# Patient Record
Sex: Male | Born: 1940 | Race: Black or African American | Hispanic: No | Smoking: Never smoker
Health system: Southern US, Community
[De-identification: ages and names within clinical notes are randomized; demographics above are authoritative.]

## PROBLEM LIST (undated history)

## (undated) DIAGNOSIS — I639 Cerebral infarction, unspecified: Secondary | ICD-10-CM

## (undated) DIAGNOSIS — I10 Essential (primary) hypertension: Secondary | ICD-10-CM

## (undated) DIAGNOSIS — R569 Unspecified convulsions: Secondary | ICD-10-CM

## (undated) DIAGNOSIS — E785 Hyperlipidemia, unspecified: Secondary | ICD-10-CM

## (undated) DIAGNOSIS — I499 Cardiac arrhythmia, unspecified: Secondary | ICD-10-CM

## (undated) DIAGNOSIS — E119 Type 2 diabetes mellitus without complications: Secondary | ICD-10-CM

## (undated) HISTORY — PX: CORONARY ANGIOPLASTY WITH STENT PLACEMENT: SHX49

## (undated) HISTORY — PX: LOOP RECORDER IMPLANT: SHX5954

---

## 1998-09-30 ENCOUNTER — Encounter: Payer: Self-pay | Admitting: Neurosurgery

## 1998-09-30 ENCOUNTER — Ambulatory Visit (HOSPITAL_COMMUNITY): Admission: RE | Admit: 1998-09-30 | Discharge: 1998-09-30 | Payer: Self-pay | Admitting: Neurosurgery

## 1998-11-21 ENCOUNTER — Encounter: Payer: Self-pay | Admitting: Neurosurgery

## 1998-11-25 ENCOUNTER — Encounter: Payer: Self-pay | Admitting: Neurosurgery

## 1998-11-25 ENCOUNTER — Inpatient Hospital Stay (HOSPITAL_COMMUNITY): Admission: RE | Admit: 1998-11-25 | Discharge: 1998-11-26 | Payer: Self-pay | Admitting: Neurosurgery

## 1999-12-12 ENCOUNTER — Encounter: Payer: Self-pay | Admitting: Neurosurgery

## 1999-12-12 ENCOUNTER — Encounter: Admission: RE | Admit: 1999-12-12 | Discharge: 1999-12-12 | Payer: Self-pay | Admitting: Neurosurgery

## 2020-11-19 ENCOUNTER — Other Ambulatory Visit: Payer: Self-pay

## 2020-11-19 ENCOUNTER — Emergency Department (HOSPITAL_BASED_OUTPATIENT_CLINIC_OR_DEPARTMENT_OTHER): Payer: Medicare Other

## 2020-11-19 ENCOUNTER — Inpatient Hospital Stay (HOSPITAL_BASED_OUTPATIENT_CLINIC_OR_DEPARTMENT_OTHER)
Admission: EM | Admit: 2020-11-19 | Discharge: 2020-11-25 | DRG: 309 | Disposition: A | Discharge status: Home / Self Care | Payer: Medicare Other | Attending: Internal Medicine | Admitting: Internal Medicine

## 2020-11-19 ENCOUNTER — Encounter (HOSPITAL_BASED_OUTPATIENT_CLINIC_OR_DEPARTMENT_OTHER): Payer: Self-pay | Admitting: *Deleted

## 2020-11-19 DIAGNOSIS — R072 Precordial pain: Secondary | ICD-10-CM

## 2020-11-19 DIAGNOSIS — Z981 Arthrodesis status: Secondary | ICD-10-CM

## 2020-11-19 DIAGNOSIS — E1165 Type 2 diabetes mellitus with hyperglycemia: Secondary | ICD-10-CM | POA: Diagnosis present

## 2020-11-19 DIAGNOSIS — Z7984 Long term (current) use of oral hypoglycemic drugs: Secondary | ICD-10-CM

## 2020-11-19 DIAGNOSIS — Z20822 Contact with and (suspected) exposure to covid-19: Secondary | ICD-10-CM | POA: Diagnosis present

## 2020-11-19 DIAGNOSIS — E785 Hyperlipidemia, unspecified: Secondary | ICD-10-CM | POA: Diagnosis present

## 2020-11-19 DIAGNOSIS — I509 Heart failure, unspecified: Secondary | ICD-10-CM | POA: Insufficient documentation

## 2020-11-19 DIAGNOSIS — Z7901 Long term (current) use of anticoagulants: Secondary | ICD-10-CM

## 2020-11-19 DIAGNOSIS — I4892 Unspecified atrial flutter: Secondary | ICD-10-CM | POA: Diagnosis present

## 2020-11-19 DIAGNOSIS — I129 Hypertensive chronic kidney disease with stage 1 through stage 4 chronic kidney disease, or unspecified chronic kidney disease: Secondary | ICD-10-CM | POA: Diagnosis present

## 2020-11-19 DIAGNOSIS — E1122 Type 2 diabetes mellitus with diabetic chronic kidney disease: Secondary | ICD-10-CM | POA: Diagnosis present

## 2020-11-19 DIAGNOSIS — E119 Type 2 diabetes mellitus without complications: Secondary | ICD-10-CM

## 2020-11-19 DIAGNOSIS — Z79899 Other long term (current) drug therapy: Secondary | ICD-10-CM

## 2020-11-19 DIAGNOSIS — G40909 Epilepsy, unspecified, not intractable, without status epilepticus: Secondary | ICD-10-CM | POA: Diagnosis present

## 2020-11-19 DIAGNOSIS — I484 Atypical atrial flutter: Secondary | ICD-10-CM | POA: Diagnosis not present

## 2020-11-19 DIAGNOSIS — D631 Anemia in chronic kidney disease: Secondary | ICD-10-CM | POA: Diagnosis present

## 2020-11-19 DIAGNOSIS — Z955 Presence of coronary angioplasty implant and graft: Secondary | ICD-10-CM

## 2020-11-19 DIAGNOSIS — I4891 Unspecified atrial fibrillation: Secondary | ICD-10-CM

## 2020-11-19 DIAGNOSIS — N1831 Chronic kidney disease, stage 3a: Secondary | ICD-10-CM | POA: Diagnosis present

## 2020-11-19 DIAGNOSIS — R569 Unspecified convulsions: Secondary | ICD-10-CM

## 2020-11-19 DIAGNOSIS — I2510 Atherosclerotic heart disease of native coronary artery without angina pectoris: Secondary | ICD-10-CM

## 2020-11-19 DIAGNOSIS — Z8673 Personal history of transient ischemic attack (TIA), and cerebral infarction without residual deficits: Secondary | ICD-10-CM

## 2020-11-19 DIAGNOSIS — Q211 Atrial septal defect: Secondary | ICD-10-CM

## 2020-11-19 DIAGNOSIS — Z87891 Personal history of nicotine dependence: Secondary | ICD-10-CM

## 2020-11-19 DIAGNOSIS — I872 Venous insufficiency (chronic) (peripheral): Secondary | ICD-10-CM | POA: Diagnosis present

## 2020-11-19 DIAGNOSIS — I10 Essential (primary) hypertension: Secondary | ICD-10-CM | POA: Diagnosis present

## 2020-11-19 DIAGNOSIS — N400 Enlarged prostate without lower urinary tract symptoms: Secondary | ICD-10-CM | POA: Diagnosis present

## 2020-11-19 HISTORY — DX: Essential (primary) hypertension: I10

## 2020-11-19 HISTORY — DX: Hyperlipidemia, unspecified: E78.5

## 2020-11-19 HISTORY — DX: Unspecified convulsions: R56.9

## 2020-11-19 HISTORY — DX: Cerebral infarction, unspecified: I63.9

## 2020-11-19 HISTORY — DX: Type 2 diabetes mellitus without complications: E11.9

## 2020-11-19 HISTORY — DX: Cardiac arrhythmia, unspecified: I49.9

## 2020-11-19 LAB — URINALYSIS, ROUTINE W REFLEX MICROSCOPIC
Bilirubin Urine: NEGATIVE
Glucose, UA: >=500 mg/dL — AB
Hgb urine dipstick: NEGATIVE
Ketones, ur: NEGATIVE mg/dL
Leukocytes,Ua: NEGATIVE
Nitrite: NEGATIVE
Protein, ur: NEGATIVE mg/dL
Specific Gravity, Urine: 1.02 (ref 1.005–1.030)
pH: 5.5 (ref 5.0–8.0)

## 2020-11-19 LAB — HEPATIC FUNCTION PANEL
ALT: 29 U/L (ref 0–44)
AST: 30 U/L (ref 15–41)
Albumin: 3.4 g/dL — ABNORMAL LOW (ref 3.5–5.0)
Alkaline Phosphatase: 132 U/L — ABNORMAL HIGH (ref 38–126)
Bilirubin, Direct: 0.2 mg/dL (ref 0.0–0.2)
Indirect Bilirubin: 0.8 mg/dL (ref 0.3–0.9)
Total Bilirubin: 1 mg/dL (ref 0.3–1.2)
Total Protein: 7.3 g/dL (ref 6.5–8.1)

## 2020-11-19 LAB — PROTIME-INR
INR: 1.1 (ref 0.8–1.2)
Prothrombin Time: 14 seconds (ref 11.4–15.2)

## 2020-11-19 LAB — URINALYSIS, MICROSCOPIC (REFLEX)

## 2020-11-19 LAB — CBC
HCT: 33.7 % — ABNORMAL LOW (ref 39.0–52.0)
Hemoglobin: 11.6 g/dL — ABNORMAL LOW (ref 13.0–17.0)
MCH: 32 pg (ref 26.0–34.0)
MCHC: 34.4 g/dL (ref 30.0–36.0)
MCV: 92.8 fL (ref 80.0–100.0)
Platelets: 386 10*3/uL (ref 150–400)
RBC: 3.63 MIL/uL — ABNORMAL LOW (ref 4.22–5.81)
RDW: 13.3 % (ref 11.5–15.5)
WBC: 19.3 10*3/uL — ABNORMAL HIGH (ref 4.0–10.5)
nRBC: 0 % (ref 0.0–0.2)

## 2020-11-19 LAB — TROPONIN I (HIGH SENSITIVITY)
Troponin I (High Sensitivity): 46 ng/L — ABNORMAL HIGH (ref <18)
Troponin I (High Sensitivity): 48 ng/L — ABNORMAL HIGH (ref <18)

## 2020-11-19 LAB — BASIC METABOLIC PANEL
Anion gap: 12 (ref 5–15)
BUN: 19 mg/dL (ref 8–23)
CO2: 22 mmol/L (ref 22–32)
Calcium: 9.5 mg/dL (ref 8.9–10.3)
Chloride: 100 mmol/L (ref 98–111)
Creatinine, Ser: 1.53 mg/dL — ABNORMAL HIGH (ref 0.61–1.24)
GFR, Estimated: 45 mL/min — ABNORMAL LOW (ref >60)
Glucose, Bld: 280 mg/dL — ABNORMAL HIGH (ref 70–99)
Potassium: 3.6 mmol/L (ref 3.5–5.1)
Sodium: 134 mmol/L — ABNORMAL LOW (ref 135–145)

## 2020-11-19 LAB — TSH: TSH: 2.299 u[IU]/mL (ref 0.350–4.500)

## 2020-11-19 LAB — RESP PANEL BY RT-PCR (FLU A&B, COVID) ARPGX2
Influenza A by PCR: NEGATIVE
Influenza B by PCR: NEGATIVE
SARS Coronavirus 2 by RT PCR: NEGATIVE

## 2020-11-19 LAB — LIPASE, BLOOD: Lipase: 36 U/L (ref 11–51)

## 2020-11-19 LAB — BRAIN NATRIURETIC PEPTIDE: B Natriuretic Peptide: 897.2 pg/mL — ABNORMAL HIGH (ref 0.0–100.0)

## 2020-11-19 LAB — LACTIC ACID, PLASMA
Lactic Acid, Venous: 1.7 mmol/L (ref 0.5–1.9)
Lactic Acid, Venous: 1.6 mmol/L (ref 0.5–1.9)

## 2020-11-19 MED ORDER — NITROGLYCERIN 2 % TD OINT
1.0000 [in_us] | TOPICAL_OINTMENT | Freq: Four times a day (QID) | TRANSDERMAL | Status: DC
  Administered 2020-11-19 – 2020-11-23 (×12): 1 [in_us] via TOPICAL
  Filled 2020-11-19: qty 1
  Filled 2020-11-19: qty 30

## 2020-11-19 MED ORDER — FUROSEMIDE 10 MG/ML IJ SOLN
40.0000 mg | Freq: Once | INTRAMUSCULAR | Status: AC
  Administered 2020-11-19: 40 mg via INTRAVENOUS
  Filled 2020-11-19: qty 4

## 2020-11-19 NOTE — ED Triage Notes (Signed)
He had a seizure in February while in [place]. He also had an irregular heart beat. He had a 'loop'. The loop was hooked up for the first time today. The company told him his heart beat was irregular and he needed to come to the ER. He has had fatigue x 2 days.

## 2020-11-19 NOTE — ED Provider Notes (Signed)
Phillip Oconnor Note   CSN: [id number] Arrival date & time: 11/19/20  1445     History Chief Complaint  Patient presents with  . Fatigue  . Irregular Heart Beat    Phillip Oconnor is a 80 y.o. male.  HPI Patient just got a loop recorder activated that has actually gotten placed 3 months ago.  He got a call today telling him that heart was doing irregular things and he was to get medical care.  Patient had rhythm consistent with atrial fibrillation.  He does not know of any history of this.  He is not on anticoagulants.  Patient reports that he had a 15-day hospitalization in [place] in February.  He was visiting some friends and had a seizure.  He does not know know what the seizure was ever attributed to.  He denied prior history of seizures.  He reports by the time he finally left the hospital however they had placed this loop recorder but it was not activated until day before yesterday.  He reports he had no instructions on what to do with that and so finally he called and he was walked-through starting it up.  He reports has been feeling pretty good otherwise.  He reports he gets short of breath fairly easily but that is not unusual for him.  He reports he fatigues as well.  He reports his legs swell over the course of the day.  He reports they go down when he sleeps at night.  He reports in the morning he does not have any foot swelling.  He does wear his compression hose.  He denies any experiences any pain in his legs or feet.  He reports this has been a chronic problem.    Past Medical History:  Diagnosis Date  . Irregular heart beat   . Seizures ([place])     There are no problems to display for this patient.   Past Surgical History:  Procedure Laterality Date  . CORONARY ANGIOPLASTY WITH STENT PLACEMENT    . LOOP RECORDER IMPLANT         No family history on file.  Social History   Tobacco Use  . Smoking status: Never Smoker  .  Smokeless tobacco: Never Used  Substance Use Topics  . Alcohol use: Never  . Drug use: Never    Home Medications Prior to Admission medications   Medication Sig Start Date End Date Taking? Authorizing Oconnor  aspirin 325 MG tablet Take 1 tablet by mouth daily. 10/29/20  Yes Oconnor, Historical, MD  atorvastatin (LIPITOR) 40 MG tablet Take 1 tablet by mouth daily. 10/29/20  Yes Oconnor, Historical, MD  doxazosin (CARDURA) 4 MG tablet Take by mouth. 10/29/20  Yes Oconnor, Historical, MD  levETIRAcetam (KEPPRA) 500 MG tablet Take by mouth. 10/29/20  Yes Oconnor, Historical, MD  linagliptin (TRADJENTA) 5 MG TABS tablet Take 1 tablet by mouth daily. 11/01/20  Yes Oconnor, Historical, MD  losartan (COZAAR) 100 MG tablet Take 1 tablet by mouth daily. 10/29/20  Yes Oconnor, Historical, MD  metFORMIN (GLUCOPHAGE) 500 MG tablet Take by mouth. 10/29/20  Yes Oconnor, Historical, MD  NIFEdipine (PROCARDIA XL/NIFEDICAL-XL) 90 MG 24 hr tablet Take by mouth. 10/29/20  Yes Oconnor, Historical, MD  hydrALAZINE (APRESOLINE) 100 MG tablet Take by mouth. 10/29/20   Oconnor, Historical, MD    Allergies    Penicillins and Plavix [clopidogrel]  Review of Systems   Review of Systems 10 systems reviewed and negative  except as per HPI Physical Exam Updated Vital Signs BP (!) 164/73 (BP Location: Left Arm)   Pulse (!) 56   Temp 98.9 F (37.2 C) (Oral)   Resp 20   Ht 6\' 4"  (1.93 m)   Wt 98.3 kg   SpO2 97%   BMI 26.38 kg/m   Physical Exam Constitutional:      Comments: Alert nontoxic.  Clear mental status.  No respiratory distress at rest  HENT:     Head: Normocephalic and atraumatic.     Mouth/Throat:     Mouth: Mucous membranes are moist.     Pharynx: Oropharynx is clear.  Eyes:     Extraocular Movements: Extraocular movements intact.     Conjunctiva/sclera: Conjunctivae normal.     Pupils: Pupils are equal, round, and reactive to light.  Cardiovascular:     Comments: Heart rate is  irregular, borderline bradycardia Pulmonary:     Effort: Pulmonary effort is normal.     Breath sounds: Normal breath sounds.  Abdominal:     General: There is no distension.     Palpations: Abdomen is soft.     Tenderness: There is no abdominal tenderness. There is no guarding.  Musculoskeletal:     Comments: 2+ pitting edema bilateral lower extremities.  Nontender.  Skin:    General: Skin is warm and dry.  Neurological:     General: No focal deficit present.     Mental Status: He is oriented to person, place, and time.     Motor: No weakness.     Coordination: Coordination normal.  Psychiatric:        Mood and Affect: Mood normal.     ED Results / Procedures / Treatments   Labs (all labs ordered are listed, but only abnormal results are displayed) Labs Reviewed  BASIC METABOLIC PANEL - Abnormal; Notable for the following components:      Result Value   Sodium 134 (*)    Glucose, Bld 280 (*)    Creatinine, Ser 1.53 (*)    GFR, Estimated 45 (*)    All other components within normal limits  CBC - Abnormal; Notable for the following components:   WBC 19.3 (*)    RBC 3.63 (*)    Hemoglobin 11.6 (*)    HCT 33.7 (*)    All other components within normal limits  RESP PANEL BY RT-PCR (FLU A&B, COVID) ARPGX2  PROTIME-INR  TSH  URINALYSIS, ROUTINE W REFLEX MICROSCOPIC  LACTIC ACID, PLASMA  LACTIC ACID, PLASMA  BRAIN NATRIURETIC PEPTIDE  HEPATIC FUNCTION PANEL  LIPASE, BLOOD  TROPONIN I (HIGH SENSITIVITY)    EKG EKG Interpretation  Date/Time:  Tuesday November 19 2020 15:06:52 EDT Ventricular Rate:  65 PR Interval:    QRS Duration: 120 QT Interval:  482 QTC Calculation: 501 R Axis:   -79 Text Interpretation: Atrial flutter with variable A-V block Pulmonary disease pattern Right bundle branch block Left anterior fascicular block Abnormal ECG Confirmed by Charlesetta Shanks [phone]) on 11/19/2020 4:32:50 PM   Radiology No results found.  Procedures Procedures  CRITICAL  CARE Performed by: Charlesetta Shanks   Total critical care time: 33  minutes  Critical care time was exclusive of separately billable procedures and treating other patients.  Critical care was necessary to treat or prevent imminent or life-threatening deterioration.  Critical care was time spent personally by me on the following activities: development of treatment plan with patient and/or surrogate as well as nursing, discussions with consultants, evaluation of  patient's response to treatment, examination of patient, obtaining history from patient or surrogate, ordering and performing treatments and interventions, ordering and review of laboratory studies, ordering and review of radiographic studies, pulse oximetry and re-evaluation of patient's condition. Medications Ordered in ED Medications - No data to display  ED Course  I have reviewed the triage vital signs and the nursing notes.  Pertinent labs & imaging results that were available during my care of the patient were reviewed by me and considered in my medical decision making (see chart for details).    MDM Rules/Calculators/A&P                         Consult: Reviewed with cardiology Dr. Virgina Jock.  We reviewed patient's history of admission in [place] for seizure with unclear etiology.  At this time plan will be to do CT head and rule out any acute findings on CT head.  Will then proceed with heparin and admission to hospitalist service.  Cardiology will do consultation to further manage new onset atrial fibrillation and CHF.  Patient presents as outlined above.  He is alert.  He has mild increased work of breathing.  Chest x-ray shows effusions consistent with CHF.  He also has peripheral edema.  At this time, findings consistent with acute CHF.  Patient denies any known history of CHF.  Unclear chronicity of the atrial fibrillation.  From history this is a new diagnosis for the patient.  We will proceed with Lasix, heparin and  rate and blood pressure control as needed.  Final Clinical Impression(s) / ED Diagnoses Final diagnoses:  Atrial fibrillation, unspecified type ([place])  Acute congestive heart failure, unspecified heart failure type [facility])    Rx / [place] Orders ED Discharge Orders    None       Charlesetta Shanks, MD 11/27/20 1248

## 2020-11-20 ENCOUNTER — Other Ambulatory Visit (HOSPITAL_COMMUNITY): Payer: Self-pay

## 2020-11-20 ENCOUNTER — Observation Stay (HOSPITAL_COMMUNITY): Payer: Medicare Other

## 2020-11-20 ENCOUNTER — Encounter (HOSPITAL_BASED_OUTPATIENT_CLINIC_OR_DEPARTMENT_OTHER): Payer: Self-pay | Admitting: Internal Medicine

## 2020-11-20 DIAGNOSIS — Z981 Arthrodesis status: Secondary | ICD-10-CM | POA: Diagnosis not present

## 2020-11-20 DIAGNOSIS — I10 Essential (primary) hypertension: Secondary | ICD-10-CM | POA: Diagnosis present

## 2020-11-20 DIAGNOSIS — I4891 Unspecified atrial fibrillation: Secondary | ICD-10-CM

## 2020-11-20 DIAGNOSIS — I4892 Unspecified atrial flutter: Secondary | ICD-10-CM | POA: Diagnosis not present

## 2020-11-20 DIAGNOSIS — I509 Heart failure, unspecified: Secondary | ICD-10-CM

## 2020-11-20 DIAGNOSIS — E1159 Type 2 diabetes mellitus with other circulatory complications: Secondary | ICD-10-CM | POA: Diagnosis not present

## 2020-11-20 DIAGNOSIS — R569 Unspecified convulsions: Secondary | ICD-10-CM

## 2020-11-20 DIAGNOSIS — Z7984 Long term (current) use of oral hypoglycemic drugs: Secondary | ICD-10-CM | POA: Diagnosis not present

## 2020-11-20 DIAGNOSIS — Q211 Atrial septal defect: Secondary | ICD-10-CM | POA: Diagnosis not present

## 2020-11-20 DIAGNOSIS — E1165 Type 2 diabetes mellitus with hyperglycemia: Secondary | ICD-10-CM | POA: Diagnosis present

## 2020-11-20 DIAGNOSIS — E119 Type 2 diabetes mellitus without complications: Secondary | ICD-10-CM

## 2020-11-20 DIAGNOSIS — I2510 Atherosclerotic heart disease of native coronary artery without angina pectoris: Secondary | ICD-10-CM | POA: Diagnosis present

## 2020-11-20 DIAGNOSIS — Z8673 Personal history of transient ischemic attack (TIA), and cerebral infarction without residual deficits: Secondary | ICD-10-CM | POA: Diagnosis not present

## 2020-11-20 DIAGNOSIS — I872 Venous insufficiency (chronic) (peripheral): Secondary | ICD-10-CM | POA: Diagnosis present

## 2020-11-20 DIAGNOSIS — E785 Hyperlipidemia, unspecified: Secondary | ICD-10-CM | POA: Diagnosis present

## 2020-11-20 DIAGNOSIS — I129 Hypertensive chronic kidney disease with stage 1 through stage 4 chronic kidney disease, or unspecified chronic kidney disease: Secondary | ICD-10-CM | POA: Diagnosis present

## 2020-11-20 DIAGNOSIS — Z79899 Other long term (current) drug therapy: Secondary | ICD-10-CM | POA: Diagnosis not present

## 2020-11-20 DIAGNOSIS — N1831 Chronic kidney disease, stage 3a: Secondary | ICD-10-CM | POA: Diagnosis present

## 2020-11-20 DIAGNOSIS — I484 Atypical atrial flutter: Secondary | ICD-10-CM | POA: Diagnosis present

## 2020-11-20 DIAGNOSIS — D631 Anemia in chronic kidney disease: Secondary | ICD-10-CM | POA: Diagnosis present

## 2020-11-20 DIAGNOSIS — Z20822 Contact with and (suspected) exposure to covid-19: Secondary | ICD-10-CM | POA: Diagnosis present

## 2020-11-20 DIAGNOSIS — E1122 Type 2 diabetes mellitus with diabetic chronic kidney disease: Secondary | ICD-10-CM | POA: Diagnosis present

## 2020-11-20 DIAGNOSIS — G40909 Epilepsy, unspecified, not intractable, without status epilepticus: Secondary | ICD-10-CM | POA: Diagnosis present

## 2020-11-20 DIAGNOSIS — N400 Enlarged prostate without lower urinary tract symptoms: Secondary | ICD-10-CM | POA: Diagnosis present

## 2020-11-20 DIAGNOSIS — Z87891 Personal history of nicotine dependence: Secondary | ICD-10-CM | POA: Diagnosis not present

## 2020-11-20 DIAGNOSIS — Z955 Presence of coronary angioplasty implant and graft: Secondary | ICD-10-CM | POA: Diagnosis not present

## 2020-11-20 LAB — CBC WITH DIFFERENTIAL/PLATELET
Abs Immature Granulocytes: 0.09 10*3/uL — ABNORMAL HIGH (ref 0.00–0.07)
Basophils Absolute: 0 10*3/uL (ref 0.0–0.1)
Basophils Relative: 0 %
Eosinophils Absolute: 0.1 10*3/uL (ref 0.0–0.5)
Eosinophils Relative: 0 %
HCT: 28.4 % — ABNORMAL LOW (ref 39.0–52.0)
Hemoglobin: 9.6 g/dL — ABNORMAL LOW (ref 13.0–17.0)
Immature Granulocytes: 1 %
Lymphocytes Relative: 8 %
Lymphs Abs: 1.3 10*3/uL (ref 0.7–4.0)
MCH: 31.3 pg (ref 26.0–34.0)
MCHC: 33.8 g/dL (ref 30.0–36.0)
MCV: 92.5 fL (ref 80.0–100.0)
Monocytes Absolute: 1.4 10*3/uL — ABNORMAL HIGH (ref 0.1–1.0)
Monocytes Relative: 8 %
Neutro Abs: 14.3 10*3/uL — ABNORMAL HIGH (ref 1.7–7.7)
Neutrophils Relative %: 83 %
Platelets: 349 10*3/uL (ref 150–400)
RBC: 3.07 MIL/uL — ABNORMAL LOW (ref 4.22–5.81)
RDW: 13.2 % (ref 11.5–15.5)
WBC: 17.2 10*3/uL — ABNORMAL HIGH (ref 4.0–10.5)
nRBC: 0 % (ref 0.0–0.2)

## 2020-11-20 LAB — COMPREHENSIVE METABOLIC PANEL
ALT: 21 U/L (ref 0–44)
AST: 20 U/L (ref 15–41)
Albumin: 2.6 g/dL — ABNORMAL LOW (ref 3.5–5.0)
Alkaline Phosphatase: 100 U/L (ref 38–126)
Anion gap: 10 (ref 5–15)
BUN: 13 mg/dL (ref 8–23)
CO2: 25 mmol/L (ref 22–32)
Calcium: 8.9 mg/dL (ref 8.9–10.3)
Chloride: 100 mmol/L (ref 98–111)
Creatinine, Ser: 1.42 mg/dL — ABNORMAL HIGH (ref 0.61–1.24)
GFR, Estimated: 50 mL/min — ABNORMAL LOW (ref >60)
Glucose, Bld: 250 mg/dL — ABNORMAL HIGH (ref 70–99)
Potassium: 3.4 mmol/L — ABNORMAL LOW (ref 3.5–5.1)
Sodium: 135 mmol/L (ref 135–145)
Total Bilirubin: 1 mg/dL (ref 0.3–1.2)
Total Protein: 5.6 g/dL — ABNORMAL LOW (ref 6.5–8.1)

## 2020-11-20 LAB — ECHOCARDIOGRAM COMPLETE
AR max vel: 2.56 cm2
AV Area VTI: 2.49 cm2
AV Area mean vel: 2.35 cm2
AV Mean grad: 3 mmHg
AV Peak grad: 5.8 mmHg
Ao pk vel: 1.2 m/s
Area-P 1/2: 4.21 cm2
Calc EF: 74.5 %
Height: 76 in
S' Lateral: 3 cm
Single Plane A2C EF: 72.2 %
Single Plane A4C EF: 76.6 %
Weight: 3291.03 oz

## 2020-11-20 LAB — HEMOGLOBIN A1C
Hgb A1c MFr Bld: 9.3 % — ABNORMAL HIGH (ref 4.8–5.6)
Mean Plasma Glucose: 220.21 mg/dL

## 2020-11-20 LAB — MAGNESIUM: Magnesium: 1.4 mg/dL — ABNORMAL LOW (ref 1.7–2.4)

## 2020-11-20 LAB — HEMOGLOBIN AND HEMATOCRIT, BLOOD
HCT: 28.4 % — ABNORMAL LOW (ref 39.0–52.0)
Hemoglobin: 9.8 g/dL — ABNORMAL LOW (ref 13.0–17.0)

## 2020-11-20 LAB — GLUCOSE, CAPILLARY
Glucose-Capillary: 230 mg/dL — ABNORMAL HIGH (ref 70–99)
Glucose-Capillary: 195 mg/dL — ABNORMAL HIGH (ref 70–99)
Glucose-Capillary: 293 mg/dL — ABNORMAL HIGH (ref 70–99)
Glucose-Capillary: 249 mg/dL — ABNORMAL HIGH (ref 70–99)

## 2020-11-20 LAB — HEPARIN LEVEL (UNFRACTIONATED)
Heparin Unfractionated: 0.27 IU/mL — ABNORMAL LOW (ref 0.30–0.70)
Heparin Unfractionated: 0.53 IU/mL (ref 0.30–0.70)

## 2020-11-20 MED ORDER — DOXAZOSIN MESYLATE 4 MG PO TABS
4.0000 mg | ORAL_TABLET | Freq: Every day | ORAL | Status: DC
  Administered 2020-11-20 – 2020-11-25 (×6): 4 mg via ORAL
  Filled 2020-11-20 (×6): qty 1

## 2020-11-20 MED ORDER — INSULIN ASPART 100 UNIT/ML ~~LOC~~ SOLN
0.0000 [IU] | Freq: Three times a day (TID) | SUBCUTANEOUS | Status: DC
  Administered 2020-11-20: 5 [IU] via SUBCUTANEOUS
  Administered 2020-11-20: 8 [IU] via SUBCUTANEOUS
  Administered 2020-11-20 – 2020-11-21 (×4): 3 [IU] via SUBCUTANEOUS
  Administered 2020-11-22: 2 [IU] via SUBCUTANEOUS
  Administered 2020-11-22: 3 [IU] via SUBCUTANEOUS
  Administered 2020-11-23: 2 [IU] via SUBCUTANEOUS
  Administered 2020-11-24: 5 [IU] via SUBCUTANEOUS
  Administered 2020-11-24: 3 [IU] via SUBCUTANEOUS
  Administered 2020-11-24: 8 [IU] via SUBCUTANEOUS
  Administered 2020-11-25 (×2): 3 [IU] via SUBCUTANEOUS

## 2020-11-20 MED ORDER — ATORVASTATIN CALCIUM 40 MG PO TABS
40.0000 mg | ORAL_TABLET | Freq: Every day | ORAL | Status: DC
  Filled 2020-11-20: qty 1

## 2020-11-20 MED ORDER — LEVETIRACETAM 500 MG PO TABS
500.0000 mg | ORAL_TABLET | Freq: Two times a day (BID) | ORAL | Status: DC
  Administered 2020-11-20 – 2020-11-25 (×12): 500 mg via ORAL
  Filled 2020-11-20 (×12): qty 1

## 2020-11-20 MED ORDER — MAGNESIUM SULFATE 4 GM/100ML IV SOLN: 4.0000 g | Freq: Once | INTRAVENOUS | Status: DC

## 2020-11-20 MED ORDER — ONDANSETRON HCL 4 MG/2ML IJ SOLN
4.0000 mg | Freq: Four times a day (QID) | INTRAMUSCULAR | Status: DC | PRN
Start: 2020-11-20 — End: 2020-11-25

## 2020-11-20 MED ORDER — POTASSIUM CHLORIDE CRYS ER 20 MEQ PO TBCR
40.0000 meq | EXTENDED_RELEASE_TABLET | Freq: Once | ORAL | Status: DC
  Filled 2020-11-20: qty 2

## 2020-11-20 MED ORDER — MAGNESIUM SULFATE 2 GM/50ML IV SOLN
2.0000 g | Freq: Once | INTRAVENOUS | Status: AC
  Administered 2020-11-20: 2 g via INTRAVENOUS
  Filled 2020-11-20: qty 50

## 2020-11-20 MED ORDER — NIFEDIPINE ER OSMOTIC RELEASE 90 MG PO TB24
90.0000 mg | ORAL_TABLET | Freq: Every day | ORAL | Status: DC
  Administered 2020-11-20: 90 mg via ORAL
  Filled 2020-11-20: qty 1

## 2020-11-20 MED ORDER — FUROSEMIDE 10 MG/ML IJ SOLN: 40.0000 mg | Freq: Every day | INTRAMUSCULAR | Status: DC

## 2020-11-20 MED ORDER — ACETAMINOPHEN 325 MG PO TABS: 650.0000 mg | ORAL_TABLET | ORAL | Status: DC | PRN

## 2020-11-20 MED ORDER — METOPROLOL SUCCINATE ER 25 MG PO TB24: 25.0000 mg | ORAL_TABLET | Freq: Every day | ORAL | Status: DC

## 2020-11-20 MED ORDER — HYDRALAZINE HCL 20 MG/ML IJ SOLN
10.0000 mg | INTRAMUSCULAR | Status: DC | PRN
  Administered 2020-11-20: 20 mg via INTRAVENOUS
  Filled 2020-11-20: qty 1

## 2020-11-20 MED ORDER — LOSARTAN POTASSIUM 50 MG PO TABS
100.0000 mg | ORAL_TABLET | Freq: Every day | ORAL | Status: DC
  Administered 2020-11-20: 100 mg via ORAL
  Filled 2020-11-20: qty 2

## 2020-11-20 MED ORDER — HEPARIN (PORCINE) 25000 UT/250ML-% IV SOLN
1550.0000 [IU]/h | INTRAVENOUS | Status: DC
  Administered 2020-11-20: 1500 [IU]/h via INTRAVENOUS
  Administered 2020-11-21 – 2020-11-22 (×2): 1750 [IU]/h via INTRAVENOUS
  Filled 2020-11-20 (×4): qty 250

## 2020-11-20 MED ORDER — HEPARIN BOLUS VIA INFUSION
3000.0000 [IU] | Freq: Once | INTRAVENOUS | Status: AC
  Administered 2020-11-20: 3000 [IU] via INTRAVENOUS
  Filled 2020-11-20: qty 3000

## 2020-11-20 MED ORDER — ATORVASTATIN CALCIUM 80 MG PO TABS
80.0000 mg | ORAL_TABLET | Freq: Every day | ORAL | Status: DC
  Administered 2020-11-20 – 2020-11-25 (×6): 80 mg via ORAL
  Filled 2020-11-20 (×6): qty 1

## 2020-11-20 NOTE — Progress Notes (Signed)
Phillip Oconnor is a 80 y.o. male with medical history significant of DM2, HTN, HLD. Presented to [facility] ED after receiving a call that his loop recorder was showing A. fib and that he needed to come to the hospital.  He drove himself to [facility].  He had recently been treated for urinary tract infection, completed his 10-day course of oral antibiotics on Saturday, 11/17/2020, was otherwise in his usual state of health.  Upon presentation to the ED, twelve-lead EKG showing a flutter, BNP elevated 800, small bilateral pleural effusions on chest x-ray.  Received 1 dose of IV Lasix 40 mg.  Cardiology was consulted by EDP.  Hospitalist team asked to admit.  11/20/20: [name]  He denies having any palpitations or chest pain or shortness of breath while at rest.  He is currently on heparin drip.  Rate is controlled.  Please refer to H&P dictated by my partner Dr. Alcario Drought on 11/20/2020 for further details of the assessment and plan.

## 2020-11-20 NOTE — Progress Notes (Signed)
ANTICOAGULATION CONSULT NOTE - Initial Consult  Pharmacy Consult for heparin Indication: atrial fibrillation  Allergies  Allergen Reactions  . Penicillins Rash and Swelling    Lips swell  . Plavix [Clopidogrel]     unknown    Patient Measurements: Height: 6\' 4"  (193 cm) Weight: 98.3 kg (216 lb 11.4 oz) IBW/kg (Calculated) : 86.8  Vital Signs: Temp: 97.9 F (36.6 C) (04/06 0143) Temp Source: Oral (04/06 0143) BP: 166/87 (04/06 0051) Pulse Rate: 67 (04/06 0143)  Labs: Recent Labs    11/19/20 1652 11/19/20 1930  HGB 11.6*  --   HCT 33.7*  --   PLT 386  --   LABPROT 14.0  --   INR 1.1  --   CREATININE 1.53*  --   TROPONINIHS 46* 48*    Estimated Creatinine Clearance: 46.5 mL/min (A) (by C-G formula based on SCr of 1.53 mg/dL (H)).   Medical History: Past Medical History:  Diagnosis Date  . DM2 (diabetes mellitus, type 2) ([place])   . HLD (hyperlipidemia)   . HTN (hypertension)   . Irregular heart beat   . Seizures ([place])   . Stroke [facility])     Medications:  Medications Prior to Admission  Medication Sig Dispense Refill Last Dose  . atorvastatin (LIPITOR) 40 MG tablet Take 1 tablet by mouth daily.   11/19/2020 at Unknown time  . doxazosin (CARDURA) 4 MG tablet Take by mouth.   11/19/2020 at Unknown time  . levETIRAcetam (KEPPRA) 500 MG tablet Take by mouth.   Past Week at Unknown time  . linagliptin (TRADJENTA) 5 MG TABS tablet Take 1 tablet by mouth daily.     Marland Kitchen losartan (COZAAR) 100 MG tablet Take 1 tablet by mouth daily.     . metFORMIN (GLUCOPHAGE) 500 MG tablet Take by mouth.   11/19/2020 at Unknown time  . NIFEdipine (PROCARDIA XL/NIFEDICAL-XL) 90 MG 24 hr tablet Take by mouth.   11/18/2020 at Unknown time  . hydrALAZINE (APRESOLINE) 100 MG tablet Take by mouth.   Unknown at Unknown time   Scheduled:  . atorvastatin  40 mg Oral Daily  . doxazosin  4 mg Oral Daily  . furosemide  40 mg Intravenous Daily  . insulin aspart  0-15 Units Subcutaneous TID WC  .  levETIRAcetam  500 mg Oral BID  . losartan  100 mg Oral Daily  . NIFEdipine  90 mg Oral Daily  . nitroGLYCERIN  1 inch Topical Q6H    Assessment: 80yo male had loop recorder implanted during hospitalization for sz and had it activated recently, was told by recorder company to seek care for irregular heart rhythm, found to be in Afib, no known h/o Afib, to begin heparin.  Goal of Therapy:  Heparin level 0.3-0.7 units/ml Monitor platelets by anticoagulation protocol: Yes   Plan:  Will give heparin 3000 units IV bolus x1 followed by gtt at 1500 units/hr and monitor heparin levels and CBC.  Wynona Neat, PharmD, BCPS  11/20/2020,2:46 AM

## 2020-11-20 NOTE — ED Notes (Signed)
When this RN asked pt what it is meant by "a loop", pt explained it is an implanted monitor that communicates his rhythm "somewhere in the world"; pt could not tell me the make of "the loop", no observable device or incision

## 2020-11-20 NOTE — Plan of Care (Signed)

## 2020-11-20 NOTE — H&P (Signed)
History and Physical    Phillip Oconnor:[id number] DOB: 21-Jan-1941 DOA: 11/19/2020  PCP: Jenel Lucks, PA-C  Patient coming from: Home  I have personally briefly reviewed patient's old medical records in [place]  Chief Complaint: Loop Recorder issue  HPI: Phillip Oconnor is a 80 y.o. male with medical history significant of DM2, HTN, HLD.  Pt was admitted for seizure in [place] a couple of months ago.  During that admit he was started on Keppra, and a Loop recorder was placed "to look for irregular rhythm".  Patient doesn't say it, and this has also managed to confuse his PCP when he established care on 3/15, but in hind sight I strongly suspect that the reason they put in the loop recorder is because CNS imaging during that admit done due to him presenting with seizure probably showed the old strokes that we are seeing again on CT scan in ED today.  I dont have the records from that admission to prove this though.  Regardless, patient was at baseline health, and had been feeling pretty good today when he got a call that his loop recorder was showing A.Fib and that he needed to come in to the hospital.  Therefore he drove himself to [facility].  Pt reports he has BLE edema that has been ongoing for some years.  He gets SOB with activity at baseline but this isnt unusual for him.  No fevers, chills, N/V.  No history of GIB, blood in stool, etc.   ED Course: BNP 800, small B pleural effusions on CXR.  Pt with rate controlled A.Flutter on EKG and the monitor.  Given 1 dose of 40mg  IV lasix, cards consulted and hospitalist asked to admit.   Review of Systems: As per HPI, otherwise all review of systems negative.  Past Medical History:  Diagnosis Date  . DM2 (diabetes mellitus, type 2) ([place])   . HLD (hyperlipidemia)   . HTN (hypertension)   . Irregular heart beat   . Seizures ([place])   . Stroke [facility])     Past Surgical History:  Procedure Laterality Date  .  CORONARY ANGIOPLASTY WITH STENT PLACEMENT    . LOOP RECORDER IMPLANT       reports that he has never smoked. He has never used smokeless tobacco. He reports that he does not drink alcohol and does not use drugs.  Allergies  Allergen Reactions  . Penicillins Rash and Swelling    Lips swell  . Plavix [Clopidogrel]     unknown    Family History  Problem Relation Age of Onset  . Bleeding Disorder Neg Hx      Prior to Admission medications   Medication Sig Start Date End Date Taking? Authorizing Provider  atorvastatin (LIPITOR) 40 MG tablet Take 1 tablet by mouth daily. 10/29/20  Yes [provider]  doxazosin (CARDURA) 4 MG tablet Take by mouth. 10/29/20  Yes [provider]  levETIRAcetam (KEPPRA) 500 MG tablet Take by mouth. 10/29/20  Yes [provider]  linagliptin (TRADJENTA) 5 MG TABS tablet Take 1 tablet by mouth daily. 11/01/20  Yes [provider]  losartan (COZAAR) 100 MG tablet Take 1 tablet by mouth daily. 10/29/20  Yes [provider]  metFORMIN (GLUCOPHAGE) 500 MG tablet Take by mouth. 10/29/20  Yes [provider]  NIFEdipine (PROCARDIA XL/NIFEDICAL-XL) 90 MG 24 hr tablet Take by mouth. 10/29/20  Yes [provider]  hydrALAZINE (APRESOLINE) 100 MG tablet Take by mouth. 10/29/20  [provider]    Physical Exam: Vitals:   11/19/20 2330 11/20/20 0051 11/20/20 0143 11/20/20 0251  BP: (!) 198/90 (!) 166/87  (!) 207/85  Pulse: 80 65 67 65  Resp: (!) 25 16 15 20   Temp:   97.9 F (36.6 C)   TempSrc:   Oral   SpO2: 98% 97% 99% 98%  Weight:      Height:        Constitutional: NAD, calm, comfortable Eyes: PERRL, lids and conjunctivae normal ENMT: Mucous membranes are moist. Posterior pharynx clear of any exudate or lesions.Normal dentition.  Neck: normal, supple, no masses, no thyromegaly Respiratory: clear to auscultation bilaterally, no wheezing, no crackles. Normal respiratory effort. No accessory  muscle use.  Cardiovascular: IRR, IRR, 1+ BLE edema. Abdomen: no tenderness, no masses palpated. No hepatosplenomegaly. Bowel sounds positive.  Musculoskeletal: no clubbing / cyanosis. No joint deformity upper and lower extremities. Good ROM, no contractures. Normal muscle tone.  Skin: no rashes, lesions, ulcers. No induration Neurologic: CN 2-12 grossly intact. Sensation intact, DTR normal. Strength 5/5 in all 4.  Psychiatric: Normal judgment and insight. Alert and oriented x 3. Normal mood.    Labs on Admission: I have personally reviewed following labs and imaging studies  CBC: Recent Labs  Lab 11/19/20 1652  WBC 19.3*  HGB 11.6*  HCT 33.7*  MCV 92.8  PLT 229   Basic Metabolic Panel: Recent Labs  Lab 11/19/20 1652  NA 134*  K 3.6  CL 100  CO2 22  GLUCOSE 280*  BUN 19  CREATININE 1.53*  CALCIUM 9.5   GFR: Estimated Creatinine Clearance: 46.5 mL/min (A) (by C-G formula based on SCr of 1.53 mg/dL (H)). Liver Function Tests: Recent Labs  Lab 11/19/20 1650  AST 30  ALT 29  ALKPHOS 132*  BILITOT 1.0  PROT 7.3  ALBUMIN 3.4*   Recent Labs  Lab 11/19/20 1650  LIPASE 36   No results for input(s): AMMONIA in the last 168 hours. Coagulation Profile: Recent Labs  Lab 11/19/20 1652  INR 1.1   Cardiac Enzymes: No results for input(s): CKTOTAL, CKMB, CKMBINDEX, TROPONINI in the last 168 hours. BNP (last 3 results) No results for input(s): PROBNP in the last 8760 hours. HbA1C: No results for input(s): HGBA1C in the last 72 hours. CBG: No results for input(s): GLUCAP in the last 168 hours. Lipid Profile: No results for input(s): CHOL, HDL, LDLCALC, TRIG, CHOLHDL, LDLDIRECT in the last 72 hours. Thyroid Function Tests: Recent Labs    11/19/20 1652  TSH 2.299   Anemia Panel: No results for input(s): VITAMINB12, FOLATE, FERRITIN, TIBC, IRON, RETICCTPCT in the last 72 hours. Urine analysis:    Component Value Date/Time   COLORURINE YELLOW 11/19/2020 1816    APPEARANCEUR CLEAR 11/19/2020 1816   LABSPEC 1.020 11/19/2020 1816   PHURINE 5.5 11/19/2020 1816   GLUCOSEU >=500 (A) 11/19/2020 1816   HGBUR NEGATIVE 11/19/2020 1816   BILIRUBINUR NEGATIVE 11/19/2020 1816   KETONESUR NEGATIVE 11/19/2020 1816   PROTEINUR NEGATIVE 11/19/2020 1816   NITRITE NEGATIVE 11/19/2020 1816   LEUKOCYTESUR NEGATIVE 11/19/2020 1816    Radiological Exams on Admission: DG Chest 2 View  Result Date: 11/19/2020 CLINICAL DATA:  Atrial fibrillation. EXAM: CHEST - 2 VIEW COMPARISON:  None. FINDINGS: The heart size and mediastinal contours are within normal limits. No pneumothorax is noted. Small bilateral pleural effusions are noted. No consolidative process is noted. The visualized skeletal structures are unremarkable. IMPRESSION: Small bilateral pleural effusions. Electronically Signed   By:  Marijo Conception M.D.   On: 11/19/2020 18:13   CT Head Wo Contrast  Result Date: 11/19/2020 CLINICAL DATA:  Stroke follow-up EXAM: CT HEAD WITHOUT CONTRAST TECHNIQUE: Contiguous axial images were obtained from the base of the skull through the vertex without intravenous contrast. COMPARISON:  None. FINDINGS: Brain: Mild atrophy. Negative for hydrocephalus. Chronic infarct right frontal lobe. Hypodensity in the frontal white matter bilaterally right greater than left. Negative for acute cortical infarct, hemorrhage, mass. Vascular: Negative for hyperdense vessel Skull: Negative Sinuses/Orbits: Mucoperiosteal thickening right maxillary sinus. Negative orbit Other: None IMPRESSION: No acute abnormality.  Chronic infarct right frontal lobe. Electronically Signed   By: Franchot Gallo M.D.   On: 11/19/2020 20:25    EKG: Independently reviewed. A.Flutter with rate of 67.  Assessment/Plan Principal Problem:   Atrial flutter, paroxysmal (HCC) Active Problems:   DM2 (diabetes mellitus, type 2) (HCC)   HTN (hypertension)   History of multiple strokes   Seizure (HCC)    1. p A.Flutter - New  onset 1. Rate controlled thanks to the patient already being on nifedipine for HTN control 2. A.Fib pathway 3. Tele monitor 4. 2d echo 5. CHADS-VASC is at least 7 (assuming we dont say he has new onset CHF today, 8 otherwise). 6. Stop ASA 7. Start Heparin gtt 8. If no bleeding evidence during stay, plan to convert to eliquis 2. H/o Seizure - 1. Cont keppra 3. HLD - cont statin 4. HTN - 1. Cont nifedipine, losartan, cardura 2. Add PRN hydralazine IV (actually looks like hes supposed to be PO hydralazine TID at home but doesn't sound like he is) 5. DM2 - 1. Hold home PO meds 2. Mod scale SSI AC 6. Prior strokes - 1. Seen on CT today 2. Presumably this is WHY they put in a loop recorder in [place] during the seizure admission. 3. Stop ASA and start A.Flutter anticoagulation as above 7. Undiagnosed / chronic CHF - 1. 2d echo already ordered 2. Sounds like pt is compensated to his baseline 3. Got 1 dose of lasix in ED 4. Ill hold off on further diuresis for the moment pending cards consult in AM.  DVT prophylaxis: Heparin gtt Code Status: Full Family Communication: No family in room Disposition Plan: Home after cleared by cards Consults called: EDP spoke with Dr. Virgina Jock Admission status: Place in obs   Reed Dady M. DO [facility]  How to contact the [facility] Attending or Consulting provider [place] or covering provider during after hours [place], for this patient?  1. Check the care team in [facility] and look for a) attending/consulting [facility] provider listed and b) the [facility] team listed 2. Log into [email or web address]  [employer] Physician Scheduling and messaging for groups and whole hospitals  On call and physician scheduling software for group practices, residents, hospitalists and other medical providers for call, clinic, rotation and shift schedules. On[employer] is a hospital-wide system for scheduling doctors and paging doctors on call. EasyPlot is for scientific plotting and data  analysis.  [email or web address]  and use [place]'s universal password to access. If you do not have the password, please contact the hospital operator.  3. Locate the [facility] provider you are looking for under [facility] and page to a number that you can be directly reached. 4. If you still have difficulty reaching the provider, please page the [facility] (Director on Call) for the Hospitalists listed on amion for assistance.  11/20/2020, 3:09 AM

## 2020-11-20 NOTE — Progress Notes (Signed)
ANTICOAGULATION CONSULT NOTE - Follow Up Consult  Pharmacy Consult for Heparin Indication: atrial fibrillation  Allergies  Allergen Reactions  . Penicillins Rash and Swelling    Lips swell  . Plavix [Clopidogrel] Palpitations    Patient Measurements: Height: 6\' 4"  (193 cm) Weight: 93.3 kg (205 lb 11 oz) IBW/kg (Calculated) : 86.8 Heparin Dosing Weight:    Vital Signs: Temp: 97.5 F (36.4 C) (04/06 1612) Temp Source: Oral (04/06 1612) BP: 132/63 (04/06 1612) Pulse Rate: 71 (04/06 1612)  Labs: Recent Labs    11/19/20 1652 11/19/20 1930 11/20/20 0817 11/20/20 1039 11/20/20 1740 11/20/20 2047  HGB 11.6*  --  9.6*  --  9.8*  --   HCT 33.7*  --  28.4*  --  28.4*  --   PLT 386  --  349  --   --   --   LABPROT 14.0  --   --   --   --   --   INR 1.1  --   --   --   --   --   HEPARINUNFRC  --   --   --  0.27*  --  0.53  CREATININE 1.53*  --  1.42*  --   --   --   TROPONINIHS 46* 48*  --   --   --   --     Estimated Creatinine Clearance: 50.1 mL/min (A) (by C-G formula based on SCr of 1.42 mg/dL (H)).  Assessment: Anticoag: heparin for afib (hx CVA). No AC PTA. Hep level 0.53 in goal.  Goal of Therapy:  Heparin level 0.3-0.7 units/ml Monitor platelets by anticoagulation protocol: Yes   Plan:  Con't IV heparin at 1600 units/hr Daily HL and CBC   Sejla Marzano S. Alford Highland, PharmD, BCPS Clinical Staff Pharmacist [email or web address] Alford Highland, [place] 11/20/2020,9:17 PM

## 2020-11-20 NOTE — Consult Note (Signed)
CARDIOLOGY CONSULT NOTE  Patient ID: Phillip Oconnor MRN: [id number] DOB/AGE: January 20, 1941 80 y.o.  Admit date: 11/19/2020 Attending physician: Kayleen Memos, DO Primary Physician:  Jenel Lucks, PA-C Outpatient Cardiologist: NA  Inpatient Cardiologist: Rex Kras, DO, [facility]  Referring provider: Dr. Johnney Killian  Reason of consult: Atrial Fib and Questionable HF Chief complaint: Fatigue and irregular heartbeat.  HPI:  Phillip Oconnor is a 80 y.o. Caucasian male who presents with a chief complaint of "fatigue and irregular heartbeat." His past medical history and cardiovascular risk factors include: Hypertension, hyperlipidemia, diabetes mellitus type 2, history of stroke (per imaging CT head without contrast 11/19/2020), atrial flutter.   Patient is originally from [place] but recently visited family in [place] during that time he was hospitalized at [facility] for 15 days.  Patient states that he underwent multiple testing and was noted to have a seizure, and irregular heartbeat.  He also had a loop recorder implanted prior to discharge.  He came back to [facility] and was installing the remote transmitter at bedside yesterday and 2 hours later got a call from the [facility] that he has an irregular heartbeat and to go to the hospital for further evaluation and management.  Patient states that he had appointment to establish care with a local cardiologist when he reached at their office he was advised to go to the hospital as well.  Cardiology is asked to see him for management of atrial flutter and possible congestive heart failure.  Patient denies any chest pain at rest or with effort late activities.  His shortness of breath remains stable mostly pronounced with effort related activities.  He denies orthopnea, paroxysmal nocturnal dyspnea.  He has chronic lower extremity swelling which he notes is secondary to venous insufficiency.  The swelling usually gets worse  throughout the day and improves in the morning.  Known history of coronary artery disease with prior PCI in 2011.  Patient does not recall any additional details.  No prior history of intracranial bleeding, gastrointestinal bleeding, or use of packed red blood cells.  Patient denies any recent falls or recent surgeries.  ALLERGIES: Allergies  Allergen Reactions  . Penicillins Rash and Swelling    Lips swell  . Plavix [Clopidogrel] Palpitations    PAST MEDICAL HISTORY: Past Medical History:  Diagnosis Date  . DM2 (diabetes mellitus, type 2) ([place])   . HLD (hyperlipidemia)   . HTN (hypertension)   . Irregular heart beat   . Seizures ([place])   . Stroke ([place])   C1-C6 neck fusion.  Hernia repair   PAST SURGICAL HISTORY: Past Surgical History:  Procedure Laterality Date  . CORONARY ANGIOPLASTY WITH STENT PLACEMENT    . LOOP RECORDER IMPLANT      FAMILY HISTORY: The patient family history is not on file. No family history of premature CAD.   SOCIAL HISTORY:  Former smoker.  No ETOH or illicit drug use.   MEDICATIONS: Current Outpatient Medications  Medication Instructions  . atorvastatin (LIPITOR) 40 MG tablet 1 tablet, Oral, Daily  . doxazosin (CARDURA) 4 mg, Oral, Daily  . hydrALAZINE (APRESOLINE) 100 mg, Oral, 3 times daily  . levETIRAcetam (KEPPRA) 500 mg, Oral, Daily  . linagliptin (TRADJENTA) 5 MG TABS tablet 1 tablet, Oral, Daily  . losartan (COZAAR) 100 MG tablet 1 tablet, Oral, Daily  . metFORMIN (GLUCOPHAGE) 500 mg, Oral, 2 times daily with meals  . NIFEdipine (PROCARDIA XL/NIFEDICAL-XL) 90 mg, Oral, Daily    14 ORGAN REVIEW OF  SYSTEMS: Review of Systems  Constitutional: Positive for malaise/fatigue. Negative for chills and fever.  HENT: Negative for hoarse voice and nosebleeds.   Eyes: Negative for discharge, double vision and pain.  Cardiovascular: Positive for leg swelling. Negative for chest pain, claudication, dyspnea on exertion, near-syncope,  orthopnea, palpitations, paroxysmal nocturnal dyspnea and syncope.  Respiratory: Negative for hemoptysis and shortness of breath.   Musculoskeletal: Negative for muscle cramps and myalgias.  Gastrointestinal: Negative for abdominal pain, constipation, diarrhea, hematemesis, hematochezia, melena, nausea and vomiting.  Neurological: Negative for dizziness and light-headedness.  All other systems reviewed and are negative.   PHYSICAL EXAM: Vitals with BMI 11/20/2020 11/20/2020 11/20/2020  Height - - -  Weight - - -  BMI - - -  Systolic 606 301 601  Diastolic 53 70 56  Pulse 69 66 66     Intake/Output Summary (Last 24 hours) at 11/20/2020 1610 Last data filed at 11/20/2020 0622 Gross per 24 hour  Intake 308.33 ml  Output 2425 ml  Net -2116.67 ml    Net IO Since Admission: -2,116.67 mL [11/20/20 1610]  CONSTITUTIONAL: Well-developed and well-nourished. No acute distress.  SKIN: Skin is warm and dry. No rash noted. No cyanosis. No pallor. No jaundice HEAD: Normocephalic and atraumatic.  EYES: No scleral icterus MOUTH/THROAT: Moist oral membranes.  NECK: No JVD present. No thyromegaly noted. No carotid bruits  LYMPHATIC: No visible cervical adenopathy.  CHEST Normal respiratory effort. No intercostal retractions. Loop recorder site is C/D/I LUNGS: Clear to auscultation bilaterally. No stridor. No wheezes. No rales.  CARDIOVASCULAR: Irregularly irregular, variable S1-S2, no murmurs rubs or gallops appreciated. ABDOMINAL: Soft, nontender, nondistended, positive bowel sounds in all 4 quadrants, no apparent ascites.  EXTREMITIES: Trace bilateral peripheral edema compression stockings present. HEMATOLOGIC: No significant bruising NEUROLOGIC: Oriented to person, place, and time. Nonfocal. Normal muscle tone.  PSYCHIATRIC: Normal mood and affect. Normal behavior. Cooperative.  RADIOLOGY: DG Chest 2 View  Result Date: 11/19/2020 CLINICAL DATA:  Atrial fibrillation. EXAM: CHEST - 2 VIEW  COMPARISON:  None. FINDINGS: The heart size and mediastinal contours are within normal limits. No pneumothorax is noted. Small bilateral pleural effusions are noted. No consolidative process is noted. The visualized skeletal structures are unremarkable. IMPRESSION: Small bilateral pleural effusions. Electronically Signed   By: Marijo Conception M.D.   On: 11/19/2020 18:13   CT Head Wo Contrast  Result Date: 11/19/2020 CLINICAL DATA:  Stroke follow-up EXAM: CT HEAD WITHOUT CONTRAST TECHNIQUE: Contiguous axial images were obtained from the base of the skull through the vertex without intravenous contrast. COMPARISON:  None. FINDINGS: Brain: Mild atrophy. Negative for hydrocephalus. Chronic infarct right frontal lobe. Hypodensity in the frontal white matter bilaterally right greater than left. Negative for acute cortical infarct, hemorrhage, mass. Vascular: Negative for hyperdense vessel Skull: Negative Sinuses/Orbits: Mucoperiosteal thickening right maxillary sinus. Negative orbit Other: None IMPRESSION: No acute abnormality.  Chronic infarct right frontal lobe. Electronically Signed   By: Franchot Gallo M.D.   On: 11/19/2020 20:25   ECHOCARDIOGRAM COMPLETE  Result Date: 11/20/2020    ECHOCARDIOGRAM REPORT   Patient Name:   CAN LUCCI Date of Exam: 11/20/2020 Medical Rec #:  [id number]      Height:       76.0 in Accession #:    [id number]     Weight:       205.7 lb Date of Birth:  1941/06/22      BSA:          2.242 m Patient Age:  79 years       BP:           140/56 mmHg Patient Gender: M              HR:           66 bpm. Exam Location:  Inpatient Procedure: 2D Echo, Cardiac Doppler and Color Doppler Indications:    Atrial fibrillation  History:        Patient has no prior history of Echocardiogram examinations.                 Stroke; Risk Factors:Hypertension, Diabetes and hyperlipidemia.  Sonographer:    Luisa Hart RDCS Referring Phys: 76 JARED M GARDNER  Sonographer Comments: Technically difficult  study due to poor echo windows. IMPRESSIONS  1. Left ventricular ejection fraction, by estimation, is 70 to 75%. The left ventricle has hyperdynamic function. The left ventricle has no regional wall motion abnormalities. There is mild left ventricular hypertrophy. Left ventricular diastolic parameters are indeterminate.  2. Right ventricular systolic function is normal. The right ventricular size is normal. There is mildly elevated pulmonary artery systolic pressure. The estimated right ventricular systolic pressure is 29.4 mmHg.  3. The mitral valve is grossly normal. Trivial mitral valve regurgitation.  4. The aortic valve is tricuspid. Aortic valve regurgitation is not visualized. Mild aortic valve sclerosis is present, with no evidence of aortic valve stenosis.  5. Evidence of atrial level shunting detected by color flow Doppler. Agitated saline contrast bubble study was positive with shunting observed within 3-6 cardiac cycles suggestive of interatrial shunt. 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 Comparison(s): No prior Echocardiogram. FINDINGS  Left Ventricle: Left ventricular ejection fraction, by estimation, is 70 to 75%. The left ventricle has hyperdynamic function. The left ventricle has no regional wall motion abnormalities. The left ventricular internal cavity size was normal in size. There is mild left ventricular hypertrophy. Left ventricular diastolic function could not be evaluated due to atrial fibrillation. Left ventricular diastolic parameters are indeterminate. Right Ventricle: The right ventricular size is normal. No increase in right ventricular wall thickness. Right ventricular systolic function is normal. There is mildly elevated pulmonary artery systolic pressure. The tricuspid regurgitant velocity is 2.99  m/s, and with an assumed right atrial pressure of 3 mmHg, the estimated right ventricular systolic pressure is 76.5  mmHg. Left Atrium: Left atrial size was normal in size. Right Atrium: Right atrial size was normal in size. Pericardium: There is no evidence of pericardial effusion. Mitral Valve: The mitral valve is grossly normal. Trivial mitral valve regurgitation. Tricuspid Valve: The tricuspid valve is grossly normal. Tricuspid valve regurgitation is trivial. Aortic Valve: The aortic valve is tricuspid. Aortic valve regurgitation is not visualized. Mild aortic valve sclerosis is present, with no evidence of aortic valve stenosis. Aortic valve mean gradient measures 3.0 mmHg. Aortic valve peak gradient measures 5.8 mmHg. Aortic valve area, by VTI measures 2.49 cm. Pulmonic Valve: The pulmonic valve was grossly normal. Pulmonic valve regurgitation is trivial. Aorta: The aortic root and ascending aorta are structurally normal, with no evidence of dilitation. IAS/Shunts: The interatrial septum is aneurysmal. Evidence of atrial level shunting detected by color flow Doppler. Agitated saline contrast bubble study was positive with shunting observed within 3-6 cardiac cycles suggestive of interatrial shunt. A small patent foramen ovale is detected with predominantly left to right shunting across the atrial septum.  LEFT VENTRICLE PLAX 2D LVIDd:  4.90 cm     Diastology LVIDs:         3.00 cm     LV e' medial:    9.37 cm/s LV PW:         1.10 cm     LV E/e' medial:  9.7 LV IVS:        1.40 cm     LV e' lateral:   12.60 cm/s LVOT diam:     2.10 cm     LV E/e' lateral: 7.2 LV SV:         63 LV SV Index:   28 LVOT Area:     3.46 cm  LV Volumes (MOD) LV vol d, MOD A2C: 68.6 ml LV vol d, MOD A4C: 56.8 ml LV vol s, MOD A2C: 19.1 ml LV vol s, MOD A4C: 13.3 ml LV SV MOD A2C:     49.5 ml LV SV MOD A4C:     56.8 ml LV SV MOD BP:      47.0 ml RIGHT VENTRICLE RV S prime:     18.50 cm/s TAPSE (M-mode): 2.4 cm LEFT ATRIUM           Index LA diam:      3.60 cm 1.61 cm/m LA Vol (A4C): 37.1 ml 16.55 ml/m  AORTIC VALVE                    PULMONIC VALVE AV Area (Vmax):    2.56 cm    PV Vmax:       1.56 m/s AV Area (Vmean):   2.35 cm    PV Vmean:      101.000 cm/s AV Area (VTI):     2.49 cm    PV VTI:        0.271 m AV Vmax:           120.00 cm/s PV Peak grad:  9.7 mmHg AV Vmean:          85.900 cm/s PV Mean grad:  5.0 mmHg AV VTI:            0.253 m AV Peak Grad:      5.8 mmHg AV Mean Grad:      3.0 mmHg LVOT Vmax:         88.60 cm/s LVOT Vmean:        58.200 cm/s LVOT VTI:          0.182 m LVOT/AV VTI ratio: 0.72  AORTA Ao Root diam: 3.60 cm Ao Asc diam:  3.90 cm MITRAL VALVE               TRICUSPID VALVE MV Area (PHT): 4.21 cm    TR Peak grad:   35.8 mmHg MV Decel Time: 180 msec    TR Vmax:        299.00 cm/s MV E velocity: 91.30 cm/s MV A velocity: 46.00 cm/s  SHUNTS MV E/A ratio:  1.98        Systemic VTI:  0.18 m                            Systemic Diam: 2.10 cm Lyman Bishop MD Electronically signed by Lyman Bishop MD Signature Date/Time: 11/20/2020/3:36:07 PM    Final     LABORATORY DATA: Lab Results  Component Value Date   WBC 17.2 (H) 11/20/2020   HGB 9.6 (L) 11/20/2020   HCT 28.4 (L) 11/20/2020   MCV 92.5  11/20/2020   PLT 349 11/20/2020    Recent Labs  Lab 11/20/20 0817  NA 135  K 3.4*  CL 100  CO2 25  BUN 13  CREATININE 1.42*  CALCIUM 8.9  PROT 5.6*  BILITOT 1.0  ALKPHOS 100  ALT 21  AST 20  GLUCOSE 250*    Lipid Panel  No results found for: CHOL, TRIG, HDL, CHOLHDL, VLDL, LDLCALC  BNP (last 3 results) Recent Labs    11/19/20 1650  BNP 897.2*    HEMOGLOBIN A1C Lab Results  Component Value Date   HGBA1C 9.3 (H) 11/20/2020   MPG 220.21 11/20/2020    Cardiac Panel (last 3 results) No results for input(s): CKTOTAL, CKMB, RELINDX in the last 8760 hours.  Invalid input(s): TROPONINHS  No results found for: CKTOTAL, CKMB, CKMBINDEX   TSH Recent Labs    11/19/20 1652  TSH 2.299      CARDIAC DATABASE: EKG: 11/19/2020: Atypical atrial flutter with variable conduction, 65 bpm,  underlying right bundle branch block, 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  Echocardiogram: 11/20/2020: 1. Left ventricular ejection fraction, by estimation, is 70 to 75%. The  left ventricle has hyperdynamic function. The left ventricle has no  regional wall motion abnormalities. There is mild left ventricular  hypertrophy. Left ventricular diastolic  parameters are indeterminate.  2. Right ventricular systolic function is normal. The right ventricular  size is normal. There is mildly elevated pulmonary artery systolic  pressure. The estimated right ventricular systolic pressure is 46.6 mmHg.  3. The mitral valve is grossly normal. Trivial mitral valve  regurgitation.  4. The aortic valve is tricuspid. Aortic valve regurgitation is not  visualized. Mild aortic valve sclerosis is present, with no evidence of  aortic valve stenosis.  5. Evidence of atrial level shunting detected by color flow Doppler.  Agitated saline contrast bubble study was positive with shunting observed  within 3-6 cardiac cycles suggestive of interatrial shunt. There is a  small patent foramen ovale with  predominantly left to right shunting across the atrial septum.   IMPRESSION & RECOMMENDATIONS: EBB CARELOCK is a 80 y.o. African-American male whose past medical history and cardiovascular risk factors include: Hypertension, hyperlipidemia, diabetes mellitus type 2, history of stroke (per imaging CT head without contrast 11/19/2020), atrial flutter.  Impression & Plan: Atypical atrial flutter: Rate controlled. Transition him from Procardia to Toprol-XL 50 mg p.o. every morning starting tomorrow Rhythm control: N/A Thromboembolic prophylaxis: Currently on IV heparin drip.  TSH within normal limits EKG in morning.   Long-term oral anticoagulation: Indication: Atrial flutter. Discussed the risks, benefits and alternatives to oral anticoagulation.   Patient is agreeable to  transitioning to oral anticoagulation given his CHA2DS2-VASc score.  Trend hemoglobin for additional 24 hours prior to initiating oral anticoagulation. CHA2DS2-VASc SCORE is 6 which correlates to 9.7 % risk of stroke per year (Age, HTN, DM, hx of stroke). Patient has been educated on the importance of monitoring for evidence of bleeding which includes but not limited to hemoptysis, hematochezia, melanotic stools, and hematuria. Patient educated on fall precautions and if he was to be injured despite the mechanism of injury he is to seek medical attention at the closest ER since he is on blood thinners.  Patient understands importance of this because if internal bleeding is not treated in a timely manner it may further lead to morbidity and/or mortality.  Patient voices understanding of these recommendations and provides verbal feedback. Patient states that his next kin is his  brother [name] and he is aware of the patient's wishes.  LE swelling: Clinically patient does not appear to be in congestive heart failure. No JVP on examination or vascular congestion on chest x-ray. Echocardiogram notes preserved LVEF without any significant valvular heart disease. Elevated BNP may be secondary to his underlying atrial flutter. Lower extremity swelling may be secondary to chronic venous insufficiency.  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 Transition from Procardia to Toprol XL  Echo results reviewed.   Small PFO:  Noted on a bubble study. Monitor for now.   Benign essential hypertension: Per primary team.  Hyperlipidemia: continue statin therapy. Fasting lipids in am.   Non-insulin-dependent diabetes mellitus type 2: per primary team.   History of stroke (per imaging, but patient denies such history): d/c ASA 325mg  po qday. Will hold ASA 81mg  po qday due to initiation of AC and down treading Hb. Continue statin. Secondary  prevention recommended.   Former smoker.   Spoke to his nurse to [facility] w/ regards to discharge summary, old echo & ecg, cardiology consult.   Total encounter time 87 minutes. *Total Encounter Time as defined by the Centers for Medicare and Medicaid Services includes, in addition to the face-to-face time of a patient visit (documented in the note above) non-face-to-face time: obtaining and reviewing outside history, ordering and reviewing medications, tests or procedures, care coordination (communications with other health care professionals or caregivers) and documentation in the medical record.  Patient's questions and concerns were addressed to his satisfaction. He voices understanding of the instructions provided during this encounter.   This note was created using a voice recognition software as a result there may be grammatical errors inadvertently enclosed that do not reflect the nature of this encounter. Every attempt is made to correct such errors.  Mechele Claude [facility]  Pager: [phone] Office: [phone] 11/20/2020, 4:10 PM

## 2020-11-20 NOTE — ED Notes (Signed)
Spoke to pt brother Bruce per pt request, updated him on pt transfer and gave him pt room number and number for the unit

## 2020-11-20 NOTE — Progress Notes (Signed)
Requested to obtain medical records from patients hospitalization in January at [place] [facility] [place] [facility] [phone]) and spoke to the nurse Tonni. Given contact information at [facility] for medical records [place]. Left a message on both phones to call [phone] and speak to the charge nurse or nurse caring for Phillip Oconnor and we were requesting medical records. No other information given. Numbers are [phone] and [phone]. Records Dr. Terri Skains requested are Discharge Summary, Cardiology Consult and Echocardiogram. Patient had a loop recorder inserted during hospitalization and needed additional information.

## 2020-11-20 NOTE — Progress Notes (Signed)
[place] for heparin Indication: atrial fibrillation  Allergies  Allergen Reactions  . Penicillins Rash and Swelling    Lips swell  . Plavix [Clopidogrel] Palpitations    Patient Measurements: Height: 6\' 4"  (193 cm) Weight: 93.3 kg (205 lb 11 oz) IBW/kg (Calculated) : 86.8  Vital Signs: Temp: 97.5 F (36.4 C) (04/06 1204) Temp Source: Oral (04/06 1204) BP: 122/53 (04/06 1204) Pulse Rate: 69 (04/06 1204)  Labs: Recent Labs    11/19/20 1652 11/19/20 1930 11/20/20 0817 11/20/20 1039  HGB 11.6*  --  9.6*  --   HCT 33.7*  --  28.4*  --   PLT 386  --  349  --   LABPROT 14.0  --   --   --   INR 1.1  --   --   --   HEPARINUNFRC  --   --   --  0.27*  CREATININE 1.53*  --  1.42*  --   TROPONINIHS 46* 48*  --   --     Estimated Creatinine Clearance: 50.1 mL/min (A) (by C-G formula based on SCr of 1.42 mg/dL (H)).   Medical History: Past Medical History:  Diagnosis Date  . DM2 (diabetes mellitus, type 2) ([place])   . HLD (hyperlipidemia)   . HTN (hypertension)   . Irregular heart beat   . Seizures ([place])   . Stroke [facility])     Medications:  Medications Prior to Admission  Medication Sig Dispense Refill Last Dose  . atorvastatin (LIPITOR) 40 MG tablet Take 1 tablet by mouth daily.   11/19/2020 at Unknown time  . doxazosin (CARDURA) 4 MG tablet Take 4 mg by mouth daily.   11/19/2020 at Unknown time  . hydrALAZINE (APRESOLINE) 100 MG tablet Take 100 mg by mouth 3 (three) times daily.   11/19/2020 at Unknown time  . levETIRAcetam (KEPPRA) 500 MG tablet Take 500 mg by mouth daily.   11/18/2020 at Unknown time  . linagliptin (TRADJENTA) 5 MG TABS tablet Take 1 tablet by mouth daily.   11/18/2020  . metFORMIN (GLUCOPHAGE) 500 MG tablet Take 500 mg by mouth 2 (two) times daily with a meal.   11/19/2020 at Unknown time  . NIFEdipine (PROCARDIA XL/NIFEDICAL-XL) 90 MG 24 hr tablet Take 90 mg by mouth daily.   11/18/2020 at Unknown time  . losartan (COZAAR)  100 MG tablet Take 1 tablet by mouth daily.      Scheduled:  . atorvastatin  80 mg Oral Daily  . doxazosin  4 mg Oral Daily  . insulin aspart  0-15 Units Subcutaneous TID WC  . levETIRAcetam  500 mg Oral BID  . losartan  100 mg Oral Daily  . NIFEdipine  90 mg Oral Daily  . nitroGLYCERIN  1 inch Topical Q6H    Assessment: 80yo male had loop recorder implanted during hospitalization for sz and had it activated recently, was told by recorder company to seek care for irregular heart rhythm, found to be in Afib. Pharmacy dosing heparin. Plans noted for apixaban -heparin level= 0.27   Goal of Therapy:  Heparin level 0.3-0.7 units/ml Monitor platelets by anticoagulation protocol: Yes   Plan:  -Increase heparin to 1600 units/hr -Heparin level in 8 hours and daily wth CBC daily  Hildred Laser, PharmD Clinical Pharmacist **Pharmacist phone directory can now be found on [email or web address] ([email or web address]).  Listed under [place].

## 2020-11-20 NOTE — TOC Benefit Eligibility Note (Signed)
Patient Teacher, English as a foreign language completed.    The patient is currently admitted and upon discharge could be taking Eliquis 5 mg.  The current 30 day co-pay is, $47.00.   The patient is currently admitted and upon discharge could be taking Xarelto 15 mg.  The current 30 day co-pay is, $47.00.   The patient is insured through [place], [place] Patient Advocate Specialist Linda Team Direct Number: [phone]  Fax: [phone]

## 2020-11-21 LAB — CBC
HCT: 26.4 % — ABNORMAL LOW (ref 39.0–52.0)
HCT: 27.5 % — ABNORMAL LOW (ref 39.0–52.0)
Hemoglobin: 9 g/dL — ABNORMAL LOW (ref 13.0–17.0)
Hemoglobin: 9.3 g/dL — ABNORMAL LOW (ref 13.0–17.0)
MCH: 31.6 pg (ref 26.0–34.0)
MCH: 31.4 pg (ref 26.0–34.0)
MCHC: 34.1 g/dL (ref 30.0–36.0)
MCHC: 33.8 g/dL (ref 30.0–36.0)
MCV: 92.6 fL (ref 80.0–100.0)
MCV: 92.9 fL (ref 80.0–100.0)
Platelets: 365 10*3/uL (ref 150–400)
Platelets: 388 10*3/uL (ref 150–400)
RBC: 2.85 MIL/uL — ABNORMAL LOW (ref 4.22–5.81)
RBC: 2.96 MIL/uL — ABNORMAL LOW (ref 4.22–5.81)
RDW: 13.1 % (ref 11.5–15.5)
RDW: 13.2 % (ref 11.5–15.5)
WBC: 11.2 10*3/uL — ABNORMAL HIGH (ref 4.0–10.5)
WBC: 8.6 10*3/uL (ref 4.0–10.5)
nRBC: 0 % (ref 0.0–0.2)
nRBC: 0 % (ref 0.0–0.2)

## 2020-11-21 LAB — GLUCOSE, CAPILLARY
Glucose-Capillary: 195 mg/dL — ABNORMAL HIGH (ref 70–99)
Glucose-Capillary: 180 mg/dL — ABNORMAL HIGH (ref 70–99)
Glucose-Capillary: 232 mg/dL — ABNORMAL HIGH (ref 70–99)
Glucose-Capillary: 303 mg/dL — ABNORMAL HIGH (ref 70–99)

## 2020-11-21 LAB — IRON AND TIBC
Iron: 46 ug/dL (ref 45–182)
Saturation Ratios: 19 % (ref 17.9–39.5)
TIBC: 244 ug/dL — ABNORMAL LOW (ref 250–450)
UIBC: 198 ug/dL

## 2020-11-21 LAB — HEMOGLOBIN A1C
Hgb A1c MFr Bld: 9.6 % — ABNORMAL HIGH (ref 4.8–5.6)
Mean Plasma Glucose: 228.82 mg/dL

## 2020-11-21 LAB — BASIC METABOLIC PANEL
Anion gap: 9 (ref 5–15)
Anion gap: 6 (ref 5–15)
BUN: 16 mg/dL (ref 8–23)
BUN: 16 mg/dL (ref 8–23)
CO2: 24 mmol/L (ref 22–32)
CO2: 25 mmol/L (ref 22–32)
Calcium: 8.4 mg/dL — ABNORMAL LOW (ref 8.9–10.3)
Calcium: 8.3 mg/dL — ABNORMAL LOW (ref 8.9–10.3)
Chloride: 100 mmol/L (ref 98–111)
Chloride: 103 mmol/L (ref 98–111)
Creatinine, Ser: 1.6 mg/dL — ABNORMAL HIGH (ref 0.61–1.24)
Creatinine, Ser: 1.47 mg/dL — ABNORMAL HIGH (ref 0.61–1.24)
GFR, Estimated: 43 mL/min — ABNORMAL LOW (ref >60)
GFR, Estimated: 48 mL/min — ABNORMAL LOW (ref >60)
Glucose, Bld: 222 mg/dL — ABNORMAL HIGH (ref 70–99)
Glucose, Bld: 216 mg/dL — ABNORMAL HIGH (ref 70–99)
Potassium: 3.2 mmol/L — ABNORMAL LOW (ref 3.5–5.1)
Potassium: 3.9 mmol/L (ref 3.5–5.1)
Sodium: 133 mmol/L — ABNORMAL LOW (ref 135–145)
Sodium: 134 mmol/L — ABNORMAL LOW (ref 135–145)

## 2020-11-21 LAB — HEPARIN LEVEL (UNFRACTIONATED)
Heparin Unfractionated: <0.1 IU/mL — ABNORMAL LOW (ref 0.30–0.70)
Heparin Unfractionated: 0.59 IU/mL (ref 0.30–0.70)

## 2020-11-21 LAB — RETICULOCYTES
Immature Retic Fract: 24.3 % — ABNORMAL HIGH (ref 2.3–15.9)
RBC.: 2.97 MIL/uL — ABNORMAL LOW (ref 4.22–5.81)
Retic Count, Absolute: 57.9 10*3/uL (ref 19.0–186.0)
Retic Ct Pct: 2 % (ref 0.4–3.1)

## 2020-11-21 LAB — URINE CULTURE: Culture: <10000 — AB

## 2020-11-21 LAB — MAGNESIUM: Magnesium: 1.8 mg/dL (ref 1.7–2.4)

## 2020-11-21 LAB — FOLATE: Folate: 6.6 ng/mL (ref >5.9)

## 2020-11-21 LAB — FERRITIN: Ferritin: 181 ng/mL (ref 24–336)

## 2020-11-21 LAB — SAVE SMEAR(SSMR), FOR PROVIDER SLIDE REVIEW

## 2020-11-21 LAB — VITAMIN B12: Vitamin B-12: 246 pg/mL (ref 180–914)

## 2020-11-21 MED ORDER — FOLIC ACID 1 MG PO TABS
1.0000 mg | ORAL_TABLET | Freq: Every day | ORAL | Status: DC
  Administered 2020-11-21 – 2020-11-25 (×5): 1 mg via ORAL
  Filled 2020-11-21 (×5): qty 1

## 2020-11-21 MED ORDER — POTASSIUM CHLORIDE CRYS ER 20 MEQ PO TBCR
40.0000 meq | EXTENDED_RELEASE_TABLET | Freq: Once | ORAL | Status: AC
  Administered 2020-11-21: 40 meq via ORAL
  Filled 2020-11-21: qty 2

## 2020-11-21 MED ORDER — INSULIN GLARGINE 100 UNIT/ML ~~LOC~~ SOLN
5.0000 [IU] | Freq: Two times a day (BID) | SUBCUTANEOUS | Status: DC
  Administered 2020-11-21 – 2020-11-22 (×3): 5 [IU] via SUBCUTANEOUS
  Filled 2020-11-21 (×5): qty 0.05

## 2020-11-21 MED ORDER — METOPROLOL SUCCINATE ER 50 MG PO TB24
50.0000 mg | ORAL_TABLET | Freq: Every day | ORAL | Status: DC
  Administered 2020-11-21 – 2020-11-22 (×2): 50 mg via ORAL
  Filled 2020-11-21 (×2): qty 1

## 2020-11-21 MED ORDER — HEPARIN BOLUS VIA INFUSION
1000.0000 [IU] | Freq: Once | INTRAVENOUS | Status: AC
  Administered 2020-11-21: 1000 [IU] via INTRAVENOUS
  Filled 2020-11-21: qty 1000

## 2020-11-21 MED ORDER — SENNOSIDES-DOCUSATE SODIUM 8.6-50 MG PO TABS
2.0000 | ORAL_TABLET | Freq: Every day | ORAL | Status: DC
  Administered 2020-11-21 – 2020-11-23 (×3): 2 via ORAL
  Filled 2020-11-21 (×5): qty 2

## 2020-11-21 NOTE — Progress Notes (Addendum)
Inpatient Diabetes Program Recommendations  AACE/ADA: New Consensus Statement on Inpatient Glycemic Control (2015)  Target Ranges:  Prepandial:   less than 140 mg/dL      Peak postprandial:   less than 180 mg/dL (1-2 hours)      Critically ill patients:  140 - 180 mg/dL   Lab Results  Component Value Date   GLUCAP 180 (H) 11/21/2020   HGBA1C 9.6 (H) 11/21/2020    Review of Glycemic Control  Diabetes history: DM 2 Outpatient Diabetes medications: metformin 500 mg bid, Tradjenta 5 mg Daily Current orders for Inpatient glycemic control:  Novolog 0-15 units tid  A1c elevated at 9.6% this admission  Inpatient Diabetes Program Recommendations:    -  Start lantus 10 units while inpatient  Will see pt for titration of medications at home, just started [place] on 3/18. May benefit from GLP injectable outpatient.  Addendum 2:33 pm  Spoke with pt at bedside regarding his A1c level and glucose control at home. Pt recently is relocating to [place].  He had established care with a new PCP Cathi Roan, PA. Pt had been taking metfomrin 500 mg bid and glipizide 5 mg bid. It was suggested and prescribed that he would take Tradjenta 5 mg Daily, however pt reports it being $250 and he could not pay for that. Discussed with pt not many options of oral medication that would not effect his kidneys the next steps would be injectable of GLP-1 or insulin in his future for glycemic management. Pt also reports he could to better with lifestyle choices. Pt wants to think about it amd speak with his PCP about these options at his hospital follow up. Pt reports he thinks he has an appointment scheduled for the middle of may for a follow up anyway. Will attach [employer] p[rice list of insulins at [facility] as pt reports this may be an affordable option if insurance does not help on the cost. Pt does want to do whatever is better for his health. I general showed him how an insulin works.  Thanks,  Tama Headings RN,  MSN, BC-ADM Inpatient Diabetes Coordinator Team Pager [phone] (8a-5p)

## 2020-11-21 NOTE — Progress Notes (Signed)
Progress Note  Patient Name: Phillip Oconnor Date of Encounter: 11/21/2020  Attending physician: Kayleen Memos, DO Primary care provider: Jenel Lucks, PA-C Primary Cardiologist: NA Consultant:Arlee Bossard Terri Skains, DO  Subjective: Phillip Oconnor is a 80 y.o. male who was seen and examined at bedside at approximately 7:30 AM No events overnight. Patient denies chest pain, shortness of breath, orthopnea, paroxysmal nocturnal dyspnea, lower extremity swelling. No prior history of gastrointestinal or intracranial bleeding. Patient does not recall history of anemia.  Patient states that he has had a colonoscopy in the recent past. Reviewed the physical chart, no outside records available.  Objective: Vital Signs in the last 24 hours: Temp:  [97.5 F (36.4 C)] 97.5 F (36.4 C) (04/06 2100) Pulse Rate:  [69-82] 82 (04/07 0849) Resp:  [18] 18 (04/06 2100) BP: (113-154)/(53-63) 154/62 (04/07 0849) SpO2:  [93 %-97 %] 93 % (04/06 2100)  Intake/Output:  Intake/Output Summary (Last 24 hours) at 11/21/2020 0918 Last data filed at 11/21/2020 0506 Gross per 24 hour  Intake 786 ml  Output 850 ml  Net -64 ml    Net IO Since Admission: -2,180.67 mL [11/21/20 0918]  Weights:  Filed Weights   11/19/20 1505 11/20/20 0251  Weight: 98.3 kg 93.3 kg    Telemetry: Personally reviewed.  Atrial flutter.  Physical examination: PHYSICAL EXAM: Vitals with BMI 11/21/2020 11/20/2020 11/20/2020  Height - - -  Weight - - -  BMI - - -  Systolic 696 295 284  Diastolic 62 54 63  Pulse 82 70 71    CONSTITUTIONAL: Well-developed and well-nourished. No acute distress.  SKIN: Skin is warm and dry. No rash noted. No cyanosis. No pallor. No jaundice HEAD: Normocephalic and atraumatic.  EYES: No scleral icterus MOUTH/THROAT: Moist oral membranes.  NECK: No JVD present. No thyromegaly noted. No carotid bruits  LYMPHATIC: No visible cervical adenopathy.  CHEST Normal respiratory effort. No intercostal  retractions. Loop recorder site is C/D/I LUNGS: Clear to auscultation bilaterally. No stridor. No wheezes. No rales.  CARDIOVASCULAR: Irregularly irregular, variable S1-S2, no murmurs rubs or gallops appreciated. ABDOMINAL: Soft, nontender, nondistended, positive bowel sounds in all 4 quadrants, no apparent ascites.  EXTREMITIES: Trace bilateral peripheral edema compression stockings present. HEMATOLOGIC: No significant bruising NEUROLOGIC: Oriented to person, place, and time. Nonfocal. Normal muscle tone.  PSYCHIATRIC: Normal mood and affect. Normal behavior. Cooperative.  Lab Results: Hematology Recent Labs  Lab 11/19/20 1652 11/20/20 0817 11/20/20 1740 11/21/20 0340  WBC 19.3* 17.2*  --  11.2*  RBC 3.63* 3.07*  --  2.85*  HGB 11.6* 9.6* 9.8* 9.0*  HCT 33.7* 28.4* 28.4* 26.4*  MCV 92.8 92.5  --  92.6  MCH 32.0 31.3  --  31.6  MCHC 34.4 33.8  --  34.1  RDW 13.3 13.2  --  13.1  PLT 386 349  --  365    Chemistry Recent Labs  Lab 11/19/20 1650 11/19/20 1652 11/20/20 0817 11/21/20 0340  NA  --  134* 135 133*  K  --  3.6 3.4* 3.2*  CL  --  100 100 100  CO2  --  22 25 24   GLUCOSE  --  280* 250* 222*  BUN  --  19 13 16   CREATININE  --  1.53* 1.42* 1.60*  CALCIUM  --  9.5 8.9 8.4*  PROT 7.3  --  5.6*  --   ALBUMIN 3.4*  --  2.6*  --   AST 30  --  20  --  ALT 29  --  21  --   ALKPHOS 132*  --  100  --   BILITOT 1.0  --  1.0  --   GFRNONAA  --  45* 50* 43*  ANIONGAP  --  12 10 9      Cardiac Enzymes: Cardiac Panel (last 3 results) Recent Labs    11/19/20 1652 11/19/20 1930  TROPONINIHS 46* 48*    BNP (last 3 results) Recent Labs    11/19/20 1650  BNP 897.2*    ProBNP (last 3 results) No results for input(s): PROBNP in the last 8760 hours.   DDimer No results for input(s): DDIMER in the last 168 hours.   Hemoglobin A1c:  Lab Results  Component Value Date   HGBA1C 9.6 (H) 11/21/2020   MPG 228.82 11/21/2020    TSH  Recent Labs    11/19/20 1652   TSH 2.299    Lipid Panel No results found for: CHOL, TRIG, HDL, CHOLHDL, VLDL, LDLCALC, LDLDIRECT  Imaging: DG Chest 2 View  Result Date: 11/19/2020 CLINICAL DATA:  Atrial fibrillation. EXAM: CHEST - 2 VIEW COMPARISON:  None. FINDINGS: The heart size and mediastinal contours are within normal limits. No pneumothorax is noted. Small bilateral pleural effusions are noted. No consolidative process is noted. The visualized skeletal structures are unremarkable. IMPRESSION: Small bilateral pleural effusions. Electronically Signed   By: Marijo Conception M.D.   On: 11/19/2020 18:13   CT Head Wo Contrast  Result Date: 11/19/2020 CLINICAL DATA:  Stroke follow-up EXAM: CT HEAD WITHOUT CONTRAST TECHNIQUE: Contiguous axial images were obtained from the base of the skull through the vertex without intravenous contrast. COMPARISON:  None. FINDINGS: Brain: Mild atrophy. Negative for hydrocephalus. Chronic infarct right frontal lobe. Hypodensity in the frontal white matter bilaterally right greater than left. Negative for acute cortical infarct, hemorrhage, mass. Vascular: Negative for hyperdense vessel Skull: Negative Sinuses/Orbits: Mucoperiosteal thickening right maxillary sinus. Negative orbit Other: None IMPRESSION: No acute abnormality.  Chronic infarct right frontal lobe. Electronically Signed   By: Franchot Gallo M.D.   On: 11/19/2020 20:25   ECHOCARDIOGRAM COMPLETE  Result Date: 11/20/2020    ECHOCARDIOGRAM REPORT   Patient Name:   Phillip Oconnor Date of Exam: 11/20/2020 Medical Rec #:  [id number]      Height:       76.0 in Accession #:    [id number]     Weight:       205.7 lb Date of Birth:  01-Aug-1941      BSA:          2.242 m Patient Age:    61 years       BP:           140/56 mmHg Patient Gender: M              HR:           66 bpm. Exam Location:  Inpatient Procedure: 2D Echo, Cardiac Doppler and Color Doppler Indications:    Atrial fibrillation  History:        Patient has no prior history of  Echocardiogram examinations.                 Stroke; Risk Factors:Hypertension, Diabetes and hyperlipidemia.  Sonographer:    Luisa Hart RDCS Referring Phys: 96 JARED M GARDNER  Sonographer Comments: Technically difficult study due to poor echo windows. IMPRESSIONS  1. Left ventricular ejection fraction, by estimation, is 70 to 75%. The left ventricle has hyperdynamic function.  The left ventricle has no regional wall motion abnormalities. There is mild left ventricular hypertrophy. Left ventricular diastolic parameters are indeterminate.  2. Right ventricular systolic function is normal. The right ventricular size is normal. There is mildly elevated pulmonary artery systolic pressure. The estimated right ventricular systolic pressure is 54.5 mmHg.  3. The mitral valve is grossly normal. Trivial mitral valve regurgitation.  4. The aortic valve is tricuspid. Aortic valve regurgitation is not visualized. Mild aortic valve sclerosis is present, with no evidence of aortic valve stenosis.  5. Evidence of atrial level shunting detected by color flow Doppler. Agitated saline contrast bubble study was positive with shunting observed within 3-6 cardiac cycles suggestive of interatrial shunt. 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 Comparison(s): No prior Echocardiogram. FINDINGS  Left Ventricle: Left ventricular ejection fraction, by estimation, is 70 to 75%. The left ventricle has hyperdynamic function. The left ventricle has no regional wall motion abnormalities. The left ventricular internal cavity size was normal in size. There is mild left ventricular hypertrophy. Left ventricular diastolic function could not be evaluated due to atrial fibrillation. Left ventricular diastolic parameters are indeterminate. Right Ventricle: The right ventricular size is normal. No increase in right ventricular wall thickness. Right ventricular systolic function is  normal. There is mildly elevated pulmonary artery systolic pressure. The tricuspid regurgitant velocity is 2.99  m/s, and with an assumed right atrial pressure of 3 mmHg, the estimated right ventricular systolic pressure is 62.5 mmHg. Left Atrium: Left atrial size was normal in size. Right Atrium: Right atrial size was normal in size. Pericardium: There is no evidence of pericardial effusion. Mitral Valve: The mitral valve is grossly normal. Trivial mitral valve regurgitation. Tricuspid Valve: The tricuspid valve is grossly normal. Tricuspid valve regurgitation is trivial. Aortic Valve: The aortic valve is tricuspid. Aortic valve regurgitation is not visualized. Mild aortic valve sclerosis is present, with no evidence of aortic valve stenosis. Aortic valve mean gradient measures 3.0 mmHg. Aortic valve peak gradient measures 5.8 mmHg. Aortic valve area, by VTI measures 2.49 cm. Pulmonic Valve: The pulmonic valve was grossly normal. Pulmonic valve regurgitation is trivial. Aorta: The aortic root and ascending aorta are structurally normal, with no evidence of dilitation. IAS/Shunts: The interatrial septum is aneurysmal. Evidence of atrial level shunting detected by color flow Doppler. Agitated saline contrast bubble study was positive with shunting observed within 3-6 cardiac cycles suggestive of interatrial shunt. A small patent foramen ovale is detected with predominantly left to right shunting across the atrial septum.  LEFT VENTRICLE PLAX 2D LVIDd:         4.90 cm     Diastology LVIDs:         3.00 cm     LV e' medial:    9.37 cm/s LV PW:         1.10 cm     LV E/e' medial:  9.7 LV IVS:        1.40 cm     LV e' lateral:   12.60 cm/s LVOT diam:     2.10 cm     LV E/e' lateral: 7.2 LV SV:         63 LV SV Index:   28 LVOT Area:     3.46 cm  LV Volumes (MOD) LV vol d, MOD A2C: 68.6 ml LV vol d, MOD A4C: 56.8 ml LV vol s, MOD A2C: 19.1 ml LV vol s, MOD A4C: 13.3 ml LV  SV MOD A2C:     49.5 ml LV SV MOD A4C:     56.8  ml LV SV MOD BP:      47.0 ml RIGHT VENTRICLE RV S prime:     18.50 cm/s TAPSE (M-mode): 2.4 cm LEFT ATRIUM           Index LA diam:      3.60 cm 1.61 cm/m LA Vol (A4C): 37.1 ml 16.55 ml/m  AORTIC VALVE                   PULMONIC VALVE AV Area (Vmax):    2.56 cm    PV Vmax:       1.56 m/s AV Area (Vmean):   2.35 cm    PV Vmean:      101.000 cm/s AV Area (VTI):     2.49 cm    PV VTI:        0.271 m AV Vmax:           120.00 cm/s PV Peak grad:  9.7 mmHg AV Vmean:          85.900 cm/s PV Mean grad:  5.0 mmHg AV VTI:            0.253 m AV Peak Grad:      5.8 mmHg AV Mean Grad:      3.0 mmHg LVOT Vmax:         88.60 cm/s LVOT Vmean:        58.200 cm/s LVOT VTI:          0.182 m LVOT/AV VTI ratio: 0.72  AORTA Ao Root diam: 3.60 cm Ao Asc diam:  3.90 cm MITRAL VALVE               TRICUSPID VALVE MV Area (PHT): 4.21 cm    TR Peak grad:   35.8 mmHg MV Decel Time: 180 msec    TR Vmax:        299.00 cm/s MV E velocity: 91.30 cm/s MV A velocity: 46.00 cm/s  SHUNTS MV E/A ratio:  1.98        Systemic VTI:  0.18 m                            Systemic Diam: 2.10 cm Lyman Bishop MD Electronically signed by Lyman Bishop MD Signature Date/Time: 11/20/2020/3:36:07 PM    Final     Cardiac database: EKG: 11/19/2020: Atypical atrial flutter with variable conduction, 65 bpm, underlying right bundle branch block, 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  Echocardiogram: 11/20/2020: 1. Left ventricular ejection fraction, by estimation, is 70 to 75%. The  left ventricle has hyperdynamic function. The left ventricle has no  regional wall motion abnormalities. There is mild left ventricular  hypertrophy. Left ventricular diastolic  parameters are indeterminate.  2. Right ventricular systolic function is normal. The right ventricular  size is normal. There is mildly elevated pulmonary artery systolic  pressure. The estimated right ventricular systolic pressure is 29.5 mmHg.  3. The mitral valve  is grossly normal. Trivial mitral valve  regurgitation.  4. The aortic valve is tricuspid. Aortic valve regurgitation is not  visualized. Mild aortic valve sclerosis is present, with no evidence of  aortic valve stenosis.  5. Evidence of atrial level shunting detected by color flow Doppler.  Agitated saline contrast bubble study was positive with shunting observed  within 3-6 cardiac cycles suggestive of interatrial shunt.  There is a  small patent foramen ovale with  predominantly left to right shunting across the atrial septum.  Scheduled Meds: . atorvastatin  80 mg Oral Daily  . doxazosin  4 mg Oral Daily  . insulin aspart  0-15 Units Subcutaneous TID WC  . levETIRAcetam  500 mg Oral BID  . metoprolol succinate  50 mg Oral Daily  . nitroGLYCERIN  1 inch Topical Q6H  . potassium chloride  40 mEq Oral Once  . senna-docusate  2 tablet Oral Daily    Continuous Infusions: . heparin 1,750 Units/hr (11/21/20 0746)    PRN Meds: acetaminophen, hydrALAZINE, ondansetron (ZOFRAN) IV   IMPRESSION & RECOMMENDATIONS: Phillip Oconnor is a 80 y.o. male whose past medical history and cardiac risk factors include: Hypertension, hyperlipidemia, diabetes mellitus type 2, history of stroke (per imaging CT head without contrast 11/19/2020), atrial flutter.  Impression & Plan: Atypical atrial flutter: Rate controlled. Transition him from Procardia to Toprol-XL 50 mg p.o. every morning starting today Rhythm control: N/A Thromboembolic prophylaxis: Currently on IV heparin drip.  Continue IV heparin until his work-up of anemia is complete Continue telemetry TSH within normal limits Telemetry reviewed  Long-term oral anticoagulation: Indication: Atrial flutter. Discussed the risks, benefits and alternatives to oral anticoagulation.   Patient is agreeable to transitioning to oral anticoagulation given his CHA2DS2-VASc score.   CHA2DS2-VASc SCORE is 6 which correlates to 9.7 % risk of stroke per  year (Age, HTN, DM, hx of stroke). Reemphasize the risks, benefits, and alternatives to anticoagulation.  Patient verbalizes understanding.  His questions concerns addressed. HB trends: 11.6 > 9.6> 9.8> 9.0 today.  FOBT ordered by primary team, pending  Would recommend anemia work up vs. GI consult prior to transition to [facility].  Recommend transitioning to oral anticoagulation once his work-up of anemia is complete.  LE swelling: Improving Clinically patient does not appear to be in congestive heart failure. No JVP on examination or vascular congestion on chest x-ray. Echocardiogram notes preserved LVEF without any significant valvular heart disease. Elevated BNP may be secondary to his underlying atrial flutter. Lower extremity swelling may be secondary to chronic venous insufficiency.  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 Transition from Procardia to Toprol XL  Echo results reviewed.   Small PFO:  Noted on a bubble study. Monitor for now.   Benign essential hypertension: Per primary team.  Hyperlipidemia: continue statin therapy. Fasting lipids in am.   Non-insulin-dependent diabetes mellitus type 2: per primary team.   History of stroke (per imaging, but patient denies such history): d/c ASA 325mg  po qday. Will hold ASA 81mg  po qday due to initiation of AC and down treading Hb. Continue statin. Secondary prevention recommended.   Former smoker. Educated on the importance of continued smoking cessation.  Patient's questions and concerns were addressed to his satisfaction. He voices understanding of the instructions provided during this encounter.   This note was created using a voice recognition software as a result there may be grammatical errors inadvertently enclosed that do not reflect the nature of this encounter. Every attempt is made to correct such errors.  Rex Kras, DO, [facility]. [facility] Office: [phone] 11/21/2020, 9:18 AM

## 2020-11-21 NOTE — Progress Notes (Addendum)
PROGRESS NOTE  Phillip Oconnor IRW:[id number] DOB: [birth year] DOA: 11/19/2020 PCP: Jenel Lucks, PA-C  HPI/Recap of past 24 hours: Phillip Oconnor a 80 y.o.malewith medical history significant ofDM2, HTN, HLD. Presented to [facility] ED after receiving a call that his loop recorder was showing A. fib and that he needed to come to the hospital.  He drove himself to [facility].  He had recently been treated for urinary tract infection, completed his 10-day course of oral antibiotics on Saturday, 11/17/2020, was otherwise in his usual state of health.  Upon presentation to the ED, twelve-lead EKG showing a flutter, BNP elevated 800, small bilateral pleural effusions on chest x-ray.    Was started on heparin drip and received 1 dose of IV Lasix 40 mg.  Cardiology was consulted by EDP.  Hospitalist team asked to admit.  Hospital course complicated by acute blood loss anemia of unclear etiology.  Ongoing anemia work-up.  11/21/20: [name]  He denies having any palpitations or chest pain or shortness of breath while at rest.  He is currently on heparin drip.  Rate is controlled.   Assessment/Plan: Principal Problem:   Atrial flutter, paroxysmal (HCC) Active Problems:   DM2 (diabetes mellitus, type 2) (HCC)   HTN (hypertension)   History of multiple strokes   Seizure (HCC)  Atypical atrial flutter CHA2DS2-VASc of 6 Currently rate controlled on Toprol-XL 50 mg daily Repeat twelve-lead EKG. On heparin drip for secondary CVA prevention Seen by cardiology 2D echo done on 11/20/2020 showed LVEF 70 to 75%, no regional wall motion abnormalities.  Positive bubble study.  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  Prolonged QTC Admission EKG revealed QTC greater than 500 Optimize magnesium and potassium levels Avoid QTC prolonging agents Repeat twelve-lead EKG on 11/21/2020  Acute blood loss  anemia, unclear etiology Initially presented with hemoglobin of 11.6 Today hemoglobin 9.0, gradually downtrending Repeat CBC this afternoon Obtain iron studies, reticulocyte count, folate and B12 levels Obtain FOBT No overt bleeding Exam is benign. Continue to monitor H&H  Essential hypertension BP is currently at goal Continue to hold off home losartan and home Procardia Continue to monitor vital signs.  History of seizure disorder Continue home Keppra Seizure precautions  Hyperlipidemia Continue home Lipitor  BPH Continue home Cardura Monitor urine output  History of CVA Continue fall precautions PT OT to assess    Code Status: Full code.  Family Communication: None at bedside.  Disposition Plan: Likely discharge to home when cardiology signs off   Consultants:  Cardiology.  Procedures:  2D echo.  Antimicrobials:  None.  DVT prophylaxis: Heparin drip.  Status is: Inpatient    Dispo: The patient is from: Home.               Anticipated d/c is to: Home.              Patient currently not stable for discharge, ongoing management of acute blood loss anemia and atypical a flutter on heparin drip.   Difficult to place patient: Not applicable.        Objective: Vitals:   11/20/20 1612 11/20/20 2100 11/21/20 0849 11/21/20 1419  BP: 132/63 (!) 113/54 (!) 154/62 121/61  Pulse: 71 70 82 67  Resp: 18 18  17   Temp: (!) 97.5 F (36.4 C) (!) 97.5 F (36.4 C)  98.1 F (36.7 C)  TempSrc: Oral Oral  Oral  SpO2: 97% 93%  98%  Weight:      Height:        Intake/Output Summary (Last 24 hours) at 11/21/2020 1616 Last data filed at 11/21/2020 0506 Gross per 24 hour  Intake 426 ml  Output 850 ml  Net -424 ml   Filed Weights   11/19/20 1505 11/20/20 0251  Weight: 98.3 kg 93.3 kg    Exam:  . General: 80 y.o. year-old male well developed well nourished in no acute distress.  Alert and oriented x3. . Cardiovascular: Regular rate and rhythm with no  rubs or gallops.  No thyromegaly or JVD noted.   Marland Kitchen Respiratory: Clear to auscultation with no wheezes or rales. Good inspiratory effort. . Abdomen: Soft nontender nondistended with normal bowel sounds x4 quadrants. . Musculoskeletal: No lower extremity edema. 2/4 pulses in all 4 extremities. . Skin: No ulcerative lesions noted or rashes, . Psychiatry: Mood is appropriate for condition and setting   Data Reviewed: CBC: Recent Labs  Lab 11/19/20 1652 11/20/20 0817 11/20/20 1740 11/21/20 0340  WBC 19.3* 17.2*  --  11.2*  NEUTROABS  --  14.3*  --   --   HGB 11.6* 9.6* 9.8* 9.0*  HCT 33.7* 28.4* 28.4* 26.4*  MCV 92.8 92.5  --  92.6  PLT 386 349  --  144   Basic Metabolic Panel: Recent Labs  Lab 11/19/20 1652 11/20/20 0817 11/21/20 0340  NA 134* 135 133*  K 3.6 3.4* 3.2*  CL 100 100 100  CO2 22 25 24   GLUCOSE 280* 250* 222*  BUN 19 13 16   CREATININE 1.53* 1.42* 1.60*  CALCIUM 9.5 8.9 8.4*  MG  --  1.4*  --    GFR: Estimated Creatinine Clearance: 44.5 mL/min (A) (by C-G formula based on SCr of 1.6 mg/dL (H)). Liver Function Tests: Recent Labs  Lab 11/19/20 1650 11/20/20 0817  AST 30 20  ALT 29 21  ALKPHOS 132* 100  BILITOT 1.0 1.0  PROT 7.3 5.6*  ALBUMIN 3.4* 2.6*   Recent Labs  Lab 11/19/20 1650  LIPASE 36   No results for input(s): AMMONIA in the last 168 hours. Coagulation Profile: Recent Labs  Lab 11/19/20 1652  INR 1.1   Cardiac Enzymes: No results for input(s): CKTOTAL, CKMB, CKMBINDEX, TROPONINI in the last 168 hours. BNP (last 3 results) No results for input(s): PROBNP in the last 8760 hours. HbA1C: Recent Labs    11/20/20 0301 11/21/20 0340  HGBA1C 9.3* 9.6*   CBG: Recent Labs  Lab 11/20/20 1202 11/20/20 1721 11/20/20 2255 11/21/20 0832 11/21/20 1152  GLUCAP 195* 293* 249* 195* 180*   Lipid Profile: No results for input(s): CHOL, HDL, LDLCALC, TRIG, CHOLHDL, LDLDIRECT in the last 72 hours. Thyroid Function Tests: Recent Labs     11/19/20 1652  TSH 2.299   Anemia Panel: No results for input(s): VITAMINB12, FOLATE, FERRITIN, TIBC, IRON, RETICCTPCT in the last 72 hours. Urine analysis:    Component Value Date/Time   COLORURINE YELLOW 11/19/2020 1816   APPEARANCEUR CLEAR 11/19/2020 1816   LABSPEC 1.020 11/19/2020 1816   PHURINE 5.5 11/19/2020 1816   GLUCOSEU >=500 (A) 11/19/2020 1816   HGBUR NEGATIVE 11/19/2020 1816   BILIRUBINUR NEGATIVE 11/19/2020 1816   KETONESUR NEGATIVE 11/19/2020 1816   PROTEINUR NEGATIVE 11/19/2020 1816   NITRITE NEGATIVE 11/19/2020 1816   LEUKOCYTESUR NEGATIVE 11/19/2020 1816   Sepsis Labs: @LABRCNTIP (procalcitonin:4,lacticidven:4)  ) Recent Results (from the past 240 hour(s))  Resp Panel by RT-PCR (Flu A&B, Covid) Nasopharyngeal Swab  Status: None   Collection Time: 11/19/20  6:13 PM   Specimen: Nasopharyngeal Swab; Nasopharyngeal(NP) swabs in vial transport medium  Result Value Ref Range Status   SARS Coronavirus 2 by RT PCR NEGATIVE NEGATIVE Final    Comment: (NOTE) SARS-CoV-2 target nucleic acids are NOT DETECTED.  The SARS-CoV-2 RNA is generally detectable in upper respiratory specimens during the acute phase of infection. The lowest concentration of SARS-CoV-2 viral copies this assay can detect is 138 copies/mL. A negative result does not preclude SARS-Cov-2 infection and should not be used as the sole basis for treatment or other patient management decisions. A negative result may occur with  improper specimen collection/handling, submission of specimen other than nasopharyngeal swab, presence of viral mutation(s) within the areas targeted by this assay, and inadequate number of viral copies(<138 copies/mL). A negative result must be combined with clinical observations, patient history, and epidemiological information. The expected result is Negative.  Fact Sheet for Patients:  EntrepreneurPulse.com.au  Fact Sheet for Healthcare Providers:   IncredibleEmployment.be  This test is no t yet approved or cleared by the [place] FDA and  has been authorized for detection and/or diagnosis of SARS-CoV-2 by FDA under an Emergency Use Authorization (EUA). This EUA will remain  in effect (meaning this test can be used) for the duration of the COVID-19 declaration under Section 564(b)(1) of the Act, 21 U.S.C.section 360bbb-3(b)(1), unless the authorization is terminated  or revoked sooner.       Influenza A by PCR NEGATIVE NEGATIVE Final   Influenza B by PCR NEGATIVE NEGATIVE Final    Comment: (NOTE) The Xpert Xpress SARS-CoV-2/FLU/RSV plus assay is intended as an aid in the diagnosis of influenza from Nasopharyngeal swab specimens and should not be used as a sole basis for treatment. Nasal washings and aspirates are unacceptable for Xpert Xpress SARS-CoV-2/FLU/RSV testing.  Fact Sheet for Patients: EntrepreneurPulse.com.au  Fact Sheet for Healthcare Providers: IncredibleEmployment.be  This test is not yet approved or cleared by the [place] FDA and has been authorized for detection and/or diagnosis of SARS-CoV-2 by FDA under an Emergency Use Authorization (EUA). This EUA will remain in effect (meaning this test can be used) for the duration of the COVID-19 declaration under Section 564(b)(1) of the Act, 21 U.S.C. section 360bbb-3(b)(1), unless the authorization is terminated or revoked.  Performed at [facility], [address] [place], [place] [address]   Culture, Urine     Status: Abnormal   Collection Time: 11/20/20 12:00 PM   Specimen: Urine, Random  Result Value Ref Range Status   Specimen Description URINE, RANDOM  Final   Special Requests NONE  Final   Culture (A)  Final    <10,000 COLONIES/mL INSIGNIFICANT GROWTH Performed at [facility], [place] [address], [place], [place] [address]    Report Status 11/21/2020 FINAL  Final       Studies: No results found.  Scheduled Meds: . atorvastatin  80 mg Oral Daily  . doxazosin  4 mg Oral Daily  . insulin aspart  0-15 Units Subcutaneous TID WC  . levETIRAcetam  500 mg Oral BID  . metoprolol succinate  50 mg Oral Daily  . nitroGLYCERIN  1 inch Topical Q6H  . senna-docusate  2 tablet Oral Daily    Continuous Infusions: . heparin 1,750 Units/hr (11/21/20 1231)     LOS: 1 day     Kayleen Memos, MD [facility] Pager [phone]  If 7PM-7AM, please contact night-coverage [email or web address] Password [email or web address]  11/21/2020, 4:16 PM

## 2020-11-21 NOTE — Progress Notes (Signed)
ANTICOAGULATION CONSULT NOTE - Follow Up Consult  Pharmacy Consult for Heparin Indication: atrial fibrillation  Allergies  Allergen Reactions  . Penicillins Rash and Swelling    Lips swell  . Plavix [Clopidogrel] Palpitations    Patient Measurements: Height: 6\' 4"  (193 cm) Weight: 93.3 kg (205 lb 11 oz) IBW/kg (Calculated) : 86.8 Heparin Dosing Weight:    Vital Signs: BP: 154/62 (04/07 0849) Pulse Rate: 82 (04/07 0849)  Labs: Recent Labs    11/19/20 1652 11/19/20 1930 11/20/20 0817 11/20/20 1039 11/20/20 1740 11/20/20 2047 11/21/20 0340  HGB 11.6*  --  9.6*  --  9.8*  --  9.0*  HCT 33.7*  --  28.4*  --  28.4*  --  26.4*  PLT 386  --  349  --   --   --  365  LABPROT 14.0  --   --   --   --   --   --   INR 1.1  --   --   --   --   --   --   HEPARINUNFRC  --   --   --  0.27*  --  0.53 <0.10*  CREATININE 1.53*  --  1.42*  --   --   --  1.60*  TROPONINIHS 46* 48*  --   --   --   --   --     Estimated Creatinine Clearance: 44.5 mL/min (A) (by C-G formula based on SCr of 1.6 mg/dL (H)).  Assessment: 80 yo male on heparin for afib (hx CVA). No AC PTA. Plans noted for apixaban when anemia workup has been completed -heparin level < 0.1 (heparin paused for IV line change) -hg 9.0 (trend down from 11.6 on 4/5)  Goal of Therapy:  Heparin level 0.3-0.7 units/ml Monitor platelets by anticoagulation protocol: Yes   Plan:  -Small heparin bolus 1000 units/hr -Increase heparin to 1750 units/hr -Heparin level in 8 hours and daily wth CBC daily  Hildred Laser, PharmD Clinical Pharmacist **Pharmacist phone directory can now be found on [email or web address] (PW [email or web address]).  Listed under [place].

## 2020-11-21 NOTE — Progress Notes (Signed)
ANTICOAGULATION CONSULT NOTE - Follow Up Consult  Pharmacy Consult for Heparin Indication: atrial fibrillation  Allergies  Allergen Reactions  . Penicillins Rash and Swelling    Lips swell  . Plavix [Clopidogrel] Palpitations    Patient Measurements: Height: 6\' 4"  (193 cm) Weight: 93.3 kg (205 lb 11 oz) IBW/kg (Calculated) : 86.8 Heparin Dosing Weight:    Vital Signs: Temp: 98.1 F (36.7 C) (04/07 1419) Temp Source: Oral (04/07 1419) BP: 121/61 (04/07 1419) Pulse Rate: 67 (04/07 1419)  Labs: Recent Labs    11/19/20 1652 11/19/20 1930 11/20/20 0817 11/20/20 1039 11/20/20 1740 11/20/20 2047 11/21/20 0340 11/21/20 1610  HGB 11.6*  --  9.6*  --  9.8*  --  9.0*  --   HCT 33.7*  --  28.4*  --  28.4*  --  26.4*  --   PLT 386  --  349  --   --   --  365  --   LABPROT 14.0  --   --   --   --   --   --   --   INR 1.1  --   --   --   --   --   --   --   HEPARINUNFRC  --   --   --    < >  --  0.53 <0.10* 0.59  CREATININE 1.53*  --  1.42*  --   --   --  1.60*  --   TROPONINIHS 46* 48*  --   --   --   --   --   --    < > = values in this interval not displayed.    Estimated Creatinine Clearance: 44.5 mL/min (A) (by C-G formula based on SCr of 1.6 mg/dL (H)).  Assessment: 80 yo male on heparin for afib (hx CVA). No AC PTA. Plans noted for apixaban when anemia workup has been completed -heparin level < 0.1 (heparin paused for IV line change) -hg 9.0 (trend down from 11.6 on 4/5) Heparin level 0.59 at goal.  Goal of Therapy:  Heparin level 0.3-0.7 units/ml Monitor platelets by anticoagulation protocol: Yes   Plan:  Continue heparin 1750 units/hr Recheck heparin level with morning labs -Heparin level daily wth CBC daily  [facility] [place] [facility] PharmD Candidate

## 2020-11-21 NOTE — Evaluation (Signed)
Physical Therapy Evaluation Patient Details Name: Phillip Oconnor MRN: [id number] DOB: 04-Nov-1940 Today's Date: 11/21/2020   History of Present Illness  pt is an 80 y/o male presenting 4/5 after he got a call that his loop recorder was showing afib and to go to the hospital.  Work up including atypical atrial flutter, prolonged QTC and ABLA.  PMHx:  stroke, seizures, HTN, DM2  Clinical Impression  Pt is at or close to baseline functioning and should be safe at home . There are no further acute PT needs.  Will sign off at this time.     Follow Up Recommendations No PT follow up    Equipment Recommendations  None recommended by PT    Recommendations for Other Services       Precautions / Restrictions Precautions Precautions: None      Mobility  Bed Mobility Overal bed mobility: Independent                  Transfers Overall transfer level: Independent                  Ambulation/Gait Ambulation/Gait assistance: Modified independent (Device/Increase time);Independent Gait Distance (Feet): 500 Feet Assistive device: None Gait Pattern/deviations: Step-through pattern   Gait velocity interpretation: 1.31 - 2.62 ft/sec, indicative of limited community ambulator General Gait Details: steady with moderate speed, no overt LOB/deviation with moderate challenge including scanning, turns, backing up, stairs.  Stairs Stairs: Yes Stairs assistance: Modified independent (Device/Increase time) Stair Management: One rail Right;Alternating pattern;Forwards Number of Stairs: 3 General stair comments: safe with rail.  Wheelchair Mobility    Modified Rankin (Stroke Patients Only)       Balance Overall balance assessment: Modified Independent;No apparent balance deficits (not formally assessed)                                           Pertinent Vitals/Pain Pain Assessment: No/denies pain    Home Living Family/patient expects to be discharged  to:: Private residence Living Arrangements: Alone Available Help at Discharge: Family;Available PRN/intermittently (to drive for him) Type of Home: Apartment Home Access: Stairs to enter Entrance Stairs-Rails: Psychiatric nurse of Steps: 1 flight Home Layout: One level Home Equipment: None      Prior Function Level of Independence: Independent         Comments: does all adl's/iadl's per pt except drive due to seizure     Hand Dominance        Extremity/Trunk Assessment   Upper Extremity Assessment Upper Extremity Assessment: Overall WFL for tasks assessed    Lower Extremity Assessment Lower Extremity Assessment: Overall WFL for tasks assessed    Cervical / Trunk Assessment Cervical / Trunk Assessment: Normal  Communication   Communication: No difficulties  Cognition Arousal/Alertness: Awake/alert Behavior During Therapy: WFL for tasks assessed/performed Overall Cognitive Status: Within Functional Limits for tasks assessed                                        General Comments General comments (skin integrity, edema, etc.): vss overall during gait    Exercises     Assessment/Plan    PT Assessment Patent does not need any further PT services  PT Problem List         PT Treatment Interventions  PT Goals (Current goals can be found in the Care Plan section)  Acute Rehab PT Goals Patient Stated Goal: Figure out the problems before home. PT Goal Formulation: All assessment and education complete, DC therapy    Frequency     Barriers to discharge        Co-evaluation               AM-PAC PT "6 Clicks" Mobility  Outcome Measure Help needed turning from your back to your side while in a flat bed without using bedrails?: None Help needed moving from lying on your back to sitting on the side of a flat bed without using bedrails?: None Help needed moving to and from a bed to a chair (including a wheelchair)?:  None Help needed standing up from a chair using your arms (e.g., wheelchair or bedside chair)?: None Help needed to walk in hospital room?: None Help needed climbing 3-5 steps with a railing? : None 6 Click Score: 24    End of Session   Activity Tolerance: Patient tolerated treatment well Patient left: in bed;with call bell/phone within reach Nurse Communication: Mobility status PT Visit Diagnosis: Other abnormalities of gait and mobility (R26.89)    Time: 1851-1910 PT Time Calculation (min) (ACUTE ONLY): 19 min   Charges:   PT Evaluation $PT Eval Low Complexity: 1 Low          11/21/2020  Ginger Carne., PT Acute Rehabilitation Services [phone]  (pager) [phone]  (office)  Tessie Fass Jash Wahlen 11/21/2020, 7:22 PM

## 2020-11-22 ENCOUNTER — Other Ambulatory Visit (HOSPITAL_COMMUNITY): Payer: Self-pay

## 2020-11-22 DIAGNOSIS — Z7901 Long term (current) use of anticoagulants: Secondary | ICD-10-CM

## 2020-11-22 LAB — CBC
HCT: 24.8 % — ABNORMAL LOW (ref 39.0–52.0)
Hemoglobin: 8.5 g/dL — ABNORMAL LOW (ref 13.0–17.0)
MCH: 32.1 pg (ref 26.0–34.0)
MCHC: 34.3 g/dL (ref 30.0–36.0)
MCV: 93.6 fL (ref 80.0–100.0)
Platelets: 354 10*3/uL (ref 150–400)
RBC: 2.65 MIL/uL — ABNORMAL LOW (ref 4.22–5.81)
RDW: 13.2 % (ref 11.5–15.5)
WBC: 7.8 10*3/uL (ref 4.0–10.5)
nRBC: 0 % (ref 0.0–0.2)

## 2020-11-22 LAB — BASIC METABOLIC PANEL
Anion gap: 5 (ref 5–15)
BUN: 15 mg/dL (ref 8–23)
CO2: 25 mmol/L (ref 22–32)
Calcium: 8.2 mg/dL — ABNORMAL LOW (ref 8.9–10.3)
Chloride: 105 mmol/L (ref 98–111)
Creatinine, Ser: 1.43 mg/dL — ABNORMAL HIGH (ref 0.61–1.24)
GFR, Estimated: 49 mL/min — ABNORMAL LOW (ref >60)
Glucose, Bld: 210 mg/dL — ABNORMAL HIGH (ref 70–99)
Potassium: 3.8 mmol/L (ref 3.5–5.1)
Sodium: 135 mmol/L (ref 135–145)

## 2020-11-22 LAB — GLUCOSE, CAPILLARY
Glucose-Capillary: 150 mg/dL — ABNORMAL HIGH (ref 70–99)
Glucose-Capillary: 169 mg/dL — ABNORMAL HIGH (ref 70–99)
Glucose-Capillary: 104 mg/dL — ABNORMAL HIGH (ref 70–99)
Glucose-Capillary: 111 mg/dL — ABNORMAL HIGH (ref 70–99)

## 2020-11-22 LAB — OCCULT BLOOD X 1 CARD TO LAB, STOOL: Fecal Occult Bld: NEGATIVE

## 2020-11-22 LAB — HEMOGLOBIN AND HEMATOCRIT, BLOOD
HCT: 26.4 % — ABNORMAL LOW (ref 39.0–52.0)
Hemoglobin: 8.9 g/dL — ABNORMAL LOW (ref 13.0–17.0)

## 2020-11-22 LAB — HEPARIN LEVEL (UNFRACTIONATED): Heparin Unfractionated: 0.83 IU/mL — ABNORMAL HIGH (ref 0.30–0.70)

## 2020-11-22 MED ORDER — ATORVASTATIN CALCIUM 80 MG PO TABS
80.0000 mg | ORAL_TABLET | Freq: Every day | ORAL | 0 refills | Status: DC
  Filled 2020-11-22: qty 90, 90d supply, fill #0

## 2020-11-22 MED ORDER — INSULIN ASPART 100 UNIT/ML ~~LOC~~ SOLN
3.0000 [IU] | Freq: Three times a day (TID) | SUBCUTANEOUS | Status: DC
  Administered 2020-11-22: 3 [IU] via SUBCUTANEOUS

## 2020-11-22 MED ORDER — APIXABAN 5 MG PO TABS
5.0000 mg | ORAL_TABLET | Freq: Two times a day (BID) | ORAL | Status: DC
  Administered 2020-11-22 – 2020-11-25 (×7): 5 mg via ORAL
  Filled 2020-11-22 (×7): qty 1

## 2020-11-22 MED ORDER — CYANOCOBALAMIN 1000 MCG/ML IJ SOLN
1000.0000 ug | INTRAMUSCULAR | 0 refills | Status: DC
  Filled 2020-11-22: qty 1, 30d supply, fill #0

## 2020-11-22 MED ORDER — GLUCOSE BLOOD VI STRP
ORAL_STRIP | 12 refills | Status: DC
  Filled 2020-11-22: qty 100, fill #0

## 2020-11-22 MED ORDER — BLOOD GLUCOSE METER KIT
PACK | 0 refills | Status: DC
  Filled 2020-11-22: qty 1, 1d supply, fill #0

## 2020-11-22 MED ORDER — METOPROLOL SUCCINATE ER 50 MG PO TB24
50.0000 mg | ORAL_TABLET | Freq: Every day | ORAL | 11 refills | Status: DC
  Filled 2020-11-22: qty 30, 30d supply, fill #0

## 2020-11-22 MED ORDER — FOLIC ACID 1 MG PO TABS
1.0000 mg | ORAL_TABLET | Freq: Every day | ORAL | 0 refills | Status: DC
  Filled 2020-11-22: qty 90, 90d supply, fill #0

## 2020-11-22 MED ORDER — INSULIN ASPART 100 UNIT/ML FLEXPEN
3.0000 [IU] | PEN_INJECTOR | Freq: Three times a day (TID) | SUBCUTANEOUS | 0 refills | Status: DC
  Filled 2020-11-22: qty 3, 30d supply, fill #0

## 2020-11-22 MED ORDER — ACCU-CHEK SOFTCLIX LANCETS MISC
5 refills | Status: DC
  Filled 2020-11-22: qty 100, fill #0

## 2020-11-22 MED ORDER — APIXABAN 5 MG PO TABS
5.0000 mg | ORAL_TABLET | Freq: Two times a day (BID) | ORAL | 3 refills | Status: DC
  Filled 2020-11-22: qty 60, 30d supply, fill #0

## 2020-11-22 MED ORDER — LEVETIRACETAM 500 MG PO TABS
500.0000 mg | ORAL_TABLET | Freq: Two times a day (BID) | ORAL | 0 refills | Status: DC
  Filled 2020-11-22: qty 180, 90d supply, fill #0

## 2020-11-22 MED ORDER — PENTIPS 32G X 4 MM MISC
100.0000 | Freq: Three times a day (TID) | 0 refills | Status: DC
  Filled 2020-11-22: qty 100, 30d supply, fill #0

## 2020-11-22 MED ORDER — CYANOCOBALAMIN 1000 MCG/ML IJ SOLN
1000.0000 ug | INTRAMUSCULAR | Status: DC
  Administered 2020-11-22: 1000 ug via INTRAMUSCULAR
  Filled 2020-11-22: qty 1

## 2020-11-22 MED ORDER — INSULIN GLARGINE 100 UNIT/ML SOLOSTAR PEN
5.0000 [IU] | PEN_INJECTOR | Freq: Every day | SUBCUTANEOUS | 0 refills | Status: DC
  Filled 2020-11-22: qty 3, 30d supply, fill #0

## 2020-11-22 NOTE — Evaluation (Signed)
Occupational Therapy Evaluation Patient Details Name: Phillip Oconnor MRN: [id number] DOB: [birth year] Today's Date: 11/22/2020    History of Present Illness pt is an 80 y/o male presenting 4/5 after he got a call that his loop recorder was showing afib and to go to the hospital.  Work up including atypical atrial flutter, prolonged QTC and ABLA.  PMHx:  stroke, seizures, HTN, DM2   Clinical Impression   Pt admitted to ED for concerns listed above. PTA pt was independent in all ADL's and IADL's. Pt reported that he was working on moving into an apartment on his own when his doctor told him to come to the ED. At the time of the evaluation, pt continued to demonstrate independence, completing self care and functional mobility with no DME, independently. Pt does not need skilled OT services at this time and will be discharged from acute OT.     Follow Up Recommendations  No OT follow up    Equipment Recommendations       Recommendations for Other Services       Precautions / Restrictions Precautions Precautions: None Restrictions Weight Bearing Restrictions: No      Mobility Bed Mobility Overal bed mobility: Independent                  Transfers Overall transfer level: Independent Equipment used: None                  Balance Overall balance assessment: Modified Independent                                         ADL either performed or assessed with clinical judgement   ADL Overall ADL's : Independent                                       General ADL Comments: Pt completed grooming standing at the sink, a toilet transfer w/ no DME, and LE dressing sitting EOB.     Vision Baseline Vision/History: Wears glasses Wears Glasses: Reading only Patient Visual Report: No change from baseline Vision Assessment?: No apparent visual deficits     Perception Perception Perception Tested?: No   Praxis Praxis Praxis tested?: Not  tested    Pertinent Vitals/Pain Pain Assessment: No/denies pain     Hand Dominance Right   Extremity/Trunk Assessment Upper Extremity Assessment Upper Extremity Assessment: Overall WFL for tasks assessed   Lower Extremity Assessment Lower Extremity Assessment: Defer to PT evaluation   Cervical / Trunk Assessment Cervical / Trunk Assessment: Normal   Communication Communication Communication: No difficulties   Cognition Arousal/Alertness: Awake/alert Behavior During Therapy: WFL for tasks assessed/performed Overall Cognitive Status: Within Functional Limits for tasks assessed                                     General Comments  VSS on RA    Exercises     Shoulder Instructions      Home Living Family/patient expects to be discharged to:: Private residence Living Arrangements: Other relatives (Brother) Available Help at Discharge: Family;Available PRN/intermittently Type of Home: House Home Access: Stairs to enter [employer] of Steps: 1 flight Entrance Stairs-Rails: Right;Left Home Layout: One level  Bathroom Shower/Tub: Tub/shower unit;Walk-in shower   Bathroom Toilet: Standard Bathroom Accessibility: Yes How Accessible: Accessible via walker Home Equipment: None          Prior Functioning/Environment Level of Independence: Independent        Comments: does all adl's/iadl's per pt except drive due to seizure        OT Problem List: Decreased strength;Decreased activity tolerance;Decreased safety awareness      OT Treatment/Interventions:      OT Goals(Current goals can be found in the care plan section) Acute Rehab OT Goals Patient Stated Goal: Go home and get some good sleep. OT Goal Formulation: With patient  OT Frequency:     Barriers to D/C:            Co-evaluation              AM-PAC OT "6 Clicks" Daily Activity     Outcome Measure Help from another person eating meals?: None Help from another  person taking care of personal grooming?: None Help from another person toileting, which includes using toliet, bedpan, or urinal?: None Help from another person bathing (including washing, rinsing, drying)?: None Help from another person to put on and taking off regular upper body clothing?: None Help from another person to put on and taking off regular lower body clothing?: None 6 Click Score: 24   End of Session Equipment Utilized During Treatment: Gait belt Nurse Communication: Mobility status  Activity Tolerance: Patient tolerated treatment well Patient left: in bed;with call bell/phone within reach  OT Visit Diagnosis: Unsteadiness on feet (R26.81);Muscle weakness (generalized) (M62.81)                Time: 5409-8119 OT Time Calculation (min): 19 min Charges:  OT General Charges $OT Visit: 1 Visit OT Evaluation $OT Eval Low Complexity: [place]., OTR/L Acute Rehabilitation  Syre Knerr Elane Elleanna Melling 11/22/2020, 11:23 AM

## 2020-11-22 NOTE — Progress Notes (Addendum)
Inpatient Diabetes Program Recommendations  AACE/ADA: New Consensus Statement on Inpatient Glycemic Control (2015)  Target Ranges:  Prepandial:   less than 140 mg/dL      Peak postprandial:   less than 180 mg/dL (1-2 hours)      Critically ill patients:  140 - 180 mg/dL   Lab Results  Component Value Date   GLUCAP 150 (H) 11/22/2020   HGBA1C 9.6 (H) 11/21/2020    Review of Glycemic Control Results for Phillip Oconnor, Phillip Oconnor (MRN [id number]) as of 11/22/2020 10:26  Ref. Range 11/21/2020 08:32 11/21/2020 11:52 11/21/2020 17:27 11/21/2020 22:12 11/22/2020 08:10  Glucose-Capillary Latest Ref Range: 70 - 99 mg/dL 195 (H) 180 (H) 232 (H) 303 (H) 150 (H)   Diabetes history: DM 2 Outpatient Diabetes medications: metformin 500 mg bid, Tradjenta 5 mg Daily Current orders for Inpatient glycemic control:  Novolog 0-15 units tid  A1c elevated at 9.6% this admission  Inpatient Diabetes Program Recommendations:    Note: second dose of Lantus this am.   -  Pt may benefit from Novolog 3 units tid meal coverage to prevent postprandial spikes.  Thanks,  Tama Headings RN, MSN, BC-ADM Inpatient Diabetes Coordinator Team Pager [phone] (8a-5p)

## 2020-11-22 NOTE — Plan of Care (Signed)
  Problem: Clinical Measurements: Goal: Respiratory complications will improve Outcome: Progressing   Problem: Activity: Goal: Risk for activity intolerance will decrease Outcome: Progressing   Problem: Nutrition: Goal: Adequate nutrition will be maintained Outcome: Progressing   

## 2020-11-22 NOTE — Progress Notes (Signed)
PROGRESS NOTE  Phillip Oconnor MWN:[id number] DOB: [birth year] DOA: 11/19/2020 PCP: Jenel Lucks, PA-C  HPI/Recap of past 24 hours: Phillip Oconnor a 80 y.o.malewith medical history significant ofDM2, HTN, HLD. Presented to [facility] ED after receiving a call that his loop recorder was showing A. fib and that he needed to come to the hospital.  He drove himself to [facility].  He had recently been treated for urinary tract infection, completed his 10-day course of oral antibiotics on Saturday, 11/17/2020, was otherwise in his usual state of health.  Upon presentation to the ED, twelve-lead EKG showing a flutter, BNP elevated 800, small bilateral pleural effusions on chest x-ray.    Was started on heparin drip and received 1 dose of IV Lasix 40 mg.  Cardiology was consulted by EDP.  Hospitalist team asked to admit.  Hospital course complicated by acute blood loss anemia of unclear etiology.  Ongoing anemia work-up.  11/22/20:  [name]  He denies have any abdominal pain or nausea.  Denies any melena, hematochezia, or cardiopulmonary symptoms.  Drop in hemoglobin this morning.  FOBT was negative, no overt bleeding, no evidence of iron deficiency.  Vitamin Q03 and folic acid levels are borderline.  Started on folic acid and vitamin B12 replacements.  Curb sided with GI, Dr. Rayne Du, this is less likely GI related anemia, suspect anemia of chronic disease.  GI will follow the patient in the clinic.   Assessment/Plan: Principal Problem:   Atrial flutter, paroxysmal (HCC) Active Problems:   DM2 (diabetes mellitus, type 2) (HCC)   HTN (hypertension)   History of multiple strokes   Seizure (HCC)  Atypical atrial flutter CHA2DS2-VASc of 6 Currently rate controlled on Toprol-XL 50 mg daily Repeat twelve-lead EKG done on 11/22/2020 revealed a flutter rate of 67 and QTC of 515 Plan to DC heparin drip on 11/22/2020 and switching to Eliquis Appreciate  cardiology's assistance. 2D echo done on 11/20/2020 showed LVEF 70 to 75%, no regional wall motion abnormalities.  Positive bubble study.  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  Anemia of chronic disease Acute drop in hemoglobin FOBT negative on 11/22/2020 Iron studies with no evidence of iron deficiency. K74 and folic acid level borderline Started Q59 and folic acid replacement. Curb sided with GI on 11/22/2020, Dr. Rayne Du.  Per GI, this is less likely GI related anemia, suspect anemia of chronic disease.  GI will follow in the clinic.  Persistent prolonged QTC Admission EKG revealed QTC greater than 500 Repeated twelve-lead EKG done on 11/22/2020 showed QTC 515. Continue to optimize magnesium and potassium levels Continue to avoid QTC prolonging agents Repeat twelve-lead EKG on 11/21/2020  Essential hypertension BP is currently at goal Continue to hold off home losartan and home Procardia Continue to monitor vital signs.  History of seizure disorder Continue home Keppra Seizure precautions  Hyperlipidemia Continue home Lipitor  BPH Continue home Cardura Monitor urine output  History of CVA Continue Lipitor 80 mg daily Continue Eliquis for secondary CVA prevention PT OT assessed with no further recommendations.    Code Status: Full code.  Family Communication: None at bedside.  Disposition Plan: Likely discharge to home on 11/23/2020 if hemoglobin is stable or when cardiology signs of.   Consultants:  Cardiology.  Procedures:  2D echo.  Antimicrobials:  None.  DVT prophylaxis: Eliquis  Status is: Inpatient    Dispo: The patient is from: Home.  Anticipated d/c is to: Home on 11/23/2020 if hemoglobin is stable or when cardiology signs off.               Patient currently not stable for discharge, ongoing management of anemia and atypical a flutter.   Difficult to place patient:  Not applicable.        Objective: Vitals:   11/21/20 2010 11/22/20 0053 11/22/20 0615 11/22/20 0900  BP: 137/64 (!) 141/74 (!) 148/80 (!) 159/68  Pulse: 67 62 (!) 56 67  Resp: 18  20   Temp: 97.6 F (36.4 C)  97.8 F (36.6 C)   TempSrc: Oral  Oral   SpO2: 98% 94% 95%   Weight:   94.2 kg   Height:        Intake/Output Summary (Last 24 hours) at 11/22/2020 1145 Last data filed at 11/22/2020 1000 Gross per 24 hour  Intake 879.2 ml  Output 570 ml  Net 309.2 ml   Filed Weights   11/19/20 1505 11/20/20 0251 11/22/20 0615  Weight: 98.3 kg 93.3 kg 94.2 kg    Exam:  . General: 80 y.o. year-old male well-developed well-nourished in no acute distress.  He is alert oriented x3.   . Cardiovascular: Regular rate and rhythm no rubs or gallops.  Marland Kitchen Respiratory: Clear to auscultation no wheezes or rales. . Abdomen: Soft nontender normal bowel sounds present.  Musculoskeletal: No lower extremity edema bilaterally.   . Skin: No ulcerative lesions noted. Marland Kitchen Psychiatry: Mood is appropriate for condition and setting.   Data Reviewed: CBC: Recent Labs  Lab 11/19/20 1652 11/20/20 0817 11/20/20 1740 11/21/20 0340 11/21/20 1657 11/22/20 0338 11/22/20 0746  WBC 19.3* 17.2*  --  11.2* 8.6 7.8  --   NEUTROABS  --  14.3*  --   --   --   --   --   HGB 11.6* 9.6* 9.8* 9.0* 9.3* 8.5* 8.9*  HCT 33.7* 28.4* 28.4* 26.4* 27.5* 24.8* 26.4*  MCV 92.8 92.5  --  92.6 92.9 93.6  --   PLT 386 349  --  365 388 354  --    Basic Metabolic Panel: Recent Labs  Lab 11/19/20 1652 11/20/20 0817 11/21/20 0340 11/21/20 1610 11/22/20 0338  NA 134* 135 133* 134* 135  K 3.6 3.4* 3.2* 3.9 3.8  CL 100 100 100 103 105  CO2 22 25 24 25 25   GLUCOSE 280* 250* 222* 216* 210*  BUN 19 13 16 16 15   CREATININE 1.53* 1.42* 1.60* 1.47* 1.43*  CALCIUM 9.5 8.9 8.4* 8.3* 8.2*  MG  --  1.4*  --  1.8  --    GFR: Estimated Creatinine Clearance: 49.7 mL/min (A) (by C-G formula based on SCr of 1.43 mg/dL (H)). Liver  Function Tests: Recent Labs  Lab 11/19/20 1650 11/20/20 0817  AST 30 20  ALT 29 21  ALKPHOS 132* 100  BILITOT 1.0 1.0  PROT 7.3 5.6*  ALBUMIN 3.4* 2.6*   Recent Labs  Lab 11/19/20 1650  LIPASE 36   No results for input(s): AMMONIA in the last 168 hours. Coagulation Profile: Recent Labs  Lab 11/19/20 1652  INR 1.1   Cardiac Enzymes: No results for input(s): CKTOTAL, CKMB, CKMBINDEX, TROPONINI in the last 168 hours. BNP (last 3 results) No results for input(s): PROBNP in the last 8760 hours. HbA1C: Recent Labs    11/20/20 0301 11/21/20 0340  HGBA1C 9.3* 9.6*   CBG: Recent Labs  Lab 11/21/20 0832 11/21/20 1152 11/21/20 1727 11/21/20 2212 11/22/20  0810  GLUCAP 195* 180* 232* 303* 150*   Lipid Profile: No results for input(s): CHOL, HDL, LDLCALC, TRIG, CHOLHDL, LDLDIRECT in the last 72 hours. Thyroid Function Tests: Recent Labs    11/19/20 1652  TSH 2.299   Anemia Panel: Recent Labs    11/21/20 1635  VITAMINB12 246  FOLATE 6.6  FERRITIN 181  TIBC 244*  IRON 46  RETICCTPCT 2.0   Urine analysis:    Component Value Date/Time   COLORURINE YELLOW 11/19/2020 1816   APPEARANCEUR CLEAR 11/19/2020 1816   LABSPEC 1.020 11/19/2020 1816   PHURINE 5.5 11/19/2020 1816   GLUCOSEU >=500 (A) 11/19/2020 1816   HGBUR NEGATIVE 11/19/2020 1816   BILIRUBINUR NEGATIVE 11/19/2020 1816   KETONESUR NEGATIVE 11/19/2020 1816   PROTEINUR NEGATIVE 11/19/2020 1816   NITRITE NEGATIVE 11/19/2020 1816   LEUKOCYTESUR NEGATIVE 11/19/2020 1816   Sepsis Labs: @LABRCNTIP (procalcitonin:4,lacticidven:4)  ) Recent Results (from the past 240 hour(s))  Resp Panel by RT-PCR (Flu A&B, Covid) Nasopharyngeal Swab     Status: None   Collection Time: 11/19/20  6:13 PM   Specimen: Nasopharyngeal Swab; Nasopharyngeal(NP) swabs in vial transport medium  Result Value Ref Range Status   SARS Coronavirus 2 by RT PCR NEGATIVE NEGATIVE Final    Comment: (NOTE) SARS-CoV-2 target nucleic  acids are NOT DETECTED.  The SARS-CoV-2 RNA is generally detectable in upper respiratory specimens during the acute phase of infection. The lowest concentration of SARS-CoV-2 viral copies this assay can detect is 138 copies/mL. A negative result does not preclude SARS-Cov-2 infection and should not be used as the sole basis for treatment or other patient management decisions. A negative result may occur with  improper specimen collection/handling, submission of specimen other than nasopharyngeal swab, presence of viral mutation(s) within the areas targeted by this assay, and inadequate number of viral copies(<138 copies/mL). A negative result must be combined with clinical observations, patient history, and epidemiological information. The expected result is Negative.  Fact Sheet for Patients:  EntrepreneurPulse.com.au  Fact Sheet for Healthcare Providers:  IncredibleEmployment.be  This test is no t yet approved or cleared by the [place] FDA and  has been authorized for detection and/or diagnosis of SARS-CoV-2 by FDA under an Emergency Use Authorization (EUA). This EUA will remain  in effect (meaning this test can be used) for the duration of the COVID-19 declaration under Section 564(b)(1) of the Act, 21 U.S.C.section 360bbb-3(b)(1), unless the authorization is terminated  or revoked sooner.       Influenza A by PCR NEGATIVE NEGATIVE Final   Influenza B by PCR NEGATIVE NEGATIVE Final    Comment: (NOTE) The Xpert Xpress SARS-CoV-2/FLU/RSV plus assay is intended as an aid in the diagnosis of influenza from Nasopharyngeal swab specimens and should not be used as a sole basis for treatment. Nasal washings and aspirates are unacceptable for Xpert Xpress SARS-CoV-2/FLU/RSV testing.  Fact Sheet for Patients: EntrepreneurPulse.com.au  Fact Sheet for Healthcare Providers: IncredibleEmployment.be  This  test is not yet approved or cleared by the [place] FDA and has been authorized for detection and/or diagnosis of SARS-CoV-2 by FDA under an Emergency Use Authorization (EUA). This EUA will remain in effect (meaning this test can be used) for the duration of the COVID-19 declaration under Section 564(b)(1) of the Act, 21 U.S.C. section 360bbb-3(b)(1), unless the authorization is terminated or revoked.  Performed at [facility], [address] [place], [place] [address]   Culture, Urine     Status: Abnormal   Collection  Time: 11/20/20 12:00 PM   Specimen: Urine, Random  Result Value Ref Range Status   Specimen Description URINE, RANDOM  Final   Special Requests NONE  Final   Culture (A)  Final    <10,000 COLONIES/mL INSIGNIFICANT GROWTH Performed at [facility], [address] [place], [place] [address]    Report Status 11/21/2020 FINAL  Final      Studies: No results found.  Scheduled Meds: . atorvastatin  80 mg Oral Daily  . doxazosin  4 mg Oral Daily  . folic acid  1 mg Oral Daily  . insulin aspart  0-15 Units Subcutaneous TID WC  . insulin glargine  5 Units Subcutaneous BID  . levETIRAcetam  500 mg Oral BID  . metoprolol succinate  50 mg Oral Daily  . nitroGLYCERIN  1 inch Topical Q6H  . senna-docusate  2 tablet Oral Daily    Continuous Infusions: . heparin 1,550 Units/hr (11/22/20 0456)     LOS: 2 days     Kayleen Memos, MD [facility] Pager [phone]  If 7PM-7AM, please contact night-coverage [email or web address] Password [facility] 11/22/2020, 11:45 AM

## 2020-11-22 NOTE — Progress Notes (Signed)
Progress Note  Patient Name: Phillip Oconnor Date of Encounter: 11/22/2020  Attending physician: Kayleen Memos, DO Primary care provider: Jenel Lucks, PA-C Primary Cardiologist:  Consultant:[place] Coldren Terri Skains, DO  Subjective: Phillip Oconnor is a 80 y.o. male who was seen and examined at bedside at approximately 0830am No events overnight. Telemetry reviewed remains in atrial flutter with rate control. Patient denies any chest pain at rest or with effort related activities, no shortness of breath at rest or with effort related activities, no orthopnea, paroxysmal nocturnal dyspnea or lower extremity swelling. Case discussed and reviewed with his nurse.  Objective: Vital Signs in the last 24 hours: Temp:  [97.6 F (36.4 C)-98.1 F (36.7 C)] 97.8 F (36.6 C) (04/08 0615) Pulse Rate:  [56-82] 56 (04/08 0615) Resp:  [17-20] 20 (04/08 0615) BP: (121-154)/(61-80) 148/80 (04/08 0615) SpO2:  [94 %-98 %] 95 % (04/08 0615) Weight:  [94.2 kg] 94.2 kg (04/08 0615)  Intake/Output:  Intake/Output Summary (Last 24 hours) at 11/22/2020 0848 Last data filed at 11/22/2020 0616 Gross per 24 hour  Intake 879.2 ml  Output 570 ml  Net 309.2 ml    Net IO Since Admission: -1,871.47 mL [11/22/20 0848]  Weights:  Filed Weights   11/19/20 1505 11/20/20 0251 11/22/20 0615  Weight: 98.3 kg 93.3 kg 94.2 kg    Telemetry: Personally reviewed.  Atrial flutter, rate controlled  Physical examination: PHYSICAL EXAM: Vitals with BMI 11/22/2020 11/22/2020 11/21/2020  Height - - -  Weight 207 lbs 11 oz - -  BMI 56.43 - -  Systolic 329 518 841  Diastolic 80 74 64  Pulse 56 62 67    CONSTITUTIONAL: Well-developed and well-nourished. No acute distress.  SKIN: Skin is warm and dry. No rash noted. No cyanosis. No pallor. No jaundice HEAD: Normocephalic and atraumatic.  EYES: No scleral icterus MOUTH/THROAT: Moist oral membranes.  NECK: No JVD present. No thyromegaly noted. No carotid bruits   LYMPHATIC: No visible cervical adenopathy.  CHEST Normal respiratory effort. No intercostal retractions. Loop recorder site is C/D/I LUNGS:Clear to auscultation bilaterally.No stridor. No wheezes. No rales.  CARDIOVASCULAR:Irregularly irregular, variable S1-S2, no murmurs rubs or gallops appreciated. ABDOMINAL:Soft, nontender, nondistended, positive bowel sounds in all 4 quadrants, no apparent ascites.  EXTREMITIES:Trace bilateralperipheral edemacompression stockings present. HEMATOLOGIC: No significant bruising NEUROLOGIC: Oriented to person, place, and time. Nonfocal. Normal muscle tone.  PSYCHIATRIC: Normal mood and affect. Normal behavior. Cooperative.  Lab Results: Hematology Recent Labs  Lab 11/21/20 0340 11/21/20 1635 11/21/20 1657 11/22/20 0338 11/22/20 0746  WBC 11.2*  --  8.6 7.8  --   RBC 2.85* 2.97* 2.96* 2.65*  --   HGB 9.0*  --  9.3* 8.5* 8.9*  HCT 26.4*  --  27.5* 24.8* 26.4*  MCV 92.6  --  92.9 93.6  --   MCH 31.6  --  31.4 32.1  --   MCHC 34.1  --  33.8 34.3  --   RDW 13.1  --  13.2 13.2  --   PLT 365  --  388 354  --     Chemistry Recent Labs  Lab 11/19/20 1650 11/19/20 1652 11/20/20 0817 11/21/20 0340 11/21/20 1610 11/22/20 0338  NA  --    < > 135 133* 134* 135  K  --    < > 3.4* 3.2* 3.9 3.8  CL  --    < > 100 100 103 105  CO2  --    < > 25 24 25 25   GLUCOSE  --    < >  250* 222* 216* 210*  BUN  --    < > 13 16 16 15   CREATININE  --    < > 1.42* 1.60* 1.47* 1.43*  CALCIUM  --    < > 8.9 8.4* 8.3* 8.2*  PROT 7.3  --  5.6*  --   --   --   ALBUMIN 3.4*  --  2.6*  --   --   --   AST 30  --  20  --   --   --   ALT 29  --  21  --   --   --   ALKPHOS 132*  --  100  --   --   --   BILITOT 1.0  --  1.0  --   --   --   GFRNONAA  --    < > 50* 43* 48* 49*  ANIONGAP  --    < > 10 9 6 5    < > = values in this interval not displayed.     Cardiac Enzymes: Cardiac Panel (last 3 results) Recent Labs    11/19/20 1652 11/19/20 1930  TROPONINIHS  46* 48*    BNP (last 3 results) Recent Labs    11/19/20 1650  BNP 897.2*    ProBNP (last 3 results) No results for input(s): PROBNP in the last 8760 hours.   DDimer No results for input(s): DDIMER in the last 168 hours.   Hemoglobin A1c:  Lab Results  Component Value Date   HGBA1C 9.6 (H) 11/21/2020   MPG 228.82 11/21/2020    TSH  Recent Labs    11/19/20 1652  TSH 2.299    Lipid Panel No results found for: CHOL, TRIG, HDL, CHOLHDL, VLDL, LDLCALC, LDLDIRECT  Imaging: ECHOCARDIOGRAM COMPLETE  Result Date: 11/20/2020    ECHOCARDIOGRAM REPORT   Patient Name:   Phillip Oconnor Date of Exam: 11/20/2020 Medical Rec #:  [id number]      Height:       76.0 in Accession #:    [id number]     Weight:       205.7 lb Date of Birth:  1941/05/10      BSA:          2.242 m Patient Age:    18 years       BP:           140/56 mmHg Patient Gender: M              HR:           66 bpm. Exam Location:  Inpatient Procedure: 2D Echo, Cardiac Doppler and Color Doppler Indications:    Atrial fibrillation  History:        Patient has no prior history of Echocardiogram examinations.                 Stroke; Risk Factors:Hypertension, Diabetes and hyperlipidemia.  Sonographer:    Luisa Hart RDCS Referring Phys: 63 JARED M GARDNER  Sonographer Comments: Technically difficult study due to poor echo windows. IMPRESSIONS  1. Left ventricular ejection fraction, by estimation, is 70 to 75%. The left ventricle has hyperdynamic function. The left ventricle has no regional wall motion abnormalities. There is mild left ventricular hypertrophy. Left ventricular diastolic parameters are indeterminate.  2. Right ventricular systolic function is normal. The right ventricular size is normal. There is mildly elevated pulmonary artery systolic pressure. The estimated right ventricular systolic pressure is 63.8 mmHg.  3. The mitral valve  is grossly normal. Trivial mitral valve regurgitation.  4. The aortic valve is tricuspid.  Aortic valve regurgitation is not visualized. Mild aortic valve sclerosis is present, with no evidence of aortic valve stenosis.  5. Evidence of atrial level shunting detected by color flow Doppler. Agitated saline contrast bubble study was positive with shunting observed within 3-6 cardiac cycles suggestive of interatrial shunt. 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 Comparison(s): No prior Echocardiogram. FINDINGS  Left Ventricle: Left ventricular ejection fraction, by estimation, is 70 to 75%. The left ventricle has hyperdynamic function. The left ventricle has no regional wall motion abnormalities. The left ventricular internal cavity size was normal in size. There is mild left ventricular hypertrophy. Left ventricular diastolic function could not be evaluated due to atrial fibrillation. Left ventricular diastolic parameters are indeterminate. Right Ventricle: The right ventricular size is normal. No increase in right ventricular wall thickness. Right ventricular systolic function is normal. There is mildly elevated pulmonary artery systolic pressure. The tricuspid regurgitant velocity is 2.99  m/s, and with an assumed right atrial pressure of 3 mmHg, the estimated right ventricular systolic pressure is 06.2 mmHg. Left Atrium: Left atrial size was normal in size. Right Atrium: Right atrial size was normal in size. Pericardium: There is no evidence of pericardial effusion. Mitral Valve: The mitral valve is grossly normal. Trivial mitral valve regurgitation. Tricuspid Valve: The tricuspid valve is grossly normal. Tricuspid valve regurgitation is trivial. Aortic Valve: The aortic valve is tricuspid. Aortic valve regurgitation is not visualized. Mild aortic valve sclerosis is present, with no evidence of aortic valve stenosis. Aortic valve mean gradient measures 3.0 mmHg. Aortic valve peak gradient measures 5.8 mmHg. Aortic valve area, by VTI  measures 2.49 cm. Pulmonic Valve: The pulmonic valve was grossly normal. Pulmonic valve regurgitation is trivial. Aorta: The aortic root and ascending aorta are structurally normal, with no evidence of dilitation. IAS/Shunts: The interatrial septum is aneurysmal. Evidence of atrial level shunting detected by color flow Doppler. Agitated saline contrast bubble study was positive with shunting observed within 3-6 cardiac cycles suggestive of interatrial shunt. A small patent foramen ovale is detected with predominantly left to right shunting across the atrial septum.  LEFT VENTRICLE PLAX 2D LVIDd:         4.90 cm     Diastology LVIDs:         3.00 cm     LV e' medial:    9.37 cm/s LV PW:         1.10 cm     LV E/e' medial:  9.7 LV IVS:        1.40 cm     LV e' lateral:   12.60 cm/s LVOT diam:     2.10 cm     LV E/e' lateral: 7.2 LV SV:         63 LV SV Index:   28 LVOT Area:     3.46 cm  LV Volumes (MOD) LV vol d, MOD A2C: 68.6 ml LV vol d, MOD A4C: 56.8 ml LV vol s, MOD A2C: 19.1 ml LV vol s, MOD A4C: 13.3 ml LV SV MOD A2C:     49.5 ml LV SV MOD A4C:     56.8 ml LV SV MOD BP:      47.0 ml RIGHT VENTRICLE RV S prime:     18.50 cm/s TAPSE (M-mode): 2.4 cm LEFT ATRIUM  Index LA diam:      3.60 cm 1.61 cm/m LA Vol (A4C): 37.1 ml 16.55 ml/m  AORTIC VALVE                   PULMONIC VALVE AV Area (Vmax):    2.56 cm    PV Vmax:       1.56 m/s AV Area (Vmean):   2.35 cm    PV Vmean:      101.000 cm/s AV Area (VTI):     2.49 cm    PV VTI:        0.271 m AV Vmax:           120.00 cm/s PV Peak grad:  9.7 mmHg AV Vmean:          85.900 cm/s PV Mean grad:  5.0 mmHg AV VTI:            0.253 m AV Peak Grad:      5.8 mmHg AV Mean Grad:      3.0 mmHg LVOT Vmax:         88.60 cm/s LVOT Vmean:        58.200 cm/s LVOT VTI:          0.182 m LVOT/AV VTI ratio: 0.72  AORTA Ao Root diam: 3.60 cm Ao Asc diam:  3.90 cm MITRAL VALVE               TRICUSPID VALVE MV Area (PHT): 4.21 cm    TR Peak grad:   35.8 mmHg MV Decel Time:  180 msec    TR Vmax:        299.00 cm/s MV E velocity: 91.30 cm/s MV A velocity: 46.00 cm/s  SHUNTS MV E/A ratio:  1.98        Systemic VTI:  0.18 m                            Systemic Diam: 2.10 cm Lyman Bishop MD Electronically signed by Lyman Bishop MD Signature Date/Time: 11/20/2020/3:36:07 PM    Final     Cardiac database: EKG: 11/19/2020: Atypical atrial flutter with variable conduction, 65 bpm, underlying right bundle branch block, 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  Echocardiogram: 11/20/2020: 1. Left ventricular ejection fraction, by estimation, is 70 to 75%. The  left ventricle has hyperdynamic function. The left ventricle has no  regional wall motion abnormalities. There is mild left ventricular  hypertrophy. Left ventricular diastolic  parameters are indeterminate.  2. Right ventricular systolic function is normal. The right ventricular  size is normal. There is mildly elevated pulmonary artery systolic  pressure. The estimated right ventricular systolic pressure is 32.4 mmHg.  3. The mitral valve is grossly normal. Trivial mitral valve  regurgitation.  4. The aortic valve is tricuspid. Aortic valve regurgitation is not  visualized. Mild aortic valve sclerosis is present, with no evidence of  aortic valve stenosis.  5. Evidence of atrial level shunting detected by color flow Doppler.  Agitated saline contrast bubble study was positive with shunting observed  within 3-6 cardiac cycles suggestive of interatrial shunt. There is a  small patent foramen ovale with  predominantly left to right shunting across the atrial septum.  Scheduled Meds: . atorvastatin  80 mg Oral Daily  . doxazosin  4 mg Oral Daily  . folic acid  1 mg Oral Daily  . insulin aspart  0-15 Units Subcutaneous TID WC  .  insulin glargine  5 Units Subcutaneous BID  . levETIRAcetam  500 mg Oral BID  . metoprolol succinate  50 mg Oral Daily  . nitroGLYCERIN  1 inch Topical  Q6H  . senna-docusate  2 tablet Oral Daily    Continuous Infusions: . heparin 1,550 Units/hr (11/22/20 0456)    PRN Meds: acetaminophen, hydrALAZINE, ondansetron (ZOFRAN) IV   IMPRESSION & RECOMMENDATIONS: Phillip Oconnor is a 80 y.o. male whose past medical history and cardiac risk factors include: Hypertension, hyperlipidemia, diabetes mellitus type 2, history of stroke (per imaging CT head without contrast 11/19/2020), atrial flutter.  Impression & Plan: Atypical atrial flutter: Rate controlled, now on Toprol-XL. Rhythm control: N/A Thromboembolic prophylaxis: Currently on IV heparin drip. Continue IV heparin until his work-up of anemia is complete.  Shared decision was to transition to Eliquis. Continue telemetry TSH within normal limits Telemetry reviewed  Long-term oral anticoagulation: Indication: Atrial flutter. Discussed the risks, benefits and alternatives to oral anticoagulation.  Patient is agreeable to transitioning to oral anticoagulation given his CHA2DS2-VASc score.  CHA2DS2-VASc SCORE is6which correlates to 9.7% risk of stroke per year (Age, HTN, DM, hx of stroke). Reemphasize the risks, benefits, and alternatives to anticoagulation.  Patient verbalizes understanding.  His questions concerns addressed. HB trends: 11.6 > 9.6> 9.8> 9.0  > 8.5> 8.9 today.  FOBT negative Consider GI evaluation prior to transitioning to oral anticoagulation.  Once cleared me transition to Eliquis.  Patient is aware of medication profile and verbalized understanding with regards to risks, benefits, and alternatives to oral anticoagulation.  LE swelling: Improving Clinically patient does not appear to be in congestive heart failure. No JVP on examination or vascular congestion on chest x-ray. Echocardiogram notes preserved LVEF without any significant valvular heart disease. ElevatedBNP may be secondary to his underlying atrial flutter. Lower extremity swelling may be secondary  to chronic venous insufficiency.  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  Small PFO: Noted on a bubble study. Monitor for now.  Benign essential hypertension:Per primary team.  Hyperlipidemia: continue statin therapy. Fasting lipids in am.  Non-insulin-dependent diabetes mellitus type 2:per primary team.   History of stroke(perimaging,but patient denies such history): d/c ASA 325mg  po qday. Will hold ASA 81mg  po qday due to initiation of AC and down treading Hb. Continue statin. Secondary prevention recommended.  Former smoker. Educated on the importance of continued smoking cessation.  Patient's ventricular rate is well controlled.  Once cleared by GI continue transition to oral anticoagulation.  We will follow the patient peripherally.  Plan discussed with attending physician over the phone as well.  Also reviewed the physical chart no prior records from [facility] received.  Patient is requested to follow-up at the clinic 4 weeks.  Patient's questions and concerns were addressed to his satisfaction. He voices understanding of the instructions provided during this encounter.   This note was created using a voice recognition software as a result there may be grammatical errors inadvertently enclosed that do not reflect the nature of this encounter. Every attempt is made to correct such errors.  Mechele Claude [facility]  Pager: [phone] Office: [phone] 11/22/2020, 8:48 AM

## 2020-11-22 NOTE — Progress Notes (Signed)
[place] for Heparin Indication: atrial fibrillation  Allergies  Allergen Reactions  . Penicillins Rash and Swelling    Lips swell  . Plavix [Clopidogrel] Palpitations    Patient Measurements: Height: 6\' 4"  (193 cm) Weight: 94.2 kg (207 lb 11.2 oz) IBW/kg (Calculated) : 86.8 Heparin Dosing Weight:    Vital Signs: Temp: 97.8 F (36.6 C) (04/08 0615) Temp Source: Oral (04/08 0615) BP: 159/68 (04/08 0900) Pulse Rate: 67 (04/08 0900)  Labs: Recent Labs    11/19/20 1652 11/19/20 1930 11/20/20 0817 11/21/20 0340 11/21/20 1610 11/21/20 1657 11/22/20 0338 11/22/20 0746  HGB 11.6*  --    < > 9.0*  --  9.3* 8.5* 8.9*  HCT 33.7*  --    < > 26.4*  --  27.5* 24.8* 26.4*  PLT 386  --    < > 365  --  388 354  --   LABPROT 14.0  --   --   --   --   --   --   --   INR 1.1  --   --   --   --   --   --   --   HEPARINUNFRC  --   --    < > <0.10* 0.59  --  0.83*  --   CREATININE 1.53*  --    < > 1.60* 1.47*  --  1.43*  --   TROPONINIHS 46* 48*  --   --   --   --   --   --    < > = values in this interval not displayed.    Estimated Creatinine Clearance: 49.7 mL/min (A) (by C-G formula based on SCr of 1.43 mg/dL (H)).  Assessment: 80 y.o. male with Afib on heparin. Pharmacy consulted to transition to apixaban -SCr= 1.43 (admit was 1.53) -hg= 8.9  Goal of Therapy:  Heparin level 0.3-0.7 units/ml Monitor platelets by anticoagulation protocol: Yes   Plan:  -Apixaban 5mg  po bid; may need to change to lower dose depending on SCr trend -BMET in am  Hildred Laser, PharmD Clinical Pharmacist **Pharmacist phone directory can now be found on [email or web address] ([email or web address]).  Listed under [place].

## 2020-11-22 NOTE — Discharge Instructions (Addendum)
Atrial Flutter  Atrial flutter is a type of abnormal heart rhythm (arrhythmia). The heart has an electrical system that tells it how to beat. In atrial flutter, the signals move rapidly in the top chambers of the heart (the atria). This makes your heart beat very fast. Atrial flutter can come and go, or it can be permanent. The goal of treatment is to prevent blood clots from forming, control your heart rate, or restore your heartbeat to a normal rhythm. If this condition is not treated, it can cause serious problems, such as a weakened heart muscle (cardiomyopathy) or a stroke. What are the causes? This condition is often caused by conditions that damage the heart's electrical system. These include:  Heart conditions and heart surgery. These include heart attacks and open-heart surgery.  Lung problems, such as COPD or a blood clot in the lung (pulmonary embolism, or PE).  Poorly controlled high blood pressure (hypertension).  Overactive thyroid (hyperthyroidism).  Diabetes. In some cases, the cause of this condition is not known. What increases the risk? You are more likely to develop this condition if:  You are an elderly adult.  You are a man.  You are overweight (obese).  You have obstructive sleep apnea.  You have a family history of atrial flutter.  You have diabetes.  You drink a lot of alcohol, especially binge drinking.  You use drugs, including cannabis.  You smoke. What are the signs or symptoms? Symptoms of this condition include:  A feeling that your heart is pounding or racing (palpitations).  Shortness of breath.  Chest pain.  Feeling dizzy or light-headed.  Fainting.  Low blood pressure (hypotension).  Fatigue.  Tiring easily during exercise or activity. In some cases, there are no symptoms. How is this diagnosed? This condition may be diagnosed with:  An electrocardiogram (ECG) to check electrical signals of the heart.  An ambulatory  cardiac monitor to record your heart's activity for a few days.  An echocardiogram to create pictures of your heart.  A transesophageal echocardiogram (TEE) to create even better pictures of your heart.  A stress test to check your blood supply while you exercise.  Imaging tests, such as a CT scan or chest X-ray.  Blood tests. How is this treated? Treatment depends on underlying conditions and how you feel when you experience atrial flutter. This condition may be treated with:  Medicines to prevent blood clots or to treat heart rate or heart rhythm problems.  Electrical cardioversion to reset the heart's rhythm.  Ablation to remove the heart tissue that sends abnormal signals.  Left atrial appendage closure to seal the area where blood clots can form. In some cases, underlying conditions will be treated. Follow these instructions at home: Medicines  Take over-the-counter and prescription medicines only as told by your health care provider.  Do not take any new medicines without talking to your health care provider.  If you are taking blood thinners: ? Talk with your health care provider before you take any medicines that contain aspirin or NSAIDs, such as ibuprofen. These medicines increase your risk for dangerous bleeding. ? Take your medicine exactly as told, at the same time every day. ? Avoid activities that could cause injury or bruising, and follow instructions about how to prevent falls. ? Wear a medical alert bracelet or carry a card that lists what medicines you take. Lifestyle  Eat heart-healthy foods. Talk with a dietitian to make an eating plan that is right for you.  Do  not use any products that contain nicotine or tobacco, such as cigarettes, e-cigarettes, and chewing tobacco. If you need help quitting, ask your health care provider.  Do not drink alcohol.  Do not use drugs, including cannabis.  Lose weight if you are overweight or obese.  Exercise  regularly as instructed by your health care provider. General instructions  Do not use diet pills unless your health care provider approves. Diet pills may make heart problems worse.  If you have obstructive sleep apnea, manage your condition as told by your health care provider.  Keep all follow-up visits as told by your health care provider. This is important. Contact a health care provider if you:  Notice a change in the rate, rhythm, or strength of your heartbeat.  Are taking a blood thinner and you notice more bruising.  Have a sudden change in weight.  Tire more easily when you exercise or do heavy work. Get help right away if you have:  Pain or pressure in your chest.  Shortness of breath.  Fainting.  Increasing sweating with no known cause.  Side effects of blood thinners, such as blood in your vomit, stool, or urine, or bleeding that cannot stop.  Any symptoms of a stroke. "BE FAST" is an easy way to remember the main warning signs of a stroke: ? B - Balance. Signs are dizziness, sudden trouble walking, or loss of balance. ? E - Eyes. Signs are trouble seeing or a sudden change in vision. ? F - Face. Signs are sudden weakness or numbness of the face, or the face or eyelid drooping on one side. ? A - Arms. Signs are weakness or numbness in an arm. This happens suddenly and usually on one side of the body. ? S - Speech. Signs are sudden trouble speaking, slurred speech, or trouble understanding what people say. ? T - Time. Time to call emergency services. Write down what time symptoms started.  Other signs of a stroke, such as: ? A sudden, severe headache with no known cause. ? Nausea or vomiting. ? Seizure.  These symptoms may represent a serious problem that is an emergency. Do not wait to see if the symptoms will go away. Get medical help right away. Call your local emergency services (911 in the [place] Do not drive yourself to the hospital. Summary  Atrial  flutter is an abnormal heart rhythm that can give you symptoms of palpitations, shortness of breath, or fatigue.  Atrial flutter is often treated with medicines to keep your heart in a normal rhythm and to prevent a stroke.  Get help right away if you cannot catch your breath, or have chest pain or pressure.  Get help right away if you have signs or symptoms of a stroke. This information is not intended to replace advice given to you by your health care provider. Make sure you discuss any questions you have with your health care provider. Document Revised: 01/25/2019 Document Reviewed: 01/25/2019 Elsevier Patient Education  2021 [place]. Glucose Products:  ReliOnT glucose products raise low blood sugar fast. Tablets are free of fat, caffeine, sodium and gluten. They are portable and easy to carry, making it easier for people with diabetes to BE PREPARED for lows.  Glucose Tablets Available in 6 flavors . 10 ct...................................... $1.00 . 50 ct...................................... $3.98 Glucose Shot..................................$1.48 Glucose Gel....................................$3.44  Alcohol Swabs Alcohol swabs are used to sterilize your injection site. All of our swabs are individually wrapped for maximum safety, convenience and moisture retention.  ReliOnT Alcohol Swabs . 100 ct Swabs..............................$1.00 . 400 ct Swabs..............................$3.74  Lancets ReliOnT offers three lancet options conveniently designed to work with almost every lancing device. Each features a protective disk, which guarantees sterility before testing. ReliOnT Lancets . 100 ct Lancets $1.56 . 200 ct Lancets $2.64 Available in Ultra-Thin, Thin & Micro-Thin ReliOnT 2-IN-1 Lancing Device . 50 ct Lancets..................................... $3.44 Available in 30 gauge and 25 gauge ReliOnT Lancing Device....................$5.84  Blood Glucose  Monitors ReliOnT offers a full range of blood glucose testing options to provide an accurate, affordable system that meets each person's unique needs and preferences. Prime Meter....................................... $9.00 Prime Test Strips . 25 test strips.................................... $5.00 . 50 test strips.................................... $9.00 . 100 test strips.................................$17.88 Premier BLU Meter  ............  $18.98 Premier Voice Meter  .............  $14.98 Premier Test Strips . 50 test strips.................................... $9.00 . 100 test strips.................................$17.88 [employer]  ............  $19.44 Kit includes: . 50 test strips . 10 lancets . Lancing device . Carry case  Ketone Test Strips . 50 test strips  ................  $6.64  Human Insulin  Novolin/ReliOnT (recombinant DNA origin) is manufactured for [employer] by [employer] Insulin* with Vial..........$24.88 Available in N, R, 70/30 Novolin/ReliOnT Insulin Pens*  .....  $42.88 Available in N, R, 70/30  Insulin Delivery ReliOnT syringes and pen needles provide precision technology, comfort and accuracy in insulin delivery at affordable prices. ReliOnT Pen Needles* . 50 ct....................................................$9.00 Available in 33mm, 28mm, 76mm & 53mm ReliOnT Insulin Syringes* . 100 ct ............ $12.58 Available in 29G, 30G & 31G (3/10cc, 1/2cc & 1cc units)  -------------------- Information on my medicine - ELIQUIS (apixaban)  Why was Eliquis prescribed for you? Eliquis was prescribed for you to reduce the risk of a blood clot forming that can cause a stroke if you have a medical condition called atrial fibrillation (a type of irregular heartbeat).  What do You need to know about Eliquis ? Take your Eliquis TWICE DAILY - one tablet in the morning and one tablet in the evening with or without food. If  you have difficulty swallowing the tablet whole please discuss with your pharmacist how to take the medication safely.  Take Eliquis exactly as prescribed by your doctor and DO NOT stop taking Eliquis without talking to the doctor who prescribed the medication.  Stopping may increase your risk of developing a stroke.  Refill your prescription before you run out.  After discharge, you should have regular check-up appointments with your healthcare provider that is prescribing your Eliquis.  In the future your dose may need to be changed if your kidney function or weight changes by a significant amount or as you get older.  What do you do if you miss a dose? If you miss a dose, take it as soon as you remember on the same day and resume taking twice daily.  Do not take more than one dose of ELIQUIS at the same time to make up a missed dose.  Important Safety Information A possible side effect of Eliquis is bleeding. You should call your healthcare provider right away if you experience any of the following: ? Bleeding from an injury or your nose that does not stop. ? Unusual colored urine (red or dark brown) or unusual colored stools (red or black). ? Unusual bruising for unknown reasons. ? A serious fall or if you hit your head (even if there is no bleeding).  Some medicines may interact with Eliquis and might increase your risk of bleeding  or clotting while on Eliquis. To help avoid this, consult your healthcare provider or pharmacist prior to using any new prescription or non-prescription medications, including herbals, vitamins, non-steroidal anti-inflammatory drugs (NSAIDs) and supplements.  This website has more information on Eliquis (apixaban): [email or web address]

## 2020-11-22 NOTE — Progress Notes (Signed)
[facility] for Heparin Indication: atrial fibrillation  Allergies  Allergen Reactions  . Penicillins Rash and Swelling    Lips swell  . Plavix [Clopidogrel] Palpitations    Patient Measurements: Height: 6\' 4"  (193 cm) Weight: 93.3 kg (205 lb 11 oz) IBW/kg (Calculated) : 86.8 Heparin Dosing Weight:    Vital Signs: Temp: 97.6 F (36.4 C) (04/07 2010) Temp Source: Oral (04/07 2010) BP: 141/74 (04/08 0053) Pulse Rate: 62 (04/08 0053)  Labs: Recent Labs    11/19/20 1652 11/19/20 1930 11/20/20 0817 11/21/20 0340 11/21/20 1610 11/21/20 1657 11/22/20 0338  HGB 11.6*  --    < > 9.0*  --  9.3* 8.5*  HCT 33.7*  --    < > 26.4*  --  27.5* 24.8*  PLT 386  --    < > 365  --  388 354  LABPROT 14.0  --   --   --   --   --   --   INR 1.1  --   --   --   --   --   --   HEPARINUNFRC  --   --    < > <0.10* 0.59  --  0.83*  CREATININE 1.53*  --    < > 1.60* 1.47*  --  1.43*  TROPONINIHS 46* 48*  --   --   --   --   --    < > = values in this interval not displayed.    Estimated Creatinine Clearance: 49.7 mL/min (A) (by C-G formula based on SCr of 1.43 mg/dL (H)).  Assessment: 80 y.o. male with Afib for heparin.  Heparin level supratherapeutic  Goal of Therapy:  Heparin level 0.3-0.7 units/ml Monitor platelets by anticoagulation protocol: Yes   Plan:  Decrease Heparin 1550 units/hr  Phillis Knack, PharmD, BCPS

## 2020-11-23 LAB — BASIC METABOLIC PANEL
Anion gap: 6 (ref 5–15)
BUN: 12 mg/dL (ref 8–23)
CO2: 24 mmol/L (ref 22–32)
Calcium: 8.5 mg/dL — ABNORMAL LOW (ref 8.9–10.3)
Chloride: 106 mmol/L (ref 98–111)
Creatinine, Ser: 1.5 mg/dL — ABNORMAL HIGH (ref 0.61–1.24)
GFR, Estimated: 46 mL/min — ABNORMAL LOW (ref >60)
Glucose, Bld: 65 mg/dL — ABNORMAL LOW (ref 70–99)
Potassium: 3.7 mmol/L (ref 3.5–5.1)
Sodium: 136 mmol/L (ref 135–145)

## 2020-11-23 LAB — GLUCOSE, CAPILLARY
Glucose-Capillary: 71 mg/dL (ref 70–99)
Glucose-Capillary: 148 mg/dL — ABNORMAL HIGH (ref 70–99)
Glucose-Capillary: 304 mg/dL — ABNORMAL HIGH (ref 70–99)
Glucose-Capillary: 271 mg/dL — ABNORMAL HIGH (ref 70–99)

## 2020-11-23 LAB — CBC
HCT: 26.3 % — ABNORMAL LOW (ref 39.0–52.0)
Hemoglobin: 8.8 g/dL — ABNORMAL LOW (ref 13.0–17.0)
MCH: 31.2 pg (ref 26.0–34.0)
MCHC: 33.5 g/dL (ref 30.0–36.0)
MCV: 93.3 fL (ref 80.0–100.0)
Platelets: 336 10*3/uL (ref 150–400)
RBC: 2.82 MIL/uL — ABNORMAL LOW (ref 4.22–5.81)
RDW: 13 % (ref 11.5–15.5)
WBC: 6.6 10*3/uL (ref 4.0–10.5)
nRBC: 0 % (ref 0.0–0.2)

## 2020-11-23 MED ORDER — INSULIN GLARGINE 100 UNIT/ML ~~LOC~~ SOLN
5.0000 [IU] | Freq: Every day | SUBCUTANEOUS | Status: DC
  Administered 2020-11-23 – 2020-11-25 (×3): 5 [IU] via SUBCUTANEOUS
  Filled 2020-11-23 (×2): qty 0.05

## 2020-11-23 MED ORDER — NITROGLYCERIN 2 % TD OINT
1.0000 [in_us] | TOPICAL_OINTMENT | Freq: Three times a day (TID) | TRANSDERMAL | Status: DC
  Administered 2020-11-23 – 2020-11-24 (×2): 1 [in_us] via TOPICAL
  Filled 2020-11-23: qty 30

## 2020-11-23 MED ORDER — METOPROLOL SUCCINATE ER 50 MG PO TB24
50.0000 mg | ORAL_TABLET | Freq: Every day | ORAL | Status: DC
  Administered 2020-11-23: 50 mg via ORAL
  Filled 2020-11-23: qty 1

## 2020-11-23 MED ORDER — AMLODIPINE BESYLATE 5 MG PO TABS: 5.0000 mg | ORAL_TABLET | Freq: Every day | ORAL | Status: DC

## 2020-11-23 MED ORDER — AMLODIPINE BESYLATE 5 MG PO TABS
5.0000 mg | ORAL_TABLET | Freq: Every day | ORAL | Status: DC
  Administered 2020-11-23: 5 mg via ORAL
  Filled 2020-11-23: qty 1

## 2020-11-23 MED ORDER — HYDRALAZINE HCL 50 MG PO TABS
100.0000 mg | ORAL_TABLET | Freq: Three times a day (TID) | ORAL | Status: DC
  Administered 2020-11-23 – 2020-11-24 (×3): 100 mg via ORAL
  Filled 2020-11-23 (×4): qty 2

## 2020-11-23 NOTE — Discharge Summary (Addendum)
Discharge Summary  Phillip Oconnor:[id number] DOB: September 30, 1940  PCP: Jenel Lucks, PA-C  Admit date: 11/19/2020 Discharge date: 11/23/2020  Time spent: 35 minutes.  Recommendations for Outpatient Follow-up:  1. Follow-up with your cardiologist in 1 to 2 weeks. 2. Follow-up with your primary care provider within a week. 3. Take your medications as prescribed. 4. Continue seizure precautions.  Discharge Diagnoses:  Active Hospital Problems   Diagnosis Date Noted  . Atrial flutter, paroxysmal ([place]) 11/20/2020  . Long term (current) use of anticoagulants   . DM2 (diabetes mellitus, type 2) ([place]) 11/20/2020  . HTN (hypertension) 11/20/2020  . History of multiple strokes 11/20/2020  . Seizure ([place]) 11/20/2020    Resolved Hospital Problems  No resolved problems to display.    Discharge Condition: Stable.  Diet recommendation: Heart healthy carb modified diet.  Vitals:   11/23/20 1123 11/23/20 1339  BP: (!) 208/80 (!) 180/70  Pulse: 67 68  Resp: 20   Temp:    SpO2:  98%    History of present illness:  Phillip Oconnor a 80 y.o.malewith medical history significant ofDM2, HTN, HLD.  Presented to [facility] ED after receiving a call from his provider that his loop recorder was showing A. fib and that he needed to come to the hospital. He drove himself to [facility]. He had recently been treated for urinary tract infection, completed 10-day course of oral antibiotics on Saturday, 11/17/2020, was otherwise in hisusual state of health.  Upon presentation to the ED, twelve-lead EKG showed a flutter, BNP elevated 800, small bilateral pleural effusions on chest x-ray.   Was started on heparin drip and received 1 dose of IV Lasix 40 mg. Cardiology was consulted by EDP. Hospitalist team asked to admit.  Hospital course complicated by acute drop in hemoglobin of unclear etiology.  He had an anemia work-up.  FOBT was negative, no melena or hematochezia, there were no overt  bleeding and no evidence of iron deficiency.  Vitamin W40 and folic acid levels were borderline.  He was started on folic acid and vitamin B12 supplements.  Curb sided with GI, Dr. Rayne Du, thought this was less likely GI related anemia, suspected anemia of chronic disease.  GI will follow the patient in the clinic.  11/23/20: Was seen at his bedside.  He has no new complaints.  He is eager to go home.  Hospital Course:  Principal Problem:   Atrial flutter, paroxysmal (HCC) Active Problems:   DM2 (diabetes mellitus, type 2) (HCC)   HTN (hypertension)   History of multiple strokes   Seizure ([place])   Long term (current) use of anticoagulants  Atypical atrial flutter CHA2DS2-VASc of 6 Rate controlled on Toprol-XL 50 mg daily Stop home nifedipine while on Toprol-XL to avoid bradycardia. 2D echo done on 11/20/2020 showed LVEF 70 to 75%, no regional wall motion abnormalities.  Positive bubble study.  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  Anemia of chronic disease Acute drop in hemoglobin FOBT negative on 11/22/2020 Iron studies with no evidence of iron deficiency. X73 and folic acid level borderline Continue Z32 and folic acid replacement. Curb sided with GI on 11/22/2020, Dr. Rayne Du.  Per GI, this is less likely GI related anemia, suspects anemia of chronic disease.   GI will follow in the clinic.  Prolonged QTC Admission EKG revealed QTC greater than 500 Repeated twelve-lead EKG done on 11/22/2020 showed QTC 515. Denies any anginal symptoms. Serum potassium 3.7.  Magnesium 1.8. Follow-up with cardiology in 1 to 2 weeks.  Essential hypertension Continue to hold off home losartan due to elevated creatinine 1.5 with GFR 46. Stop home nifedipine while on Toprol-XL to avoid bradycardia. Continue Toprol-XL 50 mg daily, p.o. hydralazine 100 mg 3 times daily.  Added Norvasc 5 mg daily  Follow-up with cardiology or  PCP.  Type 2 diabetes with hyperglycemia Hemoglobin A1c 9.6 on 11/21/2020. Hold off home metformin due to CKD. Continue Lantus 5 units daily, NovoLog 3 units 3 times daily, and home Tradjenta. Follow-up with your PCP within a week.  CKD 3A At his baseline Cr 1.50 with GFR 46 Avoid nephrotoxic agents Follow up with your PCP  History of seizure disorder Continue home Keppra Continue seizure precautions  Hyperlipidemia Continue home Lipitor  BPH Continue home Cardura Good urine output, 1.2 L urine output recorded in the last 24 hours. Follow-up with your PCP.  History of CVA Continue Lipitor 80 mg daily Continue Eliquis for secondary CVA prevention PT OT assessed with no further recommendations.  Small PFO noted on 2D echo with bubble study. Follow-up with cardiology.    Code Status: Full code.   Consultants:  Cardiology.  Procedures:  2D echo.  Antimicrobials:  None.  DVT prophylaxis: Eliquis   Discharge Exam: BP (!) 180/70   Pulse 68   Temp (!) 97.1 F (36.2 C) (Oral)   Resp 20   Ht 6' 4"  (1.93 m)   Wt 93.6 kg   SpO2 98%   BMI 25.11 kg/m  . General: 80 y.o. year-old male well developed well nourished in no acute distress.  Alert and oriented x3. . Cardiovascular: Regular rate and rhythm with no rubs or gallops.  No thyromegaly or JVD noted.   Marland Kitchen Respiratory: Clear to auscultation with no wheezes or rales. Good inspiratory effort. . Abdomen: Soft nontender nondistended with normal bowel sounds x4 quadrants. . Musculoskeletal: No lower extremity edema. 2/4 pulses in all 4 extremities. . Skin: No ulcerative lesions noted or rashes. . Psychiatry: Mood is appropriate for condition and setting  Discharge Instructions You were cared for by a hospitalist during your hospital stay. If you have any questions about your discharge medications or the care you received while you were in the hospital after you are discharged, you can call the unit  and asked to speak with the hospitalist on call if the hospitalist that took care of you is not available. Once you are discharged, your primary care physician will handle any further medical issues. Please note that NO REFILLS for any discharge medications will be authorized once you are discharged, as it is imperative that you return to your primary care physician (or establish a relationship with a primary care physician if you do not have one) for your aftercare needs so that they can reassess your need for medications and monitor your lab values.   Allergies as of 11/23/2020      Reactions   Penicillins Rash, Swelling   Lips swell   Plavix [clopidogrel] Palpitations      Medication List    STOP taking these medications   losartan 100 MG tablet Commonly known as: COZAAR   metFORMIN 500 MG tablet Commonly known as: GLUCOPHAGE   NIFEdipine 90 MG 24 hr tablet Commonly known as: PROCARDIA XL/NIFEDICAL-XL     TAKE these medications   Accu-Chek Softclix Lancets lancets Use as directed up to 6 times daily.   atorvastatin 80 MG tablet Commonly known as: LIPITOR Take 1 tablet (  80 mg total) by mouth daily. What changed:   medication strength  how much to take   Basaglar KwikPen 100 UNIT/ML Inject 5 Units into the skin daily.   blood glucose meter kit and supplies Dispense based on patient and insurance preference. Use up to four times daily as directed. (FOR ICD-10 E10.9, E11.9).   cyanocobalamin 1000 MCG/ML injection Commonly known as: (VITAMIN B-12) Inject 1 mL (1,000 mcg total) into the muscle every 30 (thirty) days. Start taking on: Dec 22, 2020   doxazosin 4 MG tablet Commonly known as: CARDURA Take 4 mg by mouth daily.   Eliquis 5 MG Tabs tablet Generic drug: apixaban Take 1 tablet (5 mg total) by mouth 2 (two) times daily.   folic acid 1 MG tablet Commonly known as: FOLVITE Take 1 tablet (1 mg total) by mouth daily.   glucose blood test strip Use as  instructed   hydrALAZINE 100 MG tablet Commonly known as: APRESOLINE Take 100 mg by mouth 3 (three) times daily.   levETIRAcetam 500 MG tablet Commonly known as: KEPPRA Take 1 tablet (500 mg total) by mouth 2 (two) times daily. What changed: when to take this   linagliptin 5 MG Tabs tablet Commonly known as: TRADJENTA Take 1 tablet by mouth daily.   metoprolol succinate 50 MG 24 hr tablet Commonly known as: TOPROL-XL Take 1 tablet (50 mg total) by mouth daily. Take with or immediately following a meal.   NovoLOG FlexPen 100 UNIT/ML FlexPen Generic drug: insulin aspart Inject 3 Units into the skin 3 (three) times daily with meals.   PenTips 32G X 4 MM Misc Generic drug: Insulin Pen Needle 100 each by Does not apply route 4 (four) times daily - after meals and at bedtime.      Allergies  Allergen Reactions  . Penicillins Rash and Swelling    Lips swell  . Plavix [Clopidogrel] Palpitations    Follow-up Information    Jenel Lucks, PA-C. Call in 1 day(s).   Specialty: Internal Medicine Why: please call for a post hospital follow up appointment Contact information: [place] [address] [phone]        Rex Kras, DO. Call in 1 day(s).   Specialties: Cardiology, Radiology, Vascular Surgery Why: please call for a post hospital follow up appointment Contact information: [place] [address] [phone]        Mauri Pole, MD. Call in 1 day(s).   Specialty: Gastroenterology Why: please call for a post hospital follow up appointment Contact information: [place] [address] [phone]                The results of significant diagnostics from this hospitalization (including imaging, microbiology, ancillary and laboratory) are listed below for reference.    Significant Diagnostic Studies: DG Chest 2 View  Result Date: 11/19/2020 CLINICAL DATA:  Atrial  fibrillation. EXAM: CHEST - 2 VIEW COMPARISON:  None. FINDINGS: The heart size and mediastinal contours are within normal limits. No pneumothorax is noted. Small bilateral pleural effusions are noted. No consolidative process is noted. The visualized skeletal structures are unremarkable. IMPRESSION: Small bilateral pleural effusions. Electronically Signed   By: Marijo Conception M.D.   On: 11/19/2020 18:13   CT Head Wo Contrast  Result Date: 11/19/2020 CLINICAL DATA:  Stroke follow-up EXAM: CT HEAD WITHOUT CONTRAST TECHNIQUE: Contiguous axial images were obtained from the base of the skull through the vertex without intravenous  contrast. COMPARISON:  None. FINDINGS: Brain: Mild atrophy. Negative for hydrocephalus. Chronic infarct right frontal lobe. Hypodensity in the frontal white matter bilaterally right greater than left. Negative for acute cortical infarct, hemorrhage, mass. Vascular: Negative for hyperdense vessel Skull: Negative Sinuses/Orbits: Mucoperiosteal thickening right maxillary sinus. Negative orbit Other: None IMPRESSION: No acute abnormality.  Chronic infarct right frontal lobe. Electronically Signed   By: Franchot Gallo M.D.   On: 11/19/2020 20:25   ECHOCARDIOGRAM COMPLETE  Result Date: 11/20/2020    ECHOCARDIOGRAM REPORT   Patient Name:   CELVIN TANEY Date of Exam: 11/20/2020 Medical Rec #:  [id number]      Height:       76.0 in Accession #:    [id number]     Weight:       205.7 lb Date of Birth:  05-26-1941      BSA:          2.242 m Patient Age:    63 years       BP:           140/56 mmHg Patient Gender: M              HR:           66 bpm. Exam Location:  Inpatient Procedure: 2D Echo, Cardiac Doppler and Color Doppler Indications:    Atrial fibrillation  History:        Patient has no prior history of Echocardiogram examinations.                 Stroke; Risk Factors:Hypertension, Diabetes and hyperlipidemia.  Sonographer:    Luisa Hart RDCS Referring Phys: 73 JARED M GARDNER  Sonographer  Comments: Technically difficult study due to poor echo windows. IMPRESSIONS  1. Left ventricular ejection fraction, by estimation, is 70 to 75%. The left ventricle has hyperdynamic function. The left ventricle has no regional wall motion abnormalities. There is mild left ventricular hypertrophy. Left ventricular diastolic parameters are indeterminate.  2. Right ventricular systolic function is normal. The right ventricular size is normal. There is mildly elevated pulmonary artery systolic pressure. The estimated right ventricular systolic pressure is 07.1 mmHg.  3. The mitral valve is grossly normal. Trivial mitral valve regurgitation.  4. The aortic valve is tricuspid. Aortic valve regurgitation is not visualized. Mild aortic valve sclerosis is present, with no evidence of aortic valve stenosis.  5. Evidence of atrial level shunting detected by color flow Doppler. Agitated saline contrast bubble study was positive with shunting observed within 3-6 cardiac cycles suggestive of interatrial shunt. 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 Comparison(s): No prior Echocardiogram. FINDINGS  Left Ventricle: Left ventricular ejection fraction, by estimation, is 70 to 75%. The left ventricle has hyperdynamic function. The left ventricle has no regional wall motion abnormalities. The left ventricular internal cavity size was normal in size. There is mild left ventricular hypertrophy. Left ventricular diastolic function could not be evaluated due to atrial fibrillation. Left ventricular diastolic parameters are indeterminate. Right Ventricle: The right ventricular size is normal. No increase in right ventricular wall thickness. Right ventricular systolic function is normal. There is mildly elevated pulmonary artery systolic pressure. The tricuspid regurgitant velocity is 2.99  m/s, and with an assumed right atrial pressure of 3 mmHg, the estimated right  ventricular systolic pressure is 21.9 mmHg. Left Atrium: Left atrial size was normal in size. Right Atrium: Right atrial size was normal in size. Pericardium: There is  no evidence of pericardial effusion. Mitral Valve: The mitral valve is grossly normal. Trivial mitral valve regurgitation. Tricuspid Valve: The tricuspid valve is grossly normal. Tricuspid valve regurgitation is trivial. Aortic Valve: The aortic valve is tricuspid. Aortic valve regurgitation is not visualized. Mild aortic valve sclerosis is present, with no evidence of aortic valve stenosis. Aortic valve mean gradient measures 3.0 mmHg. Aortic valve peak gradient measures 5.8 mmHg. Aortic valve area, by VTI measures 2.49 cm. Pulmonic Valve: The pulmonic valve was grossly normal. Pulmonic valve regurgitation is trivial. Aorta: The aortic root and ascending aorta are structurally normal, with no evidence of dilitation. IAS/Shunts: The interatrial septum is aneurysmal. Evidence of atrial level shunting detected by color flow Doppler. Agitated saline contrast bubble study was positive with shunting observed within 3-6 cardiac cycles suggestive of interatrial shunt. A small patent foramen ovale is detected with predominantly left to right shunting across the atrial septum.  LEFT VENTRICLE PLAX 2D LVIDd:         4.90 cm     Diastology LVIDs:         3.00 cm     LV e' medial:    9.37 cm/s LV PW:         1.10 cm     LV E/e' medial:  9.7 LV IVS:        1.40 cm     LV e' lateral:   12.60 cm/s LVOT diam:     2.10 cm     LV E/e' lateral: 7.2 LV SV:         63 LV SV Index:   28 LVOT Area:     3.46 cm  LV Volumes (MOD) LV vol d, MOD A2C: 68.6 ml LV vol d, MOD A4C: 56.8 ml LV vol s, MOD A2C: 19.1 ml LV vol s, MOD A4C: 13.3 ml LV SV MOD A2C:     49.5 ml LV SV MOD A4C:     56.8 ml LV SV MOD BP:      47.0 ml RIGHT VENTRICLE RV S prime:     18.50 cm/s TAPSE (M-mode): 2.4 cm LEFT ATRIUM           Index LA diam:      3.60 cm 1.61 cm/m LA Vol (A4C): 37.1 ml 16.55 ml/m   AORTIC VALVE                   PULMONIC VALVE AV Area (Vmax):    2.56 cm    PV Vmax:       1.56 m/s AV Area (Vmean):   2.35 cm    PV Vmean:      101.000 cm/s AV Area (VTI):     2.49 cm    PV VTI:        0.271 m AV Vmax:           120.00 cm/s PV Peak grad:  9.7 mmHg AV Vmean:          85.900 cm/s PV Mean grad:  5.0 mmHg AV VTI:            0.253 m AV Peak Grad:      5.8 mmHg AV Mean Grad:      3.0 mmHg LVOT Vmax:         88.60 cm/s LVOT Vmean:        58.200 cm/s LVOT VTI:          0.182 m LVOT/AV VTI ratio: 0.72  AORTA Ao Root diam:  3.60 cm Ao Asc diam:  3.90 cm MITRAL VALVE               TRICUSPID VALVE MV Area (PHT): 4.21 cm    TR Peak grad:   35.8 mmHg MV Decel Time: 180 msec    TR Vmax:        299.00 cm/s MV E velocity: 91.30 cm/s MV A velocity: 46.00 cm/s  SHUNTS MV E/A ratio:  1.98        Systemic VTI:  0.18 m                            Systemic Diam: 2.10 cm Lyman Bishop MD Electronically signed by Lyman Bishop MD Signature Date/Time: 11/20/2020/3:36:07 PM    Final     Microbiology: Recent Results (from the past 240 hour(s))  Resp Panel by RT-PCR (Flu A&B, Covid) Nasopharyngeal Swab     Status: None   Collection Time: 11/19/20  6:13 PM   Specimen: Nasopharyngeal Swab; Nasopharyngeal(NP) swabs in vial transport medium  Result Value Ref Range Status   SARS Coronavirus 2 by RT PCR NEGATIVE NEGATIVE Final    Comment: (NOTE) SARS-CoV-2 target nucleic acids are NOT DETECTED.  The SARS-CoV-2 RNA is generally detectable in upper respiratory specimens during the acute phase of infection. The lowest concentration of SARS-CoV-2 viral copies this assay can detect is 138 copies/mL. A negative result does not preclude SARS-Cov-2 infection and should not be used as the sole basis for treatment or other patient management decisions. A negative result may occur with  improper specimen collection/handling, submission of specimen other than nasopharyngeal swab, presence of viral mutation(s) within  the areas targeted by this assay, and inadequate number of viral copies(<138 copies/mL). A negative result must be combined with clinical observations, patient history, and epidemiological information. The expected result is Negative.  Fact Sheet for Patients:  EntrepreneurPulse.com.au  Fact Sheet for Healthcare Providers:  IncredibleEmployment.be  This test is no t yet approved or cleared by the [place] FDA and  has been authorized for detection and/or diagnosis of SARS-CoV-2 by FDA under an Emergency Use Authorization (EUA). This EUA will remain  in effect (meaning this test can be used) for the duration of the COVID-19 declaration under Section 564(b)(1) of the Act, 21 U.S.C.section 360bbb-3(b)(1), unless the authorization is terminated  or revoked sooner.       Influenza A by PCR NEGATIVE NEGATIVE Final   Influenza B by PCR NEGATIVE NEGATIVE Final    Comment: (NOTE) The Xpert Xpress SARS-CoV-2/FLU/RSV plus assay is intended as an aid in the diagnosis of influenza from Nasopharyngeal swab specimens and should not be used as a sole basis for treatment. Nasal washings and aspirates are unacceptable for Xpert Xpress SARS-CoV-2/FLU/RSV testing.  Fact Sheet for Patients: EntrepreneurPulse.com.au  Fact Sheet for Healthcare Providers: IncredibleEmployment.be  This test is not yet approved or cleared by the [place] FDA and has been authorized for detection and/or diagnosis of SARS-CoV-2 by FDA under an Emergency Use Authorization (EUA). This EUA will remain in effect (meaning this test can be used) for the duration of the COVID-19 declaration under Section 564(b)(1) of the Act, 21 U.S.C. section 360bbb-3(b)(1), unless the authorization is terminated or revoked.  Performed at [facility], [address] [place], [place] [address]   Culture, Urine     Status: Abnormal   Collection  Time: 11/20/20 12:00 PM   Specimen: Urine, Random  Result  Value Ref Range Status   Specimen Description URINE, RANDOM  Final   Special Requests NONE  Final   Culture (A)  Final    <10,000 COLONIES/mL INSIGNIFICANT GROWTH Performed at [facility], [place] [address], [place], [place] [address]    Report Status 11/21/2020 FINAL  Final     Labs: Basic Metabolic Panel: Recent Labs  Lab 11/20/20 0817 11/21/20 0340 11/21/20 1610 11/22/20 0338 11/23/20 0219  NA 135 133* 134* 135 136  K 3.4* 3.2* 3.9 3.8 3.7  CL 100 100 103 105 106  CO2 25 24 25 25 24   GLUCOSE 250* 222* 216* 210* 65*  BUN 13 16 16 15 12   CREATININE 1.42* 1.60* 1.47* 1.43* 1.50*  CALCIUM 8.9 8.4* 8.3* 8.2* 8.5*  MG 1.4*  --  1.8  --   --    Liver Function Tests: Recent Labs  Lab 11/19/20 1650 11/20/20 0817  AST 30 20  ALT 29 21  ALKPHOS 132* 100  BILITOT 1.0 1.0  PROT 7.3 5.6*  ALBUMIN 3.4* 2.6*   Recent Labs  Lab 11/19/20 1650  LIPASE 36   No results for input(s): AMMONIA in the last 168 hours. CBC: Recent Labs  Lab 11/20/20 0817 11/20/20 1740 11/21/20 0340 11/21/20 1657 11/22/20 0338 11/22/20 0746 11/23/20 0219  WBC 17.2*  --  11.2* 8.6 7.8  --  6.6  NEUTROABS 14.3*  --   --   --   --   --   --   HGB 9.6*   < > 9.0* 9.3* 8.5* 8.9* 8.8*  HCT 28.4*   < > 26.4* 27.5* 24.8* 26.4* 26.3*  MCV 92.5  --  92.6 92.9 93.6  --  93.3  PLT 349  --  365 388 354  --  336   < > = values in this interval not displayed.   Cardiac Enzymes: No results for input(s): CKTOTAL, CKMB, CKMBINDEX, TROPONINI in the last 168 hours. BNP: BNP (last 3 results) Recent Labs    11/19/20 1650  BNP 897.2*    ProBNP (last 3 results) No results for input(s): PROBNP in the last 8760 hours.  CBG: Recent Labs  Lab 11/22/20 1227 11/22/20 1711 11/22/20 2046 11/23/20 0806 11/23/20 1231  GLUCAP 169* 104* 111* 71 148*       Signed:  Kayleen Memos, MD [facility] 11/23/2020, 3:03 PM

## 2020-11-23 NOTE — Progress Notes (Signed)
Discharge delayed due to uncontrolled BP, latest BP 197/108.  Added Norvasc 5 mg daily.  Continue to titrate BP medications to achieve goal BP.

## 2020-11-23 NOTE — Plan of Care (Signed)

## 2020-11-23 NOTE — Progress Notes (Addendum)
   11/23/20 1111  Vitals  BP (!) 194/86  MAP (mmHg) 114  BP Location Left Arm  BP Method Automatic  Patient Position (if appropriate) Sitting  Pulse Rate 67  Pulse Rate Source Monitor  ECG Heart Rate 68  Resp 18  MEWS COLOR  MEWS Score Color Green  MEWS Score  MEWS Temp 0  MEWS Systolic 0  MEWS Pulse 0  MEWS RR 0  MEWS LOC 0  MEWS Score 0    Vitals at 1111 before ambulation  After ambulation Vital signs  BP  208/80 MAP   119 Location  Left Arm Method  Automatic Position  Sitting  Pulse  67 Resp   20  Notified Dr. [place] Crane   Medications given as ordered  Rechecked at 1339 at rest  BP   180/70  MAP   100 Location  Left Arm Method  Automatic  Position Sitting  Pulse   68  Notified Dr. [place] Crane  Orders received and patient medicated

## 2020-11-23 NOTE — Progress Notes (Signed)
Patient having 7/10 chest pressure and shortness of breath. Patients vitals 172/75 HR 72 98% RA RR18. Paged [facility], got orders to restart the nitro paste.   Upon reassessment patient resting watching TV stating that he feels much better. Will continue to monitor patient.

## 2020-11-24 DIAGNOSIS — N1831 Chronic kidney disease, stage 3a: Secondary | ICD-10-CM

## 2020-11-24 DIAGNOSIS — R072 Precordial pain: Secondary | ICD-10-CM

## 2020-11-24 DIAGNOSIS — Z955 Presence of coronary angioplasty implant and graft: Secondary | ICD-10-CM

## 2020-11-24 DIAGNOSIS — I2510 Atherosclerotic heart disease of native coronary artery without angina pectoris: Secondary | ICD-10-CM

## 2020-11-24 LAB — BASIC METABOLIC PANEL
Anion gap: 6 (ref 5–15)
BUN: 10 mg/dL (ref 8–23)
CO2: 28 mmol/L (ref 22–32)
Calcium: 9.1 mg/dL (ref 8.9–10.3)
Chloride: 101 mmol/L (ref 98–111)
Creatinine, Ser: 1.46 mg/dL — ABNORMAL HIGH (ref 0.61–1.24)
GFR, Estimated: 48 mL/min — ABNORMAL LOW (ref >60)
Glucose, Bld: 236 mg/dL — ABNORMAL HIGH (ref 70–99)
Potassium: 3.9 mmol/L (ref 3.5–5.1)
Sodium: 135 mmol/L (ref 135–145)

## 2020-11-24 LAB — GLUCOSE, CAPILLARY
Glucose-Capillary: 195 mg/dL — ABNORMAL HIGH (ref 70–99)
Glucose-Capillary: 259 mg/dL — ABNORMAL HIGH (ref 70–99)
Glucose-Capillary: 206 mg/dL — ABNORMAL HIGH (ref 70–99)
Glucose-Capillary: 74 mg/dL (ref 70–99)
Glucose-Capillary: 86 mg/dL (ref 70–99)

## 2020-11-24 LAB — TROPONIN I (HIGH SENSITIVITY)
Troponin I (High Sensitivity): 38 ng/L — ABNORMAL HIGH (ref <18)
Troponin I (High Sensitivity): 35 ng/L — ABNORMAL HIGH (ref <18)

## 2020-11-24 MED ORDER — HYDRALAZINE HCL 50 MG PO TABS
50.0000 mg | ORAL_TABLET | Freq: Three times a day (TID) | ORAL | Status: DC
  Administered 2020-11-25 (×2): 50 mg via ORAL
  Filled 2020-11-24 (×3): qty 1

## 2020-11-24 MED ORDER — INSULIN ASPART 100 UNIT/ML ~~LOC~~ SOLN
3.0000 [IU] | Freq: Three times a day (TID) | SUBCUTANEOUS | Status: DC
  Administered 2020-11-24 – 2020-11-25 (×5): 3 [IU] via SUBCUTANEOUS

## 2020-11-24 MED ORDER — NIFEDIPINE ER OSMOTIC RELEASE 90 MG PO TB24
90.0000 mg | ORAL_TABLET | Freq: Every day | ORAL | Status: DC
  Administered 2020-11-24 – 2020-11-25 (×2): 90 mg via ORAL
  Filled 2020-11-24 (×2): qty 1

## 2020-11-24 MED ORDER — AMLODIPINE BESYLATE 10 MG PO TABS: 10.0000 mg | ORAL_TABLET | Freq: Every day | ORAL | Status: DC

## 2020-11-24 MED ORDER — ASPIRIN 81 MG PO CHEW
81.0000 mg | CHEWABLE_TABLET | Freq: Every day | ORAL | Status: DC
  Administered 2020-11-24 – 2020-11-25 (×2): 81 mg via ORAL
  Filled 2020-11-24 (×2): qty 1

## 2020-11-24 MED ORDER — LINAGLIPTIN 5 MG PO TABS
5.0000 mg | ORAL_TABLET | Freq: Every day | ORAL | Status: DC
  Administered 2020-11-24 – 2020-11-25 (×2): 5 mg via ORAL
  Filled 2020-11-24 (×2): qty 1

## 2020-11-24 MED ORDER — CARVEDILOL 3.125 MG PO TABS
3.1250 mg | ORAL_TABLET | Freq: Two times a day (BID) | ORAL | Status: DC
  Administered 2020-11-25: 3.125 mg via ORAL
  Filled 2020-11-24: qty 1

## 2020-11-24 MED ORDER — CARVEDILOL 6.25 MG PO TABS
6.2500 mg | ORAL_TABLET | Freq: Two times a day (BID) | ORAL | Status: DC
  Administered 2020-11-24: 6.25 mg via ORAL
  Filled 2020-11-24 (×2): qty 1

## 2020-11-24 MED ORDER — CARVEDILOL 6.25 MG PO TABS: 6.2500 mg | ORAL_TABLET | Freq: Two times a day (BID) | ORAL | 0 refills | Status: DC

## 2020-11-24 NOTE — Plan of Care (Signed)

## 2020-11-24 NOTE — Progress Notes (Addendum)
PROGRESS NOTE  Phillip Oconnor DGU:[id number] DOB: June 16, 1941  PCP: Jenel Lucks, PA-C  Admit date: 11/19/2020 Discharge date: 11/24/2020  Time spent: 35 minutes.  Recommendations for Outpatient Follow-up:  1. Follow-up with your cardiologist in 1 to 2 weeks. 2. Follow-up with your primary care provider within a week. 3. Take your medications as prescribed. 4. Continue seizure precautions.  Discharge Diagnoses:  Active Hospital Problems   Diagnosis Date Noted  . Atrial flutter, paroxysmal ([place]) 11/20/2020  . Long term (current) use of anticoagulants   . DM2 (diabetes mellitus, type 2) ([place]) 11/20/2020  . HTN (hypertension) 11/20/2020  . History of multiple strokes 11/20/2020  . Seizure ([place]) 11/20/2020    Resolved Hospital Problems  No resolved problems to display.    Discharge Condition: Stable.  Diet recommendation: Heart healthy carb modified diet.  Vitals:   11/24/20 0900 11/24/20 1040  BP: (!) 166/71 (!) 156/80  Pulse: 65 66  Resp:    Temp:    SpO2: 97% 98%    History of present illness:  Phillip Oconnor a 79 y.o.malewith medical history significant ofDM2, HTN, HLD.  Presented to [facility] ED after receiving a call from his provider that his loop recorder was showing A. fib and that he needed to come to the hospital. He drove himself to [facility]. He had recently been treated for urinary tract infection, completed 10-day course of oral antibiotics on Saturday, 11/17/2020, was otherwise in hisusual state of health.  Upon presentation to the ED, twelve-lead EKG showed a flutter, BNP elevated 800, small bilateral pleural effusions on chest x-ray.   Was started on heparin drip and received 1 dose of IV Lasix 40 mg. Cardiology was consulted by EDP. Hospitalist team asked to admit.  Hospital course complicated by acute drop in hemoglobin of unclear etiology.  He had an anemia work-up.  FOBT was negative, no melena or hematochezia, there were no overt bleeding  and no evidence of iron deficiency.  Vitamin Z56 and folic acid levels were borderline.  He was started on folic acid and vitamin B12 supplements.  Curb sided with GI, Dr. Rayne Du, thought this was less likely GI related anemia, suspected anemia of chronic disease.  GI will follow the patient in the clinic.  11/24/20: Discharge was delayed due to uncontrolled HTN and this AM with complaints of chest pain.   Hospital Course:  Principal Problem:   Atrial flutter, paroxysmal (HCC) Active Problems:   DM2 (diabetes mellitus, type 2) (HCC)   HTN (hypertension)   History of multiple strokes   Seizure ([place])   Long term (current) use of anticoagulants  Atypical atrial flutter CHA2DS2-VASc of 6 Rate controlled on Toprol-XL 50 mg daily Stop home nifedipine while on Toprol-XL to avoid bradycardia. 2D echo done on 11/20/2020 showed LVEF 70 to 75%, no regional wall motion abnormalities.  Positive bubble study.  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  Chest pain possibly related to uncontrolled hypertension Troponin peaked at 38 today and trended down. A-flutter and prolonged QTC on 12 EKG, no evidence of acute ischemia. Nitroglycerin ointment restarted by cardiology Cardio medications adjusted by cardiology Monitor on telemetry  Uncontrolled essential hypertension BP is not at goal. Home losartan on hold due to elevated creatinine 1.5 with GFR 46. Resume home nifedipine per cardiology. Continue Toprol-XL 50 mg daily, p.o. hydralazine 100 mg 3 times daily.   Follow-up with cardiology or PCP.  Anemia of chronic disease Acute  drop in hemoglobin FOBT negative on 11/22/2020 Iron studies with no evidence of iron deficiency. G86 and folic acid level borderline Continue P61 and folic acid replacement. Curb sided with GI on 11/22/2020, Dr. Rayne Du.  Per GI, this is less likely GI related anemia, suspects anemia of chronic disease.    GI will follow in the clinic.  Prolonged QTC Admission EKG revealed QTC greater than 500 Repeated twelve-lead EKG done on 11/24/2020 showed QTC 555. Potassium 3.9. Magnesium 1.8. Follow-up with cardiology in 1 to 2 weeks.  Type 2 diabetes with hyperglycemia Hemoglobin A1c 9.6 on 11/21/2020. Hold off home metformin due to CKD. Continue Lantus 5 units daily, NovoLog 3 units 3 times daily, and home Tradjenta. Follow-up with your PCP within a week.  CKD 3A At his baseline Cr 1. 46 with GFR 48 Avoid nephrotoxic agents Follow up with your PCP  History of seizure disorder Continue home Keppra Continue seizure precautions  Hyperlipidemia Continue home Lipitor  BPH Continue home Cardura Good urine output, 1.2 L urine output recorded in the last 24 hours. Follow-up with your PCP.  History of CVA Continue Lipitor 80 mg daily Continue Eliquis for secondary CVA prevention PT OT assessed with no further recommendations.  Small PFO noted on 2D echo with bubble study. Follow-up with cardiology.    Code Status: Full code.   Consultants:  Cardiology.  Procedures:  2D echo.  Antimicrobials:  None.  DVT prophylaxis: Eliquis   Discharge Exam: BP (!) 156/80   Pulse 66   Temp 97.6 F (36.4 C) (Oral)   Resp 17   Ht 6' 4"  (1.93 m)   Wt 92.7 kg   SpO2 98%   BMI 24.88 kg/m  . General: 80 y.o. year-old male frail-appearing no acute distress.  He is alert oriented x3.   . Cardiovascular: Regular rate and rhythm no rubs or gallops..   . Respiratory: Clear to auscultation no wheezes or rales.   . Abdomen: Soft nontender normal bowel sounds present.  . Musculoskeletal: No lower extremity edema bilaterally. . Skin: No ulcerative lesions noted. Marland Kitchen Psychiatry: Mood is appropriate for condition and setting.  Discharge Instructions You were cared for by a hospitalist during your hospital stay. If you have any questions about your discharge medications or the  care you received while you were in the hospital after you are discharged, you can call the unit and asked to speak with the hospitalist on call if the hospitalist that took care of you is not available. Once you are discharged, your primary care physician will handle any further medical issues. Please note that NO REFILLS for any discharge medications will be authorized once you are discharged, as it is imperative that you return to your primary care physician (or establish a relationship with a primary care physician if you do not have one) for your aftercare needs so that they can reassess your need for medications and monitor your lab values.   Allergies as of 11/24/2020      Reactions   Penicillins Rash, Swelling   Lips swell   Plavix [clopidogrel] Palpitations      Medication List    STOP taking these medications   losartan 100 MG tablet Commonly known as: COZAAR   metFORMIN 500 MG tablet Commonly known as: GLUCOPHAGE     TAKE these medications   Accu-Chek Softclix Lancets lancets Use as directed up to 6 times daily.   atorvastatin 80 MG tablet Commonly known as: LIPITOR Take 1 tablet (80 mg total)  by mouth daily. What changed:   medication strength  how much to take   Basaglar KwikPen 100 UNIT/ML Inject 5 Units into the skin daily.   blood glucose meter kit and supplies Dispense based on patient and insurance preference. Use up to four times daily as directed. (FOR ICD-10 E10.9, E11.9).   carvedilol 6.25 MG tablet Commonly known as: COREG Take 1 tablet (6.25 mg total) by mouth 2 (two) times daily with a meal.   cyanocobalamin 1000 MCG/ML injection Commonly known as: (VITAMIN B-12) Inject 1 mL (1,000 mcg total) into the muscle every 30 (thirty) days. Start taking on: Dec 22, 2020   doxazosin 4 MG tablet Commonly known as: CARDURA Take 4 mg by mouth daily.   Eliquis 5 MG Tabs tablet Generic drug: apixaban Take 1 tablet (5 mg total) by mouth 2 (two) times  daily.   folic acid 1 MG tablet Commonly known as: FOLVITE Take 1 tablet (1 mg total) by mouth daily.   glucose blood test strip Use as instructed   hydrALAZINE 100 MG tablet Commonly known as: APRESOLINE Take 100 mg by mouth 3 (three) times daily.   levETIRAcetam 500 MG tablet Commonly known as: KEPPRA Take 1 tablet (500 mg total) by mouth 2 (two) times daily. What changed: when to take this   linagliptin 5 MG Tabs tablet Commonly known as: TRADJENTA Take 1 tablet by mouth daily.   NIFEdipine 90 MG 24 hr tablet Commonly known as: PROCARDIA XL/NIFEDICAL-XL Take 90 mg by mouth daily.   NovoLOG FlexPen 100 UNIT/ML FlexPen Generic drug: insulin aspart Inject 3 Units into the skin 3 (three) times daily with meals.   PenTips 32G X 4 MM Misc Generic drug: Insulin Pen Needle 100 each by Does not apply route 4 (four) times daily - after meals and at bedtime.      Allergies  Allergen Reactions  . Penicillins Rash and Swelling    Lips swell  . Plavix [Clopidogrel] Palpitations    Follow-up Information    Jenel Lucks, PA-C. Call in 1 day(s).   Specialty: Internal Medicine Why: please call for a post hospital follow up appointment Contact information: [place] [address] [phone]        Rex Kras, DO. Call in 1 day(s).   Specialties: Cardiology, Radiology, Vascular Surgery Why: please call for a post hospital follow up appointment Contact information: [place] [address] [phone]        Mauri Pole, MD. Call in 1 day(s).   Specialty: Gastroenterology Why: please call for a post hospital follow up appointment Contact information: [place] [phone] [phone]                The results of significant diagnostics from this hospitalization (including imaging, microbiology, ancillary and laboratory) are listed below for reference.    Significant  Diagnostic Studies: DG Chest 2 View  Result Date: 11/19/2020 CLINICAL DATA:  Atrial fibrillation. EXAM: CHEST - 2 VIEW COMPARISON:  None. FINDINGS: The heart size and mediastinal contours are within normal limits. No pneumothorax is noted. Small bilateral pleural effusions are noted. No consolidative process is noted. The visualized skeletal structures are unremarkable. IMPRESSION: Small bilateral pleural effusions. Electronically Signed   By: Marijo Conception M.D.   On: 11/19/2020 18:13   CT Head Wo Contrast  Result Date: 11/19/2020 CLINICAL DATA:  Stroke follow-up EXAM: CT HEAD WITHOUT CONTRAST TECHNIQUE: Contiguous axial images were  obtained from the base of the skull through the vertex without intravenous contrast. COMPARISON:  None. FINDINGS: Brain: Mild atrophy. Negative for hydrocephalus. Chronic infarct right frontal lobe. Hypodensity in the frontal white matter bilaterally right greater than left. Negative for acute cortical infarct, hemorrhage, mass. Vascular: Negative for hyperdense vessel Skull: Negative Sinuses/Orbits: Mucoperiosteal thickening right maxillary sinus. Negative orbit Other: None IMPRESSION: No acute abnormality.  Chronic infarct right frontal lobe. Electronically Signed   By: Franchot Gallo M.D.   On: 11/19/2020 20:25   ECHOCARDIOGRAM COMPLETE  Result Date: 11/20/2020    ECHOCARDIOGRAM REPORT   Patient Name:   PATRICIA PERALES Date of Exam: 11/20/2020 Medical Rec #:  [id number]      Height:       76.0 in Accession #:    [id number]     Weight:       205.7 lb Date of Birth:  03-20-1941      BSA:          2.242 m Patient Age:    27 years       BP:           140/56 mmHg Patient Gender: M              HR:           66 bpm. Exam Location:  Inpatient Procedure: 2D Echo, Cardiac Doppler and Color Doppler Indications:    Atrial fibrillation  History:        Patient has no prior history of Echocardiogram examinations.                 Stroke; Risk Factors:Hypertension, Diabetes and hyperlipidemia.   Sonographer:    Luisa Hart RDCS Referring Phys: 55 JARED M GARDNER  Sonographer Comments: Technically difficult study due to poor echo windows. IMPRESSIONS  1. Left ventricular ejection fraction, by estimation, is 70 to 75%. The left ventricle has hyperdynamic function. The left ventricle has no regional wall motion abnormalities. There is mild left ventricular hypertrophy. Left ventricular diastolic parameters are indeterminate.  2. Right ventricular systolic function is normal. The right ventricular size is normal. There is mildly elevated pulmonary artery systolic pressure. The estimated right ventricular systolic pressure is 30.0 mmHg.  3. The mitral valve is grossly normal. Trivial mitral valve regurgitation.  4. The aortic valve is tricuspid. Aortic valve regurgitation is not visualized. Mild aortic valve sclerosis is present, with no evidence of aortic valve stenosis.  5. Evidence of atrial level shunting detected by color flow Doppler. Agitated saline contrast bubble study was positive with shunting observed within 3-6 cardiac cycles suggestive of interatrial shunt. 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 Comparison(s): No prior Echocardiogram. FINDINGS  Left Ventricle: Left ventricular ejection fraction, by estimation, is 70 to 75%. The left ventricle has hyperdynamic function. The left ventricle has no regional wall motion abnormalities. The left ventricular internal cavity size was normal in size. There is mild left ventricular hypertrophy. Left ventricular diastolic function could not be evaluated due to atrial fibrillation. Left ventricular diastolic parameters are indeterminate. Right Ventricle: The right ventricular size is normal. No increase in right ventricular wall thickness. Right ventricular systolic function is normal. There is mildly elevated pulmonary artery systolic pressure. The tricuspid regurgitant velocity is 2.99   m/s, and with an assumed right atrial pressure of 3 mmHg, the estimated right ventricular systolic pressure is 92.3 mmHg. Left Atrium: Left atrial size was normal in size.  Right Atrium: Right atrial size was normal in size. Pericardium: There is no evidence of pericardial effusion. Mitral Valve: The mitral valve is grossly normal. Trivial mitral valve regurgitation. Tricuspid Valve: The tricuspid valve is grossly normal. Tricuspid valve regurgitation is trivial. Aortic Valve: The aortic valve is tricuspid. Aortic valve regurgitation is not visualized. Mild aortic valve sclerosis is present, with no evidence of aortic valve stenosis. Aortic valve mean gradient measures 3.0 mmHg. Aortic valve peak gradient measures 5.8 mmHg. Aortic valve area, by VTI measures 2.49 cm. Pulmonic Valve: The pulmonic valve was grossly normal. Pulmonic valve regurgitation is trivial. Aorta: The aortic root and ascending aorta are structurally normal, with no evidence of dilitation. IAS/Shunts: The interatrial septum is aneurysmal. Evidence of atrial level shunting detected by color flow Doppler. Agitated saline contrast bubble study was positive with shunting observed within 3-6 cardiac cycles suggestive of interatrial shunt. A small patent foramen ovale is detected with predominantly left to right shunting across the atrial septum.  LEFT VENTRICLE PLAX 2D LVIDd:         4.90 cm     Diastology LVIDs:         3.00 cm     LV e' medial:    9.37 cm/s LV PW:         1.10 cm     LV E/e' medial:  9.7 LV IVS:        1.40 cm     LV e' lateral:   12.60 cm/s LVOT diam:     2.10 cm     LV E/e' lateral: 7.2 LV SV:         63 LV SV Index:   28 LVOT Area:     3.46 cm  LV Volumes (MOD) LV vol d, MOD A2C: 68.6 ml LV vol d, MOD A4C: 56.8 ml LV vol s, MOD A2C: 19.1 ml LV vol s, MOD A4C: 13.3 ml LV SV MOD A2C:     49.5 ml LV SV MOD A4C:     56.8 ml LV SV MOD BP:      47.0 ml RIGHT VENTRICLE RV S prime:     18.50 cm/s TAPSE (M-mode): 2.4 cm LEFT ATRIUM            Index LA diam:      3.60 cm 1.61 cm/m LA Vol (A4C): 37.1 ml 16.55 ml/m  AORTIC VALVE                   PULMONIC VALVE AV Area (Vmax):    2.56 cm    PV Vmax:       1.56 m/s AV Area (Vmean):   2.35 cm    PV Vmean:      101.000 cm/s AV Area (VTI):     2.49 cm    PV VTI:        0.271 m AV Vmax:           120.00 cm/s PV Peak grad:  9.7 mmHg AV Vmean:          85.900 cm/s PV Mean grad:  5.0 mmHg AV VTI:            0.253 m AV Peak Grad:      5.8 mmHg AV Mean Grad:      3.0 mmHg LVOT Vmax:         88.60 cm/s LVOT Vmean:        58.200 cm/s LVOT VTI:  0.182 m LVOT/AV VTI ratio: 0.72  AORTA Ao Root diam: 3.60 cm Ao Asc diam:  3.90 cm MITRAL VALVE               TRICUSPID VALVE MV Area (PHT): 4.21 cm    TR Peak grad:   35.8 mmHg MV Decel Time: 180 msec    TR Vmax:        299.00 cm/s MV E velocity: 91.30 cm/s MV A velocity: 46.00 cm/s  SHUNTS MV E/A ratio:  1.98        Systemic VTI:  0.18 m                            Systemic Diam: 2.10 cm Lyman Bishop MD Electronically signed by Lyman Bishop MD Signature Date/Time: 11/20/2020/3:36:07 PM    Final     Microbiology: Recent Results (from the past 240 hour(s))  Resp Panel by RT-PCR (Flu A&B, Covid) Nasopharyngeal Swab     Status: None   Collection Time: 11/19/20  6:13 PM   Specimen: Nasopharyngeal Swab; Nasopharyngeal(NP) swabs in vial transport medium  Result Value Ref Range Status   SARS Coronavirus 2 by RT PCR NEGATIVE NEGATIVE Final    Comment: (NOTE) SARS-CoV-2 target nucleic acids are NOT DETECTED.  The SARS-CoV-2 RNA is generally detectable in upper respiratory specimens during the acute phase of infection. The lowest concentration of SARS-CoV-2 viral copies this assay can detect is 138 copies/mL. A negative result does not preclude SARS-Cov-2 infection and should not be used as the sole basis for treatment or other patient management decisions. A negative result may occur with  improper specimen collection/handling, submission of specimen  other than nasopharyngeal swab, presence of viral mutation(s) within the areas targeted by this assay, and inadequate number of viral copies(<138 copies/mL). A negative result must be combined with clinical observations, patient history, and epidemiological information. The expected result is Negative.  Fact Sheet for Patients:  EntrepreneurPulse.com.au  Fact Sheet for Healthcare Providers:  IncredibleEmployment.be  This test is no t yet approved or cleared by the [place] FDA and  has been authorized for detection and/or diagnosis of SARS-CoV-2 by FDA under an Emergency Use Authorization (EUA). This EUA will remain  in effect (meaning this test can be used) for the duration of the COVID-19 declaration under Section 564(b)(1) of the Act, 21 U.S.C.section 360bbb-3(b)(1), unless the authorization is terminated  or revoked sooner.       Influenza A by PCR NEGATIVE NEGATIVE Final   Influenza B by PCR NEGATIVE NEGATIVE Final    Comment: (NOTE) The Xpert Xpress SARS-CoV-2/FLU/RSV plus assay is intended as an aid in the diagnosis of influenza from Nasopharyngeal swab specimens and should not be used as a sole basis for treatment. Nasal washings and aspirates are unacceptable for Xpert Xpress SARS-CoV-2/FLU/RSV testing.  Fact Sheet for Patients: EntrepreneurPulse.com.au  Fact Sheet for Healthcare Providers: IncredibleEmployment.be  This test is not yet approved or cleared by the [place] FDA and has been authorized for detection and/or diagnosis of SARS-CoV-2 by FDA under an Emergency Use Authorization (EUA). This EUA will remain in effect (meaning this test can be used) for the duration of the COVID-19 declaration under Section 564(b)(1) of the Act, 21 U.S.C. section 360bbb-3(b)(1), unless the authorization is terminated or revoked.  Performed at [facility], [address]  [place], [place] [address]   Culture, Urine     Status: Abnormal   Collection  Time: 11/20/20 12:00 PM   Specimen: Urine, Random  Result Value Ref Range Status   Specimen Description URINE, RANDOM  Final   Special Requests NONE  Final   Culture (A)  Final    <10,000 COLONIES/mL INSIGNIFICANT GROWTH Performed at [facility], [address], [place], [place] [address]    Report Status 11/21/2020 FINAL  Final     Labs: Basic Metabolic Panel: Recent Labs  Lab 11/20/20 0817 11/21/20 0340 11/21/20 1610 11/22/20 0338 11/23/20 0219 11/24/20 1008  NA 135 133* 134* 135 136 135  K 3.4* 3.2* 3.9 3.8 3.7 3.9  CL 100 100 103 105 106 101  CO2 25 24 25 25 24 28   GLUCOSE 250* 222* 216* 210* 65* 236*  BUN 13 16 16 15 12 10   CREATININE 1.42* 1.60* 1.47* 1.43* 1.50* 1.46*  CALCIUM 8.9 8.4* 8.3* 8.2* 8.5* 9.1  MG 1.4*  --  1.8  --   --   --    Liver Function Tests: Recent Labs  Lab 11/19/20 1650 11/20/20 0817  AST 30 20  ALT 29 21  ALKPHOS 132* 100  BILITOT 1.0 1.0  PROT 7.3 5.6*  ALBUMIN 3.4* 2.6*   Recent Labs  Lab 11/19/20 1650  LIPASE 36   No results for input(s): AMMONIA in the last 168 hours. CBC: Recent Labs  Lab 11/20/20 0817 11/20/20 1740 11/21/20 0340 11/21/20 1657 11/22/20 0338 11/22/20 0746 11/23/20 0219  WBC 17.2*  --  11.2* 8.6 7.8  --  6.6  NEUTROABS 14.3*  --   --   --   --   --   --   HGB 9.6*   < > 9.0* 9.3* 8.5* 8.9* 8.8*  HCT 28.4*   < > 26.4* 27.5* 24.8* 26.4* 26.3*  MCV 92.5  --  92.6 92.9 93.6  --  93.3  PLT 349  --  365 388 354  --  336   < > = values in this interval not displayed.   Cardiac Enzymes: No results for input(s): CKTOTAL, CKMB, CKMBINDEX, TROPONINI in the last 168 hours. BNP: BNP (last 3 results) Recent Labs    11/19/20 1650  BNP 897.2*    ProBNP (last 3 results) No results for input(s): PROBNP in the last 8760 hours.  CBG: Recent Labs  Lab 11/23/20 1231 11/23/20 1829 11/23/20 2121 11/24/20 0809 11/24/20 1254   GLUCAP 148* 304* 271* 195* 259*       Signed:  Kayleen Memos, MD [facility] 11/24/2020, 3:26 PM

## 2020-11-24 NOTE — Progress Notes (Signed)
Progress Note  Patient Name: Phillip Oconnor Date of Encounter: 11/24/2020  Attending physician: Kayleen Memos, DO Primary care provider: Jenel Lucks, PA-C Primary Cardiologist: NA Consultant:Joelys Staubs Terri Skains, DO  Subjective: Phillip Oconnor is a 80 y.o. male who was seen and examined at bedside at approximately 245pm Was asked to reevaluate the patient due to elevated blood pressures and chest pain overnight. Yesterday night patient states that he had substernal discomfort, pleuritic in nature, nonexertional, did not improve with rest.  Nitropaste was added by hospitalist team. No EKGs or troponins were checked yesterday night. Spoke to RN in the morning and titrate blood pressure medications. At the time of evaluation patient denies chest pain. Case discussed and reviewed with his nurse.  Objective: Vital Signs in the last 24 hours: Temp:  [97.6 F (36.4 C)] 97.6 F (36.4 C) (04/10 0405) Pulse Rate:  [66-97] 66 (04/10 0405) Resp:  [17-20] 17 (04/10 0405) BP: (161-196)/(75-107) 180/90 (04/10 0405) SpO2:  [96 %-99 %] 99 % (04/10 0405) Weight:  [92.7 kg] 92.7 kg (04/10 0405)  Intake/Output:  Intake/Output Summary (Last 24 hours) at 11/24/2020 1502 Last data filed at 11/24/2020 0700 Gross per 24 hour  Intake 380 ml  Output 1250 ml  Net -870 ml    Net IO Since Admission: -3,642.99 mL [11/24/20 1502]  Weights:  Filed Weights   11/22/20 0615 11/23/20 0607 11/24/20 0405  Weight: 94.2 kg 93.6 kg 92.7 kg    Telemetry: Personally reviewed.  Atrial flutter, rate controlled  Physical examination: PHYSICAL EXAM: Vitals with BMI 11/24/2020 11/24/2020 11/23/2020  Height - - -  Weight 204 lbs 6 oz - -  BMI 29.47 - -  Systolic 654 650 354  Diastolic 90 82 75  Pulse 66 - 77    CONSTITUTIONAL: Well-developed and well-nourished. No acute distress.  SKIN: Skin is warm and dry. No rash noted. No cyanosis. No pallor. No jaundice HEAD: Normocephalic and atraumatic.  EYES: No  scleral icterus MOUTH/THROAT: Moist oral membranes.  NECK: No JVD present. No thyromegaly noted. No carotid bruits  LYMPHATIC: No visible cervical adenopathy.  CHEST Normal respiratory effort. No intercostal retractions. Loop recorder site is C/D/I LUNGS:Clear to auscultation bilaterally.No stridor. No wheezes. No rales.  CARDIOVASCULAR:Irregularly irregular, variable S1-S2, no murmurs rubs or gallops appreciated. ABDOMINAL:Soft, nontender, nondistended, positive bowel sounds in all 4 quadrants, no apparent ascites.  EXTREMITIES:Trace bilateralperipheral edemacompression stockings present. HEMATOLOGIC: No significant bruising NEUROLOGIC: Oriented to person, place, and time. Nonfocal. Normal muscle tone.  PSYCHIATRIC: Normal mood and affect. Normal behavior. Cooperative.  Lab Results: Hematology Recent Labs  Lab 11/21/20 1657 11/22/20 0338 11/22/20 0746 11/23/20 0219  WBC 8.6 7.8  --  6.6  RBC 2.96* 2.65*  --  2.82*  HGB 9.3* 8.5* 8.9* 8.8*  HCT 27.5* 24.8* 26.4* 26.3*  MCV 92.9 93.6  --  93.3  MCH 31.4 32.1  --  31.2  MCHC 33.8 34.3  --  33.5  RDW 13.2 13.2  --  13.0  PLT 388 354  --  336    Chemistry Recent Labs  Lab 11/19/20 1650 11/19/20 1652 11/20/20 0817 11/21/20 0340 11/22/20 0338 11/23/20 0219 11/24/20 1008  NA  --    < > 135   < > 135 136 135  K  --    < > 3.4*   < > 3.8 3.7 3.9  CL  --    < > 100   < > 105 106 101  CO2  --    < >  25   < > 25 24 28   GLUCOSE  --    < > 250*   < > 210* 65* 236*  BUN  --    < > 13   < > 15 12 10   CREATININE  --    < > 1.42*   < > 1.43* 1.50* 1.46*  CALCIUM  --    < > 8.9   < > 8.2* 8.5* 9.1  PROT 7.3  --  5.6*  --   --   --   --   ALBUMIN 3.4*  --  2.6*  --   --   --   --   AST 30  --  20  --   --   --   --   ALT 29  --  21  --   --   --   --   ALKPHOS 132*  --  100  --   --   --   --   BILITOT 1.0  --  1.0  --   --   --   --   GFRNONAA  --    < > 50*   < > 49* 46* 48*  ANIONGAP  --    < > 10   < > 5 6 6    < > =  values in this interval not displayed.     Cardiac Enzymes: Cardiac Panel (last 3 results) Recent Labs    11/24/20 1008 11/24/20 1217  TROPONINIHS 38* 35*    BNP (last 3 results) Recent Labs    11/19/20 1650  BNP 897.2*    ProBNP (last 3 results) No results for input(s): PROBNP in the last 8760 hours.   DDimer No results for input(s): DDIMER in the last 168 hours.   Hemoglobin A1c:  Lab Results  Component Value Date   HGBA1C 9.6 (H) 11/21/2020   MPG 228.82 11/21/2020    TSH  Recent Labs    11/19/20 1652  TSH 2.299    Lipid Panel No results found for: CHOL, TRIG, HDL, CHOLHDL, VLDL, LDLCALC, LDLDIRECT  Imaging: No results found.  Cardiac database: EKG: 11/19/2020: Atypical atrial flutter with variable conduction, 65 bpm, underlying right bundle branch block, 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  11/24/2020: Atrial flutter 4: 1 conduction, 66 bpm, left axis deviation, without underlying injury pattern.  Echocardiogram: 11/20/2020: 1. Left ventricular ejection fraction, by estimation, is 70 to 75%. The  left ventricle has hyperdynamic function. The left ventricle has no  regional wall motion abnormalities. There is mild left ventricular  hypertrophy. Left ventricular diastolic  parameters are indeterminate.  2. Right ventricular systolic function is normal. The right ventricular  size is normal. There is mildly elevated pulmonary artery systolic  pressure. The estimated right ventricular systolic pressure is 62.3 mmHg.  3. The mitral valve is grossly normal. Trivial mitral valve  regurgitation.  4. The aortic valve is tricuspid. Aortic valve regurgitation is not  visualized. Mild aortic valve sclerosis is present, with no evidence of  aortic valve stenosis.  5. Evidence of atrial level shunting detected by color flow Doppler.  Agitated saline contrast bubble study was positive with shunting observed  within 3-6 cardiac  cycles suggestive of interatrial shunt. There is a  small patent foramen ovale with  predominantly left to right shunting across the atrial septum.  Scheduled Meds: . apixaban  5 mg Oral BID  . atorvastatin  80 mg Oral Daily  . carvedilol  6.25 mg Oral BID WC  . cyanocobalamin  1,000 mcg Intramuscular Q30 days  . doxazosin  4 mg Oral Daily  . folic acid  1 mg Oral Daily  . hydrALAZINE  100 mg Oral TID  . insulin aspart  0-15 Units Subcutaneous TID WC  . insulin aspart  3 Units Subcutaneous TID WC  . insulin glargine  5 Units Subcutaneous Daily  . levETIRAcetam  500 mg Oral BID  . linagliptin  5 mg Oral Daily  . NIFEdipine  90 mg Oral Daily  . nitroGLYCERIN  1 inch Topical Q8H  . senna-docusate  2 tablet Oral Daily    Continuous Infusions:   PRN Meds: acetaminophen, hydrALAZINE, ondansetron (ZOFRAN) IV   IMPRESSION & RECOMMENDATIONS: Phillip Oconnor is a 80 y.o. male whose past medical history and cardiac risk factors include: Hypertension, hyperlipidemia, diabetes mellitus type 2, history of stroke (per imaging CT head without contrast 11/19/2020), atrial flutter.  Impression & Plan: Atypical atrial flutter: Rate controlled, transition Toprol-XL to carvedilol for better blood pressure and heart rate control Rhythm control: N/A Thromboembolic prophylaxis: Eliquis Telemetry personally reviewed as noted above.    Long-term oral anticoagulation: Indication: Atrial flutter. Discussed the risks, benefits and alternatives to oral anticoagulation.  Patient is agreeable to transitioning to oral anticoagulation given his CHA2DS2-VASc score.  CHA2DS2-VASc SCORE is6which correlates to 9.7% risk of stroke per year (Age, HTN, DM, hx of stroke). Reemphasize the risks, benefits, and alternatives to anticoagulation.  Patient verbalizes understanding.  His questions concerns addressed. HB trends: 11.6 > 9.6> 9.8> 9.0  > 8.5> 8.9 > 8.8 today.  FOBT negative Patient is aware of  medication profile and verbalized understanding with regards to risks, benefits, and alternatives to oral anticoagulation.  Chest pain: Not suggestive of anginal equivalent. EKG and troponins not checked yesterday night at the time of discomfort. Recommended obtaining an EKG as well as trending troponins.  EKG shows atrial flutter without underlying injury pattern and troponin levels are downtrending as compared to on admission. I suspect that the chest discomfort was most likely secondary to systolic blood pressures greater than 180 mmHg. No chest pain between the shoulder blades, no focal neurological deficits, good urine output.  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  Small PFO: Noted on a bubble study. Monitor for now.  Benign essential hypertension: Asked to reevaluate his blood pressure management by primary team. Changed Toprol-XL to carvedilol. Patient was restarted on hydralazine 100 mg p.o. 3 times daily yesterday (home medication) Will discontinue amlodipine and start home dose Procardia. ARB currently being held due to renal function.  Baseline creatinine unknown. Blood pressures have improved since titration of medications  Hyperlipidemia: continue statin therapy.  Non-insulin-dependent diabetes mellitus type 2:per primary team.   History of stroke(perimaging,but patient denies such history): Secondary prevention recommended.  Transition aspirin 325 mg to 81 mg p.o. daily.  Continue statin therapy.    Former smoker. Educated on the importance of continued smoking cessation.  Patient is requested to follow-up at the clinic 4 weeks.  Patient's questions and concerns were addressed to his satisfaction. He voices understanding of the instructions provided during this encounter.   This note was created using a voice recognition software as a result there may be grammatical errors inadvertently enclosed that do not  reflect the nature of this encounter. Every attempt is made to correct such errors.  Total time spent: 37 minutes.  Rex Kras, [place], [facility]  Pager: [phone] Office: [phone]  11/24/2020, 3:02 PM

## 2020-11-25 ENCOUNTER — Other Ambulatory Visit (HOSPITAL_COMMUNITY): Payer: Self-pay

## 2020-11-25 LAB — GLUCOSE, CAPILLARY
Glucose-Capillary: 194 mg/dL — ABNORMAL HIGH (ref 70–99)
Glucose-Capillary: 196 mg/dL — ABNORMAL HIGH (ref 70–99)

## 2020-11-25 MED ORDER — HYDRALAZINE HCL 50 MG PO TABS
50.0000 mg | ORAL_TABLET | Freq: Three times a day (TID) | ORAL | 0 refills | Status: DC
  Filled 2020-11-25: qty 90, 30d supply, fill #0

## 2020-11-25 MED ORDER — HYDRALAZINE HCL 50 MG PO TABS: 50.0000 mg | ORAL_TABLET | Freq: Three times a day (TID) | ORAL | 0 refills | Status: DC

## 2020-11-25 MED ORDER — CYANOCOBALAMIN 1000 MCG/ML IJ SOLN
1000.0000 ug | INTRAMUSCULAR | 0 refills | Status: DC
Start: 2020-12-22 — End: 2021-05-14
  Filled 2020-11-25: qty 1, 30d supply, fill #0

## 2020-11-25 MED ORDER — LEVETIRACETAM 500 MG PO TABS
500.0000 mg | ORAL_TABLET | Freq: Two times a day (BID) | ORAL | 0 refills | Status: AC
  Filled 2020-11-25: qty 180, 90d supply, fill #0

## 2020-11-25 MED ORDER — ACCU-CHEK SOFTCLIX LANCETS MISC
5 refills | Status: AC
  Filled 2020-11-25: qty 100, fill #0

## 2020-11-25 MED ORDER — ASPIRIN 81 MG PO CHEW: 81.0000 mg | CHEWABLE_TABLET | Freq: Every day | ORAL | 0 refills | Status: DC

## 2020-11-25 MED ORDER — LINAGLIPTIN 5 MG PO TABS
5.0000 mg | ORAL_TABLET | Freq: Every day | ORAL | 0 refills | Status: DC
  Filled 2020-11-25: qty 90, 90d supply, fill #0
  Filled 2020-11-25: qty 30, 30d supply, fill #0

## 2020-11-25 MED ORDER — FOLIC ACID 1 MG PO TABS
1.0000 mg | ORAL_TABLET | Freq: Every day | ORAL | 0 refills | Status: AC
  Filled 2020-11-25: qty 90, 90d supply, fill #0

## 2020-11-25 MED ORDER — ATORVASTATIN CALCIUM 80 MG PO TABS
80.0000 mg | ORAL_TABLET | Freq: Every day | ORAL | 0 refills | Status: AC
  Filled 2020-11-25: qty 90, 90d supply, fill #0

## 2020-11-25 MED ORDER — GLUCOSE BLOOD VI STRP
ORAL_STRIP | 12 refills | Status: AC
Start: 2020-11-25 — End: ?
  Filled 2020-11-25: qty 100, fill #0

## 2020-11-25 MED ORDER — PENTIPS 32G X 4 MM MISC
100.0000 | Freq: Three times a day (TID) | 0 refills | Status: DC
  Filled 2020-11-25: qty 100, 30d supply, fill #0

## 2020-11-25 MED ORDER — INSULIN GLARGINE 100 UNIT/ML SOLOSTAR PEN
5.0000 [IU] | PEN_INJECTOR | Freq: Every day | SUBCUTANEOUS | 0 refills | Status: DC
  Filled 2020-11-25: qty 15, 300d supply, fill #0

## 2020-11-25 MED ORDER — CARVEDILOL 3.125 MG PO TABS
3.1250 mg | ORAL_TABLET | Freq: Two times a day (BID) | ORAL | 0 refills | Status: DC
Start: 2020-11-25 — End: 2021-05-14
  Filled 2020-11-25: qty 180, 90d supply, fill #0

## 2020-11-25 MED ORDER — BLOOD GLUCOSE METER KIT
PACK | 0 refills | Status: DC
  Filled 2020-11-25: qty 1, fill #0

## 2020-11-25 MED ORDER — CARVEDILOL 3.125 MG PO TABS
3.1250 mg | ORAL_TABLET | Freq: Two times a day (BID) | ORAL | 0 refills | Status: DC
Start: 2020-11-25 — End: 2020-11-25

## 2020-11-25 MED ORDER — APIXABAN 5 MG PO TABS
5.0000 mg | ORAL_TABLET | Freq: Two times a day (BID) | ORAL | 3 refills | Status: DC
  Filled 2020-11-25: qty 90, 45d supply, fill #0

## 2020-11-25 MED ORDER — INSULIN ASPART 100 UNIT/ML FLEXPEN
3.0000 [IU] | PEN_INJECTOR | Freq: Three times a day (TID) | SUBCUTANEOUS | 0 refills | Status: DC
  Filled 2020-11-25: qty 15, 167d supply, fill #0

## 2020-11-25 NOTE — Care Management Important Message (Signed)
Important Message  Patient Details  Name: Phillip Oconnor MRN: [id number] Date of Birth: 03-07-1941   Medicare Important Message Given:  Yes     Shelda Altes 11/25/2020, 10:10 AM

## 2020-11-25 NOTE — Progress Notes (Addendum)
Inpatient Diabetes Program Recommendations  AACE/ADA: New Consensus Statement on Inpatient Glycemic Control (2015)  Target Ranges:  Prepandial:   less than 140 mg/dL      Peak postprandial:   less than 180 mg/dL (1-2 hours)      Critically ill patients:  140 - 180 mg/dL   Lab Results  Component Value Date   GLUCAP 194 (H) 11/25/2020   HGBA1C 9.6 (H) 11/21/2020    Review of Glycemic Control Results for Phillip Oconnor, Phillip Oconnor (MRN [id number]) as of 11/25/2020 09:15  Ref. Range 11/24/2020 08:09 11/24/2020 12:54 11/24/2020 16:15 11/24/2020 19:42 11/24/2020 21:08 11/25/2020 08:15  Glucose-Capillary Latest Ref Range: 70 - 99 mg/dL 195 (H)  Novolog 6 units  Lantus 5 units  Tradjenta 5 mg  259 (H)  Novolog 11 units 206 (H)  Novolog 8 units 74 86 194 (H)    Diabetes history: DM 2 Outpatient Diabetes medications: metformin 500 mg bid, Tradjenta 5 mg Daily Current orders for Inpatient glycemic control:  Lantus 5 units Novolog 0-15 units tid Tradjenta 5 mg Daily Novolog 3 units tid meal coverage  A1c elevated at 9.6% this admission  Inpatient Diabetes Program Recommendations:    Glucose came down after larger Novolog doses if pt remains in hospital consider:  -  Increase Lantus to 5 units bid -  Reduce Novolog 0-9 units tid (elevated renal function)   Note pt maybe d/c'd. Follow up with PCP asap.  Thanks,  Tama Headings RN, MSN, BC-ADM Inpatient Diabetes Coordinator Team Pager [phone] (8a-5p)

## 2020-11-25 NOTE — Progress Notes (Addendum)
Progress Note  Patient Name: Phillip Oconnor Date of Encounter: 11/25/2020  Attending physician: Kayleen Memos, DO Primary care provider: Jenel Lucks, PA-C Primary Cardiologist: NA Consultant:[name] Grandt Royston Sinner, PA-C  Subjective: Phillip Oconnor is a 80 y.o. male who was seen and examined at bedside at approximately 7:45 AM Patient's blood pressure control has improved, with BP 128/64 mmHg at time of exam.  Repeat EKG yesterday revealed patient remains in atrial flutter well rate controlled. Serial troponin have trended down since admission.  Patient has had no recurrence of chest pain since Saturday night.   Objective: Vital Signs in the last 24 hours: Temp:  [97.6 F (36.4 C)] 97.6 F (36.4 C) (04/11 0551) Pulse Rate:  [65-71] 71 (04/11 0551) Resp:  [16-18] 18 (04/11 0551) BP: (112-166)/(49-80) 125/49 (04/11 0551) SpO2:  [93 %-98 %] 93 % (04/11 0551) Weight:  [93.4 kg] 93.4 kg (04/11 0551)  Intake/Output:  Intake/Output Summary (Last 24 hours) at 11/25/2020 0745 Last data filed at 11/25/2020 0600 Gross per 24 hour  Intake --  Output 550 ml  Net -550 ml    Net IO Since Admission: -4,192.99 mL [11/25/20 0745]  Weights:  Filed Weights   11/23/20 0607 11/24/20 0405 11/25/20 0551  Weight: 93.6 kg 92.7 kg 93.4 kg    Telemetry: Personally reviewed.  Atrial flutter, rate controlled  Physical examination: PHYSICAL EXAM: Vitals with BMI 11/25/2020 11/24/2020 11/24/2020  Height - - -  Weight 205 lbs 14 oz - -  BMI 36.62 - -  Systolic 947 654 650  Diastolic 49 61 55  Pulse 71 67 -    CONSTITUTIONAL: Well-developed and well-nourished. No acute distress.  SKIN: Skin is warm and dry. No rash noted. No cyanosis. No pallor. No jaundice HEAD: Normocephalic and atraumatic.  EYES: No scleral icterus MOUTH/THROAT: Moist oral membranes.  NECK: No JVD present. No thyromegaly noted. No carotid bruits  LYMPHATIC: No visible cervical adenopathy.  CHEST Normal  respiratory effort. No intercostal retractions. Loop recorder site is C/D/I LUNGS:Clear to auscultation bilaterally.No stridor. No wheezes. No rales.  CARDIOVASCULAR:Irregularly irregular, variable S1-S2, no murmurs rubs or gallops appreciated. ABDOMINAL:Soft, nontender, nondistended, positive bowel sounds in all 4 quadrants, no apparent ascites.  EXTREMITIES:1+ pitting bilateralperipheral edema,compression stockings present. HEMATOLOGIC: No significant bruising NEUROLOGIC: Oriented to person, place, and time. Nonfocal. Normal muscle tone.  PSYCHIATRIC: Normal mood and affect. Normal behavior. Cooperative.  Lab Results: Hematology Recent Labs  Lab 11/21/20 1657 11/22/20 0338 11/22/20 0746 11/23/20 0219  WBC 8.6 7.8  --  6.6  RBC 2.96* 2.65*  --  2.82*  HGB 9.3* 8.5* 8.9* 8.8*  HCT 27.5* 24.8* 26.4* 26.3*  MCV 92.9 93.6  --  93.3  MCH 31.4 32.1  --  31.2  MCHC 33.8 34.3  --  33.5  RDW 13.2 13.2  --  13.0  PLT 388 354  --  336    Chemistry Recent Labs  Lab 11/19/20 1650 11/19/20 1652 11/20/20 0817 11/21/20 0340 11/22/20 0338 11/23/20 0219 11/24/20 1008  NA  --    < > 135   < > 135 136 135  K  --    < > 3.4*   < > 3.8 3.7 3.9  CL  --    < > 100   < > 105 106 101  CO2  --    < > 25   < > 25 24 28   GLUCOSE  --    < > 250*   < > 210* 65*  236*  BUN  --    < > 13   < > 15 12 10   CREATININE  --    < > 1.42*   < > 1.43* 1.50* 1.46*  CALCIUM  --    < > 8.9   < > 8.2* 8.5* 9.1  PROT 7.3  --  5.6*  --   --   --   --   ALBUMIN 3.4*  --  2.6*  --   --   --   --   AST 30  --  20  --   --   --   --   ALT 29  --  21  --   --   --   --   ALKPHOS 132*  --  100  --   --   --   --   BILITOT 1.0  --  1.0  --   --   --   --   GFRNONAA  --    < > 50*   < > 49* 46* 48*  ANIONGAP  --    < > 10   < > 5 6 6    < > = values in this interval not displayed.     Cardiac Enzymes: Cardiac Panel (last 3 results) Recent Labs    11/24/20 1008 11/24/20 1217  TROPONINIHS 38* 35*     BNP (last 3 results) Recent Labs    11/19/20 1650  BNP 897.2*    ProBNP (last 3 results) No results for input(s): PROBNP in the last 8760 hours.   DDimer No results for input(s): DDIMER in the last 168 hours.   Hemoglobin A1c:  Lab Results  Component Value Date   HGBA1C 9.6 (H) 11/21/2020   MPG 228.82 11/21/2020    TSH  Recent Labs    11/19/20 1652  TSH 2.299    Lipid Panel No results found for: CHOL, TRIG, HDL, CHOLHDL, VLDL, LDLCALC, LDLDIRECT  Imaging: No results found.  Cardiac database: EKG: 11/19/2020: Atypical atrial flutter with variable conduction, 65 bpm, underlying right bundle branch block, 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  11/24/2020: Atrial flutter 4: 1 conduction, 66 bpm, left axis deviation, without underlying injury pattern.  Echocardiogram: 11/20/2020: 1. Left ventricular ejection fraction, by estimation, is 70 to 75%. The  left ventricle has hyperdynamic function. The left ventricle has no  regional wall motion abnormalities. There is mild left ventricular  hypertrophy. Left ventricular diastolic  parameters are indeterminate.  2. Right ventricular systolic function is normal. The right ventricular  size is normal. There is mildly elevated pulmonary artery systolic  pressure. The estimated right ventricular systolic pressure is 62.8 mmHg.  3. The mitral valve is grossly normal. Trivial mitral valve  regurgitation.  4. The aortic valve is tricuspid. Aortic valve regurgitation is not  visualized. Mild aortic valve sclerosis is present, with no evidence of  aortic valve stenosis.  5. Evidence of atrial level shunting detected by color flow Doppler.  Agitated saline contrast bubble study was positive with shunting observed  within 3-6 cardiac cycles suggestive of interatrial shunt. There is a  small patent foramen ovale with  predominantly left to right shunting across the atrial septum.  Scheduled  Meds: . apixaban  5 mg Oral BID  . aspirin  81 mg Oral Daily  . atorvastatin  80 mg Oral Daily  . carvedilol  3.125 mg Oral BID WC  . cyanocobalamin  1,000 mcg Intramuscular Q30 days  .  doxazosin  4 mg Oral Daily  . folic acid  1 mg Oral Daily  . hydrALAZINE  50 mg Oral TID  . insulin aspart  0-15 Units Subcutaneous TID WC  . insulin aspart  3 Units Subcutaneous TID WC  . insulin glargine  5 Units Subcutaneous Daily  . levETIRAcetam  500 mg Oral BID  . linagliptin  5 mg Oral Daily  . NIFEdipine  90 mg Oral Daily  . senna-docusate  2 tablet Oral Daily    Continuous Infusions:   PRN Meds: acetaminophen, hydrALAZINE, ondansetron (ZOFRAN) IV   IMPRESSION & RECOMMENDATIONS: EMILO GRAS is a 80 y.o. male whose past medical history and cardiac risk factors include: Hypertension, hyperlipidemia, diabetes mellitus type 2, history of stroke (per imaging CT head without contrast 11/19/2020), atrial flutter.  Impression & Plan: Atypical atrial flutter: Well rate controlled. Continue carvedilol.  Rhythm control: N/A  Thromboembolic prophylaxis: Eliquis  I personally reviewed telemetry - rate controlled atrial flutter.   Long-term oral anticoagulation: Indication: Atrial flutter. CHA2DS2-VASc SCORE is6which correlates to 9.7% risk of stroke per year (Age, HTN, DM, hx of stroke). Risks and benefits of anticoagulation have been reviewed and patient verbalized understanding. Continue Eliquis.  HB trends: 11.6 > 9.6> 9.8> 9.0  > 8.5> 8.9 > 8.8  FOBT negative  Chest pain: Patient has had no recurrence of chest pain since Saturday night. Repeat EKG yesterday showed rate controlled atrial flutter without underlying injury pattern.  Patient's troponin level has continued to trend down since admission.  Patient symptoms of chest pain over the weekend were likely secondary to elevated blood pressure, which is now well controlled. Continue current antihypertensive  medications.  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  Patient not presently on ARB for ACE inhibitor due to renal function with creatinine of 1.46 and unknown baseline creatinine.  Small PFO: Noted on a bubble study. Monitor for now.  Benign essential hypertension: Blood pressure is not well controlled. Continue carvedilol 3.125 mg twice daily, hydralazine 50 mg 3 times daily, nifedipine 90 mg daily. Continue to hold ARB/ACE inhibitor due to renal function as baseline creatinine is unknown.  Hyperlipidemia: continue statin therapy (atorvastatin).  Non-insulin-dependent diabetes mellitus type 2:per primary team.   History of stroke(perimaging,but patient denies such history):  Secondary prevention recommended.  Continue aspirin 81 mg daily and atorvastatin 80 mg daily.    Recommend outpatient follow-up in 4 weeks.   Patient was seen in collaboration with Dr. Terri Skains.   Alethia Berthold, PA-C 11/25/2020, 9:46 AM Office: [phone]

## 2020-11-25 NOTE — Discharge Summary (Signed)
PROGRESS NOTE  Phillip Oconnor:[id number] DOB: Jun 16, 1941  PCP: Phillip Lucks, PA-C  Admit date: 11/19/2020 Discharge date: 11/25/2020  Time spent: 35 minutes.  Recommendations for Outpatient Follow-up:  1. Follow-up with your cardiologist in 1 to 2 weeks. 2. Follow-up with your primary care provider within a week. 3. Take your medications as prescribed. 4. Continue seizure precautions.  Discharge Diagnoses:  Active Hospital Problems   Diagnosis Date Noted  . Atrial flutter, paroxysmal ([place]) 11/20/2020  . 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 Precordial pain   . Atherosclerosis of native coronary artery of native heart without angina pectoris   . Hx of heart artery stent   . Long term (current) use of anticoagulants   . DM2 (diabetes mellitus, type 2) ([place]) 11/20/2020  . HTN (hypertension) 11/20/2020  . History of multiple strokes 11/20/2020  . Seizure ([place]) 11/20/2020    Resolved Hospital Problems  No resolved problems to display.    Discharge Condition: Stable.  Diet recommendation: Heart healthy carb modified diet.  Vitals:   11/25/20 0817 11/25/20 1157  BP: 126/67 134/62  Pulse: 70 74  Resp: 16 18  Temp: 97.9 F (36.6 C) 98.4 F (36.9 C)  SpO2: 97% 98%    History of present illness:  Phillip Oconnor a 80 y.o.malewith medical history significant ofDM2, HTN, HLD.  Presented to [facility] ED after receiving a call from his provider that his loop recorder was showing A. fib and that he needed to come to the hospital. He drove himself to [facility]. He had recently been treated for urinary tract infection, completed 10-day course of oral antibiotics on Saturday, 11/17/2020, was otherwise in hisusual state of health.  Upon presentation to the ED, twelve-lead EKG showed a flutter, BNP elevated 800, small bilateral pleural effusions on chest x-ray.   Was started on heparin drip and received 1 dose of IV Lasix 40 mg.  Cardiology was consulted by EDP. Hospitalist team asked to admit.  Hospital course complicated by acute drop in hemoglobin of unclear etiology.  He had an anemia work-up.  FOBT was negative, no melena or hematochezia, there were no overt bleeding and no evidence of iron deficiency.  Vitamin N56 and folic acid levels were borderline.  He was started on folic acid and vitamin B12 supplements.  Curb sided with GI, Dr. Rayne Du, thought this was less likely GI related anemia, suspected anemia of chronic disease.  GI will follow the patient in the clinic.  Had one episode of chest discomfort on 11/24/2020 which was thought to be secondary to uncontrolled hypertension with SBP greater than 180.  Seen by cardiology, cardiac medications were adjusted.  His chest discomfort had resolved.  He has no new complaints.  BP is at goal.  11/25/20: [name]  He has no new complaints.  Denies having any anginal symptoms.  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   2D echo  done on 11/20/2020 showed LVEF 70 to 75%, no regional wall motion abnormalities.  Positive bubble study.  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 Follow-up with cardiology outpatient.  Resolved chest pain likely secondary to uncontrolled hypertension Troponin peaked at 38 and trended down. A-flutter and prolonged QTC on 12 EKG, no  evidence of acute ischemia. Cardiac medications adjusted by cardiology Denies having any anginal symptoms at the time of this visit.  Essential hypertension Cardiology recommended: Coreg 3.125 mg twice daily, p.o. hydralazine 50 mg 3 times daily, nifedipine 90 mg daily. Continue to hold off ARB/ACE inhibitor due to renal function.  Follow-up with cardiology or your PCP.  Anemia of chronic disease Acute drop in hemoglobin FOBT negative on 11/22/2020 Iron studies with no evidence of iron deficiency. T73 and folic acid level borderline Continue U20 and folic acid replacement. Curb sided with GI on 11/22/2020, Dr. Rayne Du.  Per GI, this is less likely GI related anemia, suspects anemia of chronic disease.   GI will follow in the clinic.  Prolonged QTC Admission EKG revealed QTC greater than 500 Repeated twelve-lead EKG done on 11/24/2020 showed QTC 555. Potassium 3.9. Magnesium 1.8. Follow-up with cardiology in 1 to 2 weeks.  Type 2 diabetes with hyperglycemia Hemoglobin A1c 9.6 on 11/21/2020. Hold off home metformin due to renal function.   Continue Lantus 5 units daily, NovoLog 3 units 3 times daily, and home Tradjenta. Follow-up with your PCP within a week.  CKD 3A At his baseline Cr 1. 46 with GFR 48 Avoid nephrotoxic agents Follow up with your PCP  History of seizure disorder Continue home Keppra Continue seizure precautions  Hyperlipidemia Continue home Lipitor  BPH Continue home Cardura Follow-up with your PCP.  History of CVA Continue Lipitor 80 mg daily Continue Eliquis for secondary CVA prevention PT OT assessed with no further recommendations.  Small PFO noted on 2D echo with bubble study. Follow-up with cardiology.    Code Status: Full code.   Consultants:  Cardiology.  Procedures:  2D echo.  Antimicrobials:  None.  DVT prophylaxis: Eliquis   Discharge Exam: BP 134/62 (BP Location: Right Arm)   Pulse 74   Temp 98.4 F  (36.9 C) (Oral)   Resp 18   Ht 6' 4"  (1.93 m)   Wt 93.4 kg   SpO2 98%   BMI 25.06 kg/m  . General: 80 y.o. year-old male frail-appearing no acute distress.  He is alert oriented x3.   . Cardiovascular: Regular rate and rhythm no rubs or gallops. Marland Kitchen Respiratory: Clear to auscultation no wheezes or rales.   . Abdomen: Soft nontender Normal Bowel Sounds Present.  . Musculoskeletal: No lower extremity edema bilaterally. . Skin: No ulcerative lesions noted.   Marland Kitchen Psychiatry: Mood is appropriate for condition and setting.  Discharge Instructions You were cared for by a hospitalist during your hospital stay. If you have any questions about your discharge medications or the care you received while you were in the hospital after you are discharged, you can call the unit and asked to speak with the hospitalist on call if the hospitalist that took care of you is not available. Once you are discharged, your primary care physician will handle any further medical issues. Please note that NO REFILLS for any discharge medications will be authorized once you are discharged, as it is imperative that you return to your primary care physician (or establish a relationship with a primary care physician if  you do not have one) for your aftercare needs so that they can reassess your need for medications and monitor your lab values.   Allergies as of 11/25/2020      Reactions   Penicillins Rash, Swelling   Lips swell   Plavix [clopidogrel] Palpitations      Medication List    STOP taking these medications   losartan 100 MG tablet Commonly known as: COZAAR   metFORMIN 500 MG tablet Commonly known as: GLUCOPHAGE     TAKE these medications   Accu-Chek Softclix Lancets lancets Use as directed up to 6 times daily.   apixaban 5 MG Tabs tablet Commonly known as: ELIQUIS Take 1 tablet (5 mg total) by mouth 2 (two) times daily.   atorvastatin 80 MG tablet Commonly known as: LIPITOR Take 1 tablet (80 mg  total) by mouth daily. What changed:   medication strength  how much to take   blood glucose meter kit and supplies Dispense based on patient and insurance preference. Use up to four times daily as directed. (FOR ICD-10 E10.9, E11.9).   carvedilol 3.125 MG tablet Commonly known as: COREG Take 1 tablet (3.125 mg total) by mouth 2 (two) times daily with a meal.   cyanocobalamin 1000 MCG/ML injection Commonly known as: (VITAMIN B-12) Inject 1 mL (1,000 mcg total) into the muscle every 30 (thirty) days. Start taking on: Dec 22, 2020   doxazosin 4 MG tablet Commonly known as: CARDURA Take 4 mg by mouth daily.   folic acid 1 MG tablet Commonly known as: FOLVITE Take 1 tablet (1 mg total) by mouth daily.   glucose blood test strip Use as instructed   hydrALAZINE 50 MG tablet Commonly known as: APRESOLINE Take 1 tablet (50 mg total) by mouth 3 (three) times daily. What changed:   medication strength  how much to take   insulin aspart 100 UNIT/ML FlexPen Commonly known as: NOVOLOG Inject 3 Units into the skin 3 (three) times daily with meals.   insulin glargine 100 UNIT/ML Solostar Pen Commonly known as: LANTUS Inject 5 Units into the skin daily.   levETIRAcetam 500 MG tablet Commonly known as: KEPPRA Take 1 tablet (500 mg total) by mouth 2 (two) times daily. What changed: when to take this   linagliptin 5 MG Tabs tablet Commonly known as: TRADJENTA Take 1 tablet (5 mg total) by mouth daily.   NIFEdipine 90 MG 24 hr tablet Commonly known as: PROCARDIA XL/NIFEDICAL-XL Take 90 mg by mouth daily.   PenTips 32G X 4 MM Misc Generic drug: Insulin Pen Needle 100 each by Does not apply route 4 (four) times daily - after meals and at bedtime.      Allergies  Allergen Reactions  . Penicillins Rash and Swelling    Lips swell  . Plavix [Clopidogrel] Palpitations    Follow-up Information    Phillip Lucks, PA-C. Call in 1 day(s).   Specialty: Internal  Medicine Why: please call for a post hospital follow up appointment Contact information: [place] [address] [phone]        Rex Kras, DO. Schedule an appointment as soon as possible for a visit in 4 week(s).   Specialties: Cardiology, Radiology, Vascular Surgery Why: please call for a post hospital follow up appointment Contact information: [place] [address] [phone]        Mauri Pole, MD. Call in 1 day(s).   Specialty: Gastroenterology Why: please call for a  post hospital follow up appointment Contact information: [place] [id number] [phone]                The results of significant diagnostics from this hospitalization (including imaging, microbiology, ancillary and laboratory) are listed below for reference.    Significant Diagnostic Studies: DG Chest 2 View  Result Date: 11/19/2020 CLINICAL DATA:  Atrial fibrillation. EXAM: CHEST - 2 VIEW COMPARISON:  None. FINDINGS: The heart size and mediastinal contours are within normal limits. No pneumothorax is noted. Small bilateral pleural effusions are noted. No consolidative process is noted. The visualized skeletal structures are unremarkable. IMPRESSION: Small bilateral pleural effusions. Electronically Signed   By: Marijo Conception M.D.   On: 11/19/2020 18:13   CT Head Wo Contrast  Result Date: 11/19/2020 CLINICAL DATA:  Stroke follow-up EXAM: CT HEAD WITHOUT CONTRAST TECHNIQUE: Contiguous axial images were obtained from the base of the skull through the vertex without intravenous contrast. COMPARISON:  None. FINDINGS: Brain: Mild atrophy. Negative for hydrocephalus. Chronic infarct right frontal lobe. Hypodensity in the frontal white matter bilaterally right greater than left. Negative for acute cortical infarct, hemorrhage, mass. Vascular: Negative for hyperdense vessel Skull: Negative Sinuses/Orbits: Mucoperiosteal thickening  right maxillary sinus. Negative orbit Other: None IMPRESSION: No acute abnormality.  Chronic infarct right frontal lobe. Electronically Signed   By: Franchot Gallo M.D.   On: 11/19/2020 20:25   ECHOCARDIOGRAM COMPLETE  Result Date: 11/20/2020    ECHOCARDIOGRAM REPORT   Patient Name:   TRAVON CROCHET Date of Exam: 11/20/2020 Medical Rec #:  [id number]      Height:       76.0 in Accession #:    [id number]     Weight:       205.7 lb Date of Birth:  [birth year]      BSA:          2.242 m Patient Age:    89 years       BP:           140/56 mmHg Patient Gender: M              HR:           66 bpm. Exam Location:  Inpatient Procedure: 2D Echo, Cardiac Doppler and Color Doppler Indications:    Atrial fibrillation  History:        Patient has no prior history of Echocardiogram examinations.                 Stroke; Risk Factors:Hypertension, Diabetes and hyperlipidemia.  Sonographer:    Luisa Hart RDCS Referring Phys: 62 JARED M GARDNER  Sonographer Comments: Technically difficult study due to poor echo windows. IMPRESSIONS  1. Left ventricular ejection fraction, by estimation, is 70 to 75%. The left ventricle has hyperdynamic function. The left ventricle has no regional wall motion abnormalities. There is mild left ventricular hypertrophy. Left ventricular diastolic parameters are indeterminate.  2. Right ventricular systolic function is normal. The right ventricular size is normal. There is mildly elevated pulmonary artery systolic pressure. The estimated right ventricular systolic pressure is 71.0 mmHg.  3. The mitral valve is grossly normal. Trivial mitral valve regurgitation.  4. The aortic valve is tricuspid. Aortic valve regurgitation is not visualized. Mild aortic valve sclerosis is present, with no evidence of aortic valve stenosis.  5. Evidence of atrial level shunting detected by color flow Doppler. Agitated saline contrast bubble study was positive with shunting observed within 3-6 cardiac cycles suggestive  of  interatrial shunt. 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 Comparison(s): No prior Echocardiogram. FINDINGS  Left Ventricle: Left ventricular ejection fraction, by estimation, is 70 to 75%. The left ventricle has hyperdynamic function. The left ventricle has no regional wall motion abnormalities. The left ventricular internal cavity size was normal in size. There is mild left ventricular hypertrophy. Left ventricular diastolic function could not be evaluated due to atrial fibrillation. Left ventricular diastolic parameters are indeterminate. Right Ventricle: The right ventricular size is normal. No increase in right ventricular wall thickness. Right ventricular systolic function is normal. There is mildly elevated pulmonary artery systolic pressure. The tricuspid regurgitant velocity is 2.99  m/s, and with an assumed right atrial pressure of 3 mmHg, the estimated right ventricular systolic pressure is 11.5 mmHg. Left Atrium: Left atrial size was normal in size. Right Atrium: Right atrial size was normal in size. Pericardium: There is no evidence of pericardial effusion. Mitral Valve: The mitral valve is grossly normal. Trivial mitral valve regurgitation. Tricuspid Valve: The tricuspid valve is grossly normal. Tricuspid valve regurgitation is trivial. Aortic Valve: The aortic valve is tricuspid. Aortic valve regurgitation is not visualized. Mild aortic valve sclerosis is present, with no evidence of aortic valve stenosis. Aortic valve mean gradient measures 3.0 mmHg. Aortic valve peak gradient measures 5.8 mmHg. Aortic valve area, by VTI measures 2.49 cm. Pulmonic Valve: The pulmonic valve was grossly normal. Pulmonic valve regurgitation is trivial. Aorta: The aortic root and ascending aorta are structurally normal, with no evidence of dilitation. IAS/Shunts: The interatrial septum is aneurysmal. Evidence of atrial level shunting detected by color  flow Doppler. Agitated saline contrast bubble study was positive with shunting observed within 3-6 cardiac cycles suggestive of interatrial shunt. A small patent foramen ovale is detected with predominantly left to right shunting across the atrial septum.  LEFT VENTRICLE PLAX 2D LVIDd:         4.90 cm     Diastology LVIDs:         3.00 cm     LV e' medial:    9.37 cm/s LV PW:         1.10 cm     LV E/e' medial:  9.7 LV IVS:        1.40 cm     LV e' lateral:   12.60 cm/s LVOT diam:     2.10 cm     LV E/e' lateral: 7.2 LV SV:         63 LV SV Index:   28 LVOT Area:     3.46 cm  LV Volumes (MOD) LV vol d, MOD A2C: 68.6 ml LV vol d, MOD A4C: 56.8 ml LV vol s, MOD A2C: 19.1 ml LV vol s, MOD A4C: 13.3 ml LV SV MOD A2C:     49.5 ml LV SV MOD A4C:     56.8 ml LV SV MOD BP:      47.0 ml RIGHT VENTRICLE RV S prime:     18.50 cm/s TAPSE (M-mode): 2.4 cm LEFT ATRIUM           Index LA diam:      3.60 cm 1.61 cm/m LA Vol (A4C): 37.1 ml 16.55 ml/m  AORTIC VALVE                   PULMONIC VALVE AV Area (Vmax):    2.56 cm    PV Vmax:  1.56 m/s AV Area (Vmean):   2.35 cm    PV Vmean:      101.000 cm/s AV Area (VTI):     2.49 cm    PV VTI:        0.271 m AV Vmax:           120.00 cm/s PV Peak grad:  9.7 mmHg AV Vmean:          85.900 cm/s PV Mean grad:  5.0 mmHg AV VTI:            0.253 m AV Peak Grad:      5.8 mmHg AV Mean Grad:      3.0 mmHg LVOT Vmax:         88.60 cm/s LVOT Vmean:        58.200 cm/s LVOT VTI:          0.182 m LVOT/AV VTI ratio: 0.72  AORTA Ao Root diam: 3.60 cm Ao Asc diam:  3.90 cm MITRAL VALVE               TRICUSPID VALVE MV Area (PHT): 4.21 cm    TR Peak grad:   35.8 mmHg MV Decel Time: 180 msec    TR Vmax:        299.00 cm/s MV E velocity: 91.30 cm/s MV A velocity: 46.00 cm/s  SHUNTS MV E/A ratio:  1.98        Systemic VTI:  0.18 m                            Systemic Diam: 2.10 cm Lyman Bishop MD Electronically signed by Lyman Bishop MD Signature Date/Time: 11/20/2020/3:36:07 PM    Final      Microbiology: Recent Results (from the past 240 hour(s))  Resp Panel by RT-PCR (Flu A&B, Covid) Nasopharyngeal Swab     Status: None   Collection Time: 11/19/20  6:13 PM   Specimen: Nasopharyngeal Swab; Nasopharyngeal(NP) swabs in vial transport medium  Result Value Ref Range Status   SARS Coronavirus 2 by RT PCR NEGATIVE NEGATIVE Final    Comment: (NOTE) SARS-CoV-2 target nucleic acids are NOT DETECTED.  The SARS-CoV-2 RNA is generally detectable in upper respiratory specimens during the acute phase of infection. The lowest concentration of SARS-CoV-2 viral copies this assay can detect is 138 copies/mL. A negative result does not preclude SARS-Cov-2 infection and should not be used as the sole basis for treatment or other patient management decisions. A negative result may occur with  improper specimen collection/handling, submission of specimen other than nasopharyngeal swab, presence of viral mutation(s) within the areas targeted by this assay, and inadequate number of viral copies(<138 copies/mL). A negative result must be combined with clinical observations, patient history, and epidemiological information. The expected result is Negative.  Fact Sheet for Patients:  EntrepreneurPulse.com.au  Fact Sheet for Healthcare Providers:  IncredibleEmployment.be  This test is no t yet approved or cleared by the [place] FDA and  has been authorized for detection and/or diagnosis of SARS-CoV-2 by FDA under an Emergency Use Authorization (EUA). This EUA will remain  in effect (meaning this test can be used) for the duration of the COVID-19 declaration under Section 564(b)(1) of the Act, 21 U.S.C.section 360bbb-3(b)(1), unless the authorization is terminated  or revoked sooner.       Influenza A by PCR NEGATIVE NEGATIVE Final   Influenza B by PCR NEGATIVE NEGATIVE Final    Comment: (NOTE)  The Xpert Xpress SARS-CoV-2/FLU/RSV plus assay is  intended as an aid in the diagnosis of influenza from Nasopharyngeal swab specimens and should not be used as a sole basis for treatment. Nasal washings and aspirates are unacceptable for Xpert Xpress SARS-CoV-2/FLU/RSV testing.  Fact Sheet for Patients: EntrepreneurPulse.com.au  Fact Sheet for Healthcare Providers: IncredibleEmployment.be  This test is not yet approved or cleared by the [place] FDA and has been authorized for detection and/or diagnosis of SARS-CoV-2 by FDA under an Emergency Use Authorization (EUA). This EUA will remain in effect (meaning this test can be used) for the duration of the COVID-19 declaration under Section 564(b)(1) of the Act, 21 U.S.C. section 360bbb-3(b)(1), unless the authorization is terminated or revoked.  Performed at [facility], [address] [place], [place] [address]   Culture, Urine     Status: Abnormal   Collection Time: 11/20/20 12:00 PM   Specimen: Urine, Random  Result Value Ref Range Status   Specimen Description URINE, RANDOM  Final   Special Requests NONE  Final   Culture (A)  Final    <10,000 COLONIES/mL INSIGNIFICANT GROWTH Performed at [facility], [place] [address] [place], [place] [address]    Report Status 11/21/2020 FINAL  Final     Labs: Basic Metabolic Panel: Recent Labs  Lab 11/20/20 0817 11/21/20 0340 11/21/20 1610 11/22/20 0338 11/23/20 0219 11/24/20 1008  NA 135 133* 134* 135 136 135  K 3.4* 3.2* 3.9 3.8 3.7 3.9  CL 100 100 103 105 106 101  CO2 25 24 25 25 24 28   GLUCOSE 250* 222* 216* 210* 65* 236*  BUN 13 16 16 15 12 10   CREATININE 1.42* 1.60* 1.47* 1.43* 1.50* 1.46*  CALCIUM 8.9 8.4* 8.3* 8.2* 8.5* 9.1  MG 1.4*  --  1.8  --   --   --    Liver Function Tests: Recent Labs  Lab 11/19/20 1650 11/20/20 0817  AST 30 20  ALT 29 21  ALKPHOS 132* 100  BILITOT 1.0 1.0  PROT 7.3 5.6*  ALBUMIN 3.4* 2.6*   Recent Labs  Lab 11/19/20 1650   LIPASE 36   No results for input(s): AMMONIA in the last 168 hours. CBC: Recent Labs  Lab 11/20/20 0817 11/20/20 1740 11/21/20 0340 11/21/20 1657 11/22/20 0338 11/22/20 0746 11/23/20 0219  WBC 17.2*  --  11.2* 8.6 7.8  --  6.6  NEUTROABS 14.3*  --   --   --   --   --   --   HGB 9.6*   < > 9.0* 9.3* 8.5* 8.9* 8.8*  HCT 28.4*   < > 26.4* 27.5* 24.8* 26.4* 26.3*  MCV 92.5  --  92.6 92.9 93.6  --  93.3  PLT 349  --  365 388 354  --  336   < > = values in this interval not displayed.   Cardiac Enzymes: No results for input(s): CKTOTAL, CKMB, CKMBINDEX, TROPONINI in the last 168 hours. BNP: BNP (last 3 results) Recent Labs    11/19/20 1650  BNP 897.2*    ProBNP (last 3 results) No results for input(s): PROBNP in the last 8760 hours.  CBG: Recent Labs  Lab 11/24/20 1615 11/24/20 1942 11/24/20 2108 11/25/20 0815 11/25/20 1156  GLUCAP 206* 74 86 194* 196*       Signed:  Kayleen Memos, MD [facility] 11/25/2020, 2:25 PM

## 2020-11-26 ENCOUNTER — Other Ambulatory Visit: Payer: Self-pay

## 2020-11-26 ENCOUNTER — Other Ambulatory Visit (HOSPITAL_COMMUNITY): Payer: Self-pay

## 2020-11-26 DIAGNOSIS — E1159 Type 2 diabetes mellitus with other circulatory complications: Secondary | ICD-10-CM

## 2020-11-26 DIAGNOSIS — I10 Essential (primary) hypertension: Secondary | ICD-10-CM

## 2020-11-26 DIAGNOSIS — I2510 Atherosclerotic heart disease of native coronary artery without angina pectoris: Secondary | ICD-10-CM

## 2020-11-26 DIAGNOSIS — I4892 Unspecified atrial flutter: Secondary | ICD-10-CM

## 2020-11-26 DIAGNOSIS — N1831 Chronic kidney disease, stage 3a: Secondary | ICD-10-CM

## 2020-12-23 ENCOUNTER — Ambulatory Visit: Payer: Self-pay | Admitting: Cardiology

## 2020-12-24 ENCOUNTER — Telehealth: Payer: Self-pay

## 2020-12-24 NOTE — Telephone Encounter (Signed)
I have tried to contact patient this patient, called his brother [name], on file, and he will call him or go by his home to have him call us.

## 2020-12-27 NOTE — Telephone Encounter (Signed)
Patient's brother [name] called back stating that he also cannot get in contact with the patient either. He has called and texted the patient, but patient has not called nor texted him back yet. He stated in the VM that he will keep trying to call patient and have him call us, when/if he gets in tough with him.

## 2020-12-27 NOTE — Telephone Encounter (Signed)
Patient called back and stated that he had picked up the phone, because he has not had a ride. Patient happen with be with the person who can bring him to his appointment, so I transferred call to front desk to schedule appointment. Patient was advised the importance of the appointment and not to miss it.

## 2021-01-22 ENCOUNTER — Encounter (HOSPITAL_COMMUNITY): Payer: Self-pay

## 2021-01-22 ENCOUNTER — Other Ambulatory Visit: Payer: Self-pay

## 2021-01-22 ENCOUNTER — Emergency Department (HOSPITAL_COMMUNITY): Payer: Medicare Other | Admitting: Certified Registered Nurse Anesthetist

## 2021-01-22 ENCOUNTER — Observation Stay (HOSPITAL_COMMUNITY)
Admission: EM | Admit: 2021-01-22 | Discharge: 2021-01-23 | Disposition: A | Discharge status: Home / Self Care | Payer: Medicare Other | Attending: Surgery | Admitting: Surgery

## 2021-01-22 ENCOUNTER — Encounter (HOSPITAL_COMMUNITY)
Admission: EM | Disposition: A | Discharge status: Home / Self Care | Payer: Self-pay | Source: Home / Self Care | Attending: Emergency Medicine

## 2021-01-22 ENCOUNTER — Emergency Department (HOSPITAL_COMMUNITY): Payer: Medicare Other

## 2021-01-22 DIAGNOSIS — I129 Hypertensive chronic kidney disease with stage 1 through stage 4 chronic kidney disease, or unspecified chronic kidney disease: Secondary | ICD-10-CM | POA: Insufficient documentation

## 2021-01-22 DIAGNOSIS — Z8673 Personal history of transient ischemic attack (TIA), and cerebral infarction without residual deficits: Secondary | ICD-10-CM | POA: Diagnosis not present

## 2021-01-22 DIAGNOSIS — Z20822 Contact with and (suspected) exposure to covid-19: Secondary | ICD-10-CM | POA: Diagnosis not present

## 2021-01-22 DIAGNOSIS — Z79899 Other long term (current) drug therapy: Secondary | ICD-10-CM | POA: Insufficient documentation

## 2021-01-22 DIAGNOSIS — K4030 Unilateral inguinal hernia, with obstruction, without gangrene, not specified as recurrent: Secondary | ICD-10-CM | POA: Diagnosis not present

## 2021-01-22 DIAGNOSIS — N1831 Chronic kidney disease, stage 3a: Secondary | ICD-10-CM | POA: Diagnosis not present

## 2021-01-22 DIAGNOSIS — Z7984 Long term (current) use of oral hypoglycemic drugs: Secondary | ICD-10-CM | POA: Insufficient documentation

## 2021-01-22 DIAGNOSIS — Z794 Long term (current) use of insulin: Secondary | ICD-10-CM | POA: Insufficient documentation

## 2021-01-22 DIAGNOSIS — I2510 Atherosclerotic heart disease of native coronary artery without angina pectoris: Secondary | ICD-10-CM | POA: Diagnosis not present

## 2021-01-22 DIAGNOSIS — Z7901 Long term (current) use of anticoagulants: Secondary | ICD-10-CM | POA: Diagnosis not present

## 2021-01-22 DIAGNOSIS — E119 Type 2 diabetes mellitus without complications: Secondary | ICD-10-CM | POA: Insufficient documentation

## 2021-01-22 DIAGNOSIS — K460 Unspecified abdominal hernia with obstruction, without gangrene: Secondary | ICD-10-CM

## 2021-01-22 DIAGNOSIS — K4090 Unilateral inguinal hernia, without obstruction or gangrene, not specified as recurrent: Secondary | ICD-10-CM

## 2021-01-22 HISTORY — PX: INSERTION OF MESH: SHX5868

## 2021-01-22 HISTORY — PX: INGUINAL HERNIA REPAIR: SHX194

## 2021-01-22 LAB — URINALYSIS, ROUTINE W REFLEX MICROSCOPIC
Bilirubin Urine: NEGATIVE
Glucose, UA: >=500 mg/dL — AB
Hgb urine dipstick: NEGATIVE
Ketones, ur: 5 mg/dL — AB
Leukocytes,Ua: NEGATIVE
Nitrite: NEGATIVE
Protein, ur: 30 mg/dL — AB
Specific Gravity, Urine: 1.013 (ref 1.005–1.030)
pH: 6 (ref 5.0–8.0)

## 2021-01-22 LAB — CBC WITH DIFFERENTIAL/PLATELET
Abs Immature Granulocytes: 0.04 10*3/uL (ref 0.00–0.07)
Basophils Absolute: 0.1 10*3/uL (ref 0.0–0.1)
Basophils Relative: 1 %
Eosinophils Absolute: 0 10*3/uL (ref 0.0–0.5)
Eosinophils Relative: 0 %
HCT: 36.5 % — ABNORMAL LOW (ref 39.0–52.0)
Hemoglobin: 12.1 g/dL — ABNORMAL LOW (ref 13.0–17.0)
Immature Granulocytes: 1 %
Lymphocytes Relative: 10 %
Lymphs Abs: 0.9 10*3/uL (ref 0.7–4.0)
MCH: 31.1 pg (ref 26.0–34.0)
MCHC: 33.2 g/dL (ref 30.0–36.0)
MCV: 93.8 fL (ref 80.0–100.0)
Monocytes Absolute: 0.6 10*3/uL (ref 0.1–1.0)
Monocytes Relative: 7 %
Neutro Abs: 6.9 10*3/uL (ref 1.7–7.7)
Neutrophils Relative %: 81 %
Platelets: 251 10*3/uL (ref 150–400)
RBC: 3.89 MIL/uL — ABNORMAL LOW (ref 4.22–5.81)
RDW: 13.2 % (ref 11.5–15.5)
WBC: 8.5 10*3/uL (ref 4.0–10.5)
nRBC: 0 % (ref 0.0–0.2)

## 2021-01-22 LAB — COMPREHENSIVE METABOLIC PANEL
ALT: 14 U/L (ref 0–44)
AST: 16 U/L (ref 15–41)
Albumin: 3.8 g/dL (ref 3.5–5.0)
Alkaline Phosphatase: 96 U/L (ref 38–126)
Anion gap: 13 (ref 5–15)
BUN: 15 mg/dL (ref 8–23)
CO2: 22 mmol/L (ref 22–32)
Calcium: 9.3 mg/dL (ref 8.9–10.3)
Chloride: 104 mmol/L (ref 98–111)
Creatinine, Ser: 1.39 mg/dL — ABNORMAL HIGH (ref 0.61–1.24)
GFR, Estimated: 51 mL/min — ABNORMAL LOW (ref >60)
Glucose, Bld: 232 mg/dL — ABNORMAL HIGH (ref 70–99)
Potassium: 3.9 mmol/L (ref 3.5–5.1)
Sodium: 139 mmol/L (ref 135–145)
Total Bilirubin: 1 mg/dL (ref 0.3–1.2)
Total Protein: 6.7 g/dL (ref 6.5–8.1)

## 2021-01-22 LAB — LIPASE, BLOOD: Lipase: 31 U/L (ref 11–51)

## 2021-01-22 SURGERY — REPAIR, HERNIA, INGUINAL, ADULT
Anesthesia: General | Site: Inguinal | Laterality: Left

## 2021-01-22 MED ORDER — SUCCINYLCHOLINE CHLORIDE 200 MG/10ML IV SOSY
PREFILLED_SYRINGE | INTRAVENOUS | Status: AC
  Filled 2021-01-22: qty 10

## 2021-01-22 MED ORDER — FENTANYL CITRATE (PF) 250 MCG/5ML IJ SOLN
INTRAMUSCULAR | Status: AC
  Filled 2021-01-22: qty 5

## 2021-01-22 MED ORDER — BUPIVACAINE-EPINEPHRINE (PF) 0.25% -1:200000 IJ SOLN
INTRAMUSCULAR | Status: AC
  Filled 2021-01-22: qty 30

## 2021-01-22 MED ORDER — PHENYLEPHRINE 40 MCG/ML (10ML) SYRINGE FOR IV PUSH (FOR BLOOD PRESSURE SUPPORT)
PREFILLED_SYRINGE | INTRAVENOUS | Status: AC
  Filled 2021-01-22: qty 10

## 2021-01-22 MED ORDER — ROCURONIUM BROMIDE 10 MG/ML (PF) SYRINGE
PREFILLED_SYRINGE | INTRAVENOUS | Status: AC
  Filled 2021-01-22: qty 10

## 2021-01-22 MED ORDER — CEFAZOLIN SODIUM-DEXTROSE 2-4 GM/100ML-% IV SOLN
2.0000 g | INTRAVENOUS | Status: AC
  Administered 2021-01-22: 2 g via INTRAVENOUS

## 2021-01-22 MED ORDER — EPHEDRINE 5 MG/ML INJ
INTRAVENOUS | Status: AC
  Filled 2021-01-22: qty 10

## 2021-01-22 MED ORDER — PROPOFOL 10 MG/ML IV BOLUS
INTRAVENOUS | Status: AC
  Filled 2021-01-22: qty 20

## 2021-01-22 MED ORDER — LIDOCAINE HCL (PF) 2 % IJ SOLN
INTRAMUSCULAR | Status: AC
  Filled 2021-01-22: qty 5

## 2021-01-22 MED ORDER — HYDRALAZINE HCL 25 MG PO TABS
25.0000 mg | ORAL_TABLET | Freq: Once | ORAL | Status: AC
  Administered 2021-01-22: 25 mg via ORAL
  Filled 2021-01-22: qty 1

## 2021-01-22 SURGICAL SUPPLY — 45 items
ADH SKN CLS APL DERMABOND .7 (GAUZE/BANDAGES/DRESSINGS) ×1
APL PRP STRL LF DISP 70% ISPRP (MISCELLANEOUS) ×1
BLADE CLIPPER SURG (BLADE) ×2 IMPLANT
CANISTER SUCT 3000ML PPV (MISCELLANEOUS) ×2 IMPLANT
CHLORAPREP W/TINT 26 (MISCELLANEOUS) ×3 IMPLANT
COVER SURGICAL LIGHT HANDLE (MISCELLANEOUS) ×3 IMPLANT
COVER WAND RF STERILE (DRAPES) ×3 IMPLANT
DERMABOND ADVANCED (GAUZE/BANDAGES/DRESSINGS) ×1
DERMABOND ADVANCED .7 DNX12 (GAUZE/BANDAGES/DRESSINGS) ×2 IMPLANT
DRAIN PENROSE 1/2X12 LTX STRL (WOUND CARE) ×2 IMPLANT
DRAPE LAPAROTOMY TRNSV 102X78 (DRAPES) ×3 IMPLANT
ELECT REM PT RETURN 9FT ADLT (ELECTROSURGICAL) ×3
ELECTRODE REM PT RTRN 9FT ADLT (ELECTROSURGICAL) ×2 IMPLANT
GLOVE BIO SURGEON STRL SZ8 (GLOVE) ×3 IMPLANT
GLOVE SRG 8 PF TXTR STRL LF DI (GLOVE) ×2 IMPLANT
GLOVE SURG UNDER POLY LF SZ8 (GLOVE) ×3
GOWN STRL REUS W/ TWL LRG LVL3 (GOWN DISPOSABLE) ×2 IMPLANT
GOWN STRL REUS W/ TWL XL LVL3 (GOWN DISPOSABLE) ×2 IMPLANT
GOWN STRL REUS W/TWL LRG LVL3 (GOWN DISPOSABLE) ×3
GOWN STRL REUS W/TWL XL LVL3 (GOWN DISPOSABLE) ×3
KIT BASIN OR (CUSTOM PROCEDURE TRAY) ×3 IMPLANT
KIT TURNOVER KIT B (KITS) ×3 IMPLANT
MESH ULTRAPRO 3X6 7.6X15CM (Mesh General) ×2 IMPLANT
NDL HYPO 25GX1X1/2 BEV (NEEDLE) ×1 IMPLANT
NEEDLE HYPO 25GX1X1/2 BEV (NEEDLE) ×3 IMPLANT
NS IRRIG 1000ML POUR BTL (IV SOLUTION) ×3 IMPLANT
PACK GENERAL/GYN (CUSTOM PROCEDURE TRAY) ×3 IMPLANT
PAD ARMBOARD 7.5X6 YLW CONV (MISCELLANEOUS) ×3 IMPLANT
PENCIL SMOKE EVACUATOR (MISCELLANEOUS) ×3 IMPLANT
SUT MNCRL AB 4-0 PS2 18 (SUTURE) ×3 IMPLANT
SUT NOVA NAB DX-16 0-1 5-0 T12 (SUTURE) ×1 IMPLANT
SUT PDS AB 0 CT 36 (SUTURE) ×2 IMPLANT
SUT PDS AB 0 CT1 27 (SUTURE) ×2 IMPLANT
SUT SILK 2 0 SH (SUTURE) IMPLANT
SUT VIC AB 0 CT1 27 (SUTURE)
SUT VIC AB 0 CT1 27XBRD ANBCTR (SUTURE) IMPLANT
SUT VIC AB 2-0 SH 27 (SUTURE)
SUT VIC AB 2-0 SH 27X BRD (SUTURE) ×1 IMPLANT
SUT VIC AB 3-0 SH 18 (SUTURE) ×1 IMPLANT
SUT VIC AB 3-0 SH 27 (SUTURE) ×6
SUT VIC AB 3-0 SH 27XBRD (SUTURE) ×2 IMPLANT
SUT VICRYL AB 3 0 TIES (SUTURE) ×1 IMPLANT
SYR CONTROL 10ML LL (SYRINGE) ×3 IMPLANT
TOWEL GREEN STERILE (TOWEL DISPOSABLE) ×3 IMPLANT
TOWEL GREEN STERILE FF (TOWEL DISPOSABLE) ×3 IMPLANT

## 2021-01-22 NOTE — H&P (Signed)
Phillip Oconnor [birth year]  [id number].    Requesting MD: Dr. Almyra Free Chief Complaint/Reason for Consult: incarcerated inguinal hernia  HPI:  Phillip Oconnor is an 80 yo male with a history of HTN, DM, seizures and a-fib who presented to the ED today with LLQ and left groin pain. This morning he developed the pain and had an episode of vomiting associated with it. He also noticed a "knot" in his left groin. Before today he did not notice a hernia. He reports he previously had a right inguinal hernia repaired laparoscopically in 2010. He presented to the ED and a CT scan showed a left inguinal hernia containing bowel. It was partially reduced in the ED but the patient still has significant pain and tenderness. General surgery was consulted.  The patient has a history of a-fib and has a loop recorder in place. He was previously on Eliquis but says he has not been taking it recently. He takes aspirin 325mg  daily. He reports he walks 1 mile daily and climbs 3 flights of stairs, both of which he did this morning. He has been NPO for 12 hours.  ROS: Review of Systems  Constitutional: Negative for chills and fever.  Respiratory: Negative for shortness of breath and wheezing.   Cardiovascular: Negative for chest pain.  Gastrointestinal: Positive for abdominal pain, nausea and vomiting.  Neurological: Negative for weakness.       H/o seizures    Family History  Problem Relation Age of Onset  . Bleeding Disorder Neg Hx     Past Medical History:  Diagnosis Date  . DM2 (diabetes mellitus, type 2) ([place])   . HLD (hyperlipidemia)   . HTN (hypertension)   . Irregular heart beat   . Seizures ([place])   . Stroke [facility])     Past Surgical History:  Procedure Laterality Date  . CORONARY ANGIOPLASTY WITH STENT PLACEMENT    . LOOP RECORDER IMPLANT      Social History:  reports that he has never smoked. He has never used smokeless tobacco. He reports that he does not drink alcohol and does not use  drugs.  Allergies:  Allergies  Allergen Reactions  . Penicillins Rash and Swelling    Lips swell  . Insulin Lispro Itching  . Plavix [Clopidogrel] Palpitations    (Not in a hospital admission)    Physical Exam: Blood pressure (!) 190/87, pulse 73, temperature 98 F (36.7 C), temperature source Oral, resp. rate (!) 29, SpO2 96 %. General: resting comfortably, appears stated age, no apparent distress Neurological: alert and oriented, no focal deficits, cranial nerves grossly in tact HEENT: normocephalic, atraumatic, oropharynx clear, no scleral icterus CV: irregularly irregular rhythm, rate 60s-70s, extremities warm and well-perfused Respiratory: normal work of breathing on room air, symmetric chest wall expansion Abdomen: soft, nondistended, tender to palpation in LLQ GU: left inguinal hernia, firm and very tender to palpation, not reducible. No right inguinal hernia appreciated on exam. Extremities: warm and well-perfused, no deformities, moving all extremities spontaneously Psychiatric: normal mood and affect Skin: warm and dry, no jaundice, no rashes or lesions   Results for orders placed or performed during the hospital encounter of 01/22/21 (from the past 48 hour(s))  Comprehensive metabolic panel     Status: Abnormal   Collection Time: 01/22/21  1:04 PM  Result Value Ref Range   Sodium 139 135 - 145 mmol/L   Potassium 3.9 3.5 - 5.1 mmol/L   Chloride 104 98 - 111 mmol/L   CO2  22 22 - 32 mmol/L   Glucose, Bld 232 (H) 70 - 99 mg/dL    Comment: Glucose reference range applies only to samples taken after fasting for at least 8 hours.   BUN 15 8 - 23 mg/dL   Creatinine, Ser 1.39 (H) 0.61 - 1.24 mg/dL   Calcium 9.3 8.9 - 10.3 mg/dL   Total Protein 6.7 6.5 - 8.1 g/dL   Albumin 3.8 3.5 - 5.0 g/dL   AST 16 15 - 41 U/L   ALT 14 0 - 44 U/L   Alkaline Phosphatase 96 38 - 126 U/L   Total Bilirubin 1.0 0.3 - 1.2 mg/dL   GFR, Estimated 51 (L) >60 mL/min    Comment:  (NOTE) Calculated using the CKD-EPI Creatinine Equation (2021)    Anion gap 13 5 - 15    Comment: Performed at [facility] [address] [place], [place] [address]  Lipase, blood     Status: None   Collection Time: 01/22/21  1:04 PM  Result Value Ref Range   Lipase 31 11 - 51 U/L    Comment: Performed at [facility] [address], [place], [place] [address]  CBC with Differential     Status: Abnormal   Collection Time: 01/22/21  1:04 PM  Result Value Ref Range   WBC 8.5 4.0 - 10.5 K/uL   RBC 3.89 (L) 4.22 - 5.81 MIL/uL   Hemoglobin 12.1 (L) 13.0 - 17.0 g/dL   HCT 36.5 (L) 39.0 - 52.0 %   MCV 93.8 80.0 - 100.0 fL   MCH 31.1 26.0 - 34.0 pg   MCHC 33.2 30.0 - 36.0 g/dL   RDW 13.2 11.5 - 15.5 %   Platelets 251 150 - 400 K/uL   nRBC 0.0 0.0 - 0.2 %   Neutrophils Relative % 81 %   Neutro Abs 6.9 1.7 - 7.7 K/uL   Lymphocytes Relative 10 %   Lymphs Abs 0.9 0.7 - 4.0 K/uL   Monocytes Relative 7 %   Monocytes Absolute 0.6 0.1 - 1.0 K/uL   Eosinophils Relative 0 %   Eosinophils Absolute 0.0 0.0 - 0.5 K/uL   Basophils Relative 1 %   Basophils Absolute 0.1 0.0 - 0.1 K/uL   Immature Granulocytes 1 %   Abs Immature Granulocytes 0.04 0.00 - 0.07 K/uL    Comment: Performed at [facility] [address] [place], [place] [address]  Urinalysis, Routine w reflex microscopic Urine, Clean Catch     Status: Abnormal   Collection Time: 01/22/21  5:00 PM  Result Value Ref Range   Color, Urine YELLOW YELLOW   APPearance CLEAR CLEAR   Specific Gravity, Urine 1.013 1.005 - 1.030   pH 6.0 5.0 - 8.0   Glucose, UA >=500 (A) NEGATIVE mg/dL   Hgb urine dipstick NEGATIVE NEGATIVE   Bilirubin Urine NEGATIVE NEGATIVE   Ketones, ur 5 (A) NEGATIVE mg/dL   Protein, ur 30 (A) NEGATIVE mg/dL   Nitrite NEGATIVE NEGATIVE   Leukocytes,Ua NEGATIVE NEGATIVE   RBC / HPF 0-5 0 - 5 RBC/hpf   WBC, UA 0-5 0 - 5 WBC/hpf   Bacteria, UA RARE (A) NONE SEEN   Squamous Epithelial / LPF 0-5 0 -  5   Mucus PRESENT    Sperm, UA PRESENT     Comment: Performed at [facility], [address], [place], [place] [address]   CT ABDOMEN PELVIS WO CONTRAST  Result Date: 01/22/2021 CLINICAL  DATA:  Left lower quadrant abdominal pain. EXAM: CT ABDOMEN AND PELVIS WITHOUT CONTRAST TECHNIQUE: Multidetector CT imaging of the abdomen and pelvis was performed following the standard protocol without IV contrast. COMPARISON:  None. FINDINGS: Lower chest: Subtle tree-in-bud nodularity posterior left lower lobe likely infectious/inflammatory. Hepatobiliary: The liver shows diffusely decreased attenuation suggesting fat deposition. 1.7 cm lesion in the posterior right subdiaphragmatic space is probably exophytic from the liver and could be an exophytic hemangioma or cyst. There is no evidence for gallstones, gallbladder wall thickening, or pericholecystic fluid. No intrahepatic or extrahepatic biliary dilation. Pancreas: No focal mass lesion. No dilatation of the main duct. No intraparenchymal cyst. No peripancreatic edema. Spleen: No splenomegaly. No focal mass lesion. Adrenals/Urinary Tract: No adrenal nodule or mass. Perinephric stranding noted bilaterally which can be a non occult finding. There is no hydronephrosis or gross renal mass lesion evident. 2.3 cm water density lesion upper pole right kidney is compatible with a cyst. 2.3 cm interpolar left renal lesion measures water density, likely a cyst. There is some cortical atrophy in the lower pole left kidney, best appreciated on coronal imaging No evidence for hydroureter. The urinary bladder appears normal for the degree of distention. Stomach/Bowel: Stomach is nondistended. Duodenum is normally positioned as is the ligament of Treitz. No small bowel wall thickening. No small bowel dilatation. The terminal ileum is normal. The appendix is normal. No gross colonic mass. No colonic wall thickening. Vascular/Lymphatic: There is abdominal aortic atherosclerosis  without aneurysm. There is no gastrohepatic or hepatoduodenal ligament lymphadenopathy. No retroperitoneal or mesenteric lymphadenopathy. No pelvic sidewall lymphadenopathy. Reproductive: Prostate gland is enlarged. Other: 5.5 x 3.4 x 4.0 cm soft tissue mass involves the right inferior psoas muscle. The lesion has associated calcification no intraperitoneal free fluid. Musculoskeletal: Mesh in the right lower quadrant suggest prior groin herniorrhaphy. There is a left groin hernia containing a sort segment of small bowel. Coronal imaging shows some fluid in the hernia sac as well. No small bowel wall thickening or evidence of small bowel dilatation proximal to the herniated segment to suggest obstruction. No worrisome lytic or sclerotic osseous abnormality. IMPRESSION: 1. 5.5 x 3.4 x 4.0 cm soft tissue mass involves the right inferior psoas muscle. The lesion has associated calcification but does not appear to represent aneurysmal dilatation of the right common iliac artery. 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 Psoas abscess or lymphadenopathy/neoplasm could also have this appearance. CT abdomen/pelvis with oral and intravenous contrast recommended to further evaluate. 2. Left groin hernia containing a sort segment of small bowel. No evidence for small bowel obstruction. There is a small amount of fluid in the hernia sac but no evidence for small bowel wall thickening. Given absence of obstructive appearance and lack of small bowel wall thickening, incarceration of this loop is felt to be unlikely. Nevertheless, this should be amenable to clinical inspection. 3. Small exophytic lesion posteromedial liver, as above. This could be reassessed at the time of follow-up imaging with contrast material. 4. Hepatic steatosis. 5. Bilateral renal cysts. 6. Subtle tree-in-bud nodularity posterior left lower lobe likely infectious/inflammatory. Atypical infection would be a distinct consideration. 7.  Prostatomegaly. 8. Aortic Atherosclerosis (ICD10-I70.0). Electronically Signed   By: Misty Stanley M.D.   On: 01/22/2021 14:20      Assessment/Plan 80 yo male presenting with an incarcerated left inguinal hernia containing a loop of bowel. Patient is quite tender on exam and I was not able to successfully reduce the hernia on multiple attempts. Will proceed  to the OR emergently for open repair. I discussed the details of the procedure with the patient, including the need for mesh placement. If there is any concern for bowel ischemia he may also require a laparotomy with small bowel resection, although I think this is unlikely. He agrees to proceed with surgery. Proceed to OR and admit for observation postoperatively.  Michaelle Birks, [facility] General, Hepatobiliary and Pancreatic Surgery 01/22/21 11:32 PM

## 2021-01-22 NOTE — Anesthesia Preprocedure Evaluation (Addendum)
Anesthesia Evaluation  Patient identified by MRN, date of birth, ID band Patient awake    Reviewed: Allergy & Precautions, H&P , NPO status , Patient's Chart, lab work & pertinent test results, reviewed documented beta blocker date and time   Airway Mallampati: II  TM Distance: >3 FB Neck ROM: Full    Dental no notable dental hx. (+) Teeth Intact, Dental Advisory Given   Pulmonary neg pulmonary ROS,    Pulmonary exam normal breath sounds clear to auscultation       Cardiovascular hypertension, Pt. on medications and Pt. on home beta blockers + CAD and + Cardiac Stents   Rhythm:Regular Rate:Normal     Neuro/Psych CVA negative psych ROS   GI/Hepatic negative GI ROS, Neg liver ROS,   Endo/Other  diabetes, Type 2, Insulin Dependent, Oral Hypoglycemic Agents  Renal/GU negative Renal ROS  negative genitourinary   Musculoskeletal   Abdominal   Peds  Hematology negative hematology ROS (+)   Anesthesia Other Findings   Reproductive/Obstetrics negative OB ROS                            Anesthesia Physical Anesthesia Plan  ASA: III and emergent  Anesthesia Plan: General   Post-op Pain Management:    Induction: Intravenous, Rapid sequence and Cricoid pressure planned  PONV Risk Score and Plan: 3 and Ondansetron and Treatment may vary due to age or medical condition  Airway Management Planned: Oral ETT  Additional Equipment:   Intra-op Plan:   Post-operative Plan: Extubation in OR  Informed Consent: I have reviewed the patients History and Physical, chart, labs and discussed the procedure including the risks, benefits and alternatives for the proposed anesthesia with the patient or authorized representative who has indicated his/her understanding and acceptance.     Dental advisory given  Plan Discussed with: CRNA  Anesthesia Plan Comments:         Anesthesia Quick  Evaluation

## 2021-01-22 NOTE — ED Provider Notes (Signed)
Emergency Medicine Provider Triage Evaluation Note  Phillip Oconnor , a 80 y.o. male  was evaluated in triage.  Pt complains of lower abd pain, nvd. denies fevers.  Review of Systems  Positive: abd pain, nvd Negative: Fevers, dysuria   Physical Exam  BP (!) 179/63 (BP Location: Left Arm)   Pulse (!) 55   Temp 98 F (36.7 C) (Oral)   Resp 16   SpO2 99%  Gen:   Awake, no distress   Resp:  Normal effort  MSK:   Moves extremities without difficulty  Other:  ttp to the rlq and llq with guardin  Medical Decision Making  Medically screening exam initiated at 1:02 PM.  Appropriate orders placed.  Phillip Oconnor was informed that the remainder of the evaluation will be completed by another provider, this initial triage assessment does not replace that evaluation, and the importance of remaining in the ED until their evaluation is complete.    Bishop Dublin 01/22/21 1306    Luna Fuse, MD 01/22/21 2212

## 2021-01-22 NOTE — ED Triage Notes (Signed)
Patient complains of abdominal pain with nausea, vomiting and diarrhea x 3 hours. Denies dysuria. denies fever.

## 2021-01-22 NOTE — ED Provider Notes (Signed)
[facility] EMERGENCY DEPARTMENT Provider Note   CSN: [id number] Arrival date & time: 01/22/21  1215     History No chief complaint on file.   Phillip Oconnor is a 80 y.o. male.  Patient presents chief complaint left inguinal pain described as sharp and aching and persistent.  Pain started earlier this morning when he was having a bowel movement and straining and felt sudden sharp pain there.  He noticed a bump in that region that reminded him his prior hernia on the right side which she had repaired about 10 to 15 years ago.  Patient otherwise denies any fevers or cough no vomiting or diarrhea.        Past Medical History:  Diagnosis Date  . DM2 (diabetes mellitus, type 2) ([place])   . HLD (hyperlipidemia)   . HTN (hypertension)   . Irregular heart beat   . Seizures ([place])   . Stroke [facility])     Patient Active Problem List   Diagnosis Date Noted  . 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 Precordial pain   . Atherosclerosis of native coronary artery of native heart without angina pectoris   . Hx of heart artery stent   . Long term (current) use of anticoagulants   . Atrial flutter, paroxysmal ([place]) 11/20/2020  . DM2 (diabetes mellitus, type 2) ([place]) 11/20/2020  . HTN (hypertension) 11/20/2020  . History of multiple strokes 11/20/2020  . Seizure ([place]) 11/20/2020    Past Surgical History:  Procedure Laterality Date  . CORONARY ANGIOPLASTY WITH STENT PLACEMENT    . LOOP RECORDER IMPLANT         Family History  Problem Relation Age of Onset  . Bleeding Disorder Neg Hx     Social History   Tobacco Use  . Smoking status: Never Smoker  . Smokeless tobacco: Never Used  Substance Use Topics  . Alcohol use: Never  . Drug use: Never    Home Medications Prior to Admission medications   Medication Sig Start Date End Date Taking? Authorizing Provider  Accu-Chek Softclix Lancets lancets Use as directed up to 6 times  daily. 11/25/20   Kayleen Memos, DO  apixaban (ELIQUIS) 5 MG TABS tablet Take 1 tablet (5 mg total) by mouth 2 (two) times daily. 11/25/20   Kayleen Memos, DO  atorvastatin (LIPITOR) 80 MG tablet Take 1 tablet (80 mg total) by mouth daily. 11/25/20 02/23/21  Kayleen Memos, DO  blood glucose meter kit and supplies Dispense based on patient and insurance preference. Use up to four times daily as directed. (FOR ICD-10 E10.9, E11.9). 11/25/20   Kayleen Memos, DO  carvedilol (COREG) 3.125 MG tablet Take 1 tablet (3.125 mg total) by mouth 2 (two) times daily with a meal. 11/25/20 02/23/21  Kayleen Memos, DO  cyanocobalamin (,VITAMIN B-12,) 1000 MCG/ML injection Inject 1 mL (1,000 mcg total) into the muscle every 30 (thirty) days. 12/22/20   Kayleen Memos, DO  doxazosin (CARDURA) 4 MG tablet Take 4 mg by mouth daily. 10/29/20   [provider]  folic acid (FOLVITE) 1 MG tablet Take 1 tablet (1 mg total) by mouth daily. 11/25/20 02/23/21  Kayleen Memos, DO  glucose blood test strip Use as instructed 11/25/20   Kayleen Memos, DO  hydrALAZINE (APRESOLINE) 50 MG tablet Take 1 tablet (50 mg total) by mouth 3 (three) times daily. 11/25/20 02/23/21  Kayleen Memos, DO  insulin aspart (  NOVOLOG) 100 UNIT/ML FlexPen Inject 3 Units into the skin 3 (three) times daily with meals. 11/25/20   Kayleen Memos, DO  insulin glargine (LANTUS) 100 UNIT/ML Solostar Pen Inject 5 Units into the skin daily. 11/25/20   Kayleen Memos, DO  Insulin Pen Needle (PENTIPS) 32G X 4 MM MISC Use  with insulin, 4 (four) times daily - after meals and at bedtime. 11/25/20   Kayleen Memos, DO  levETIRAcetam (KEPPRA) 500 MG tablet Take 1 tablet (500 mg total) by mouth 2 (two) times daily. 11/25/20 02/23/21  Kayleen Memos, DO  linagliptin (TRADJENTA) 5 MG TABS tablet Take 1 tablet (5 mg total) by mouth daily. 11/25/20   Kayleen Memos, DO  NIFEdipine (PROCARDIA XL/NIFEDICAL-XL) 90 MG 24 hr tablet Take 90 mg by mouth daily. 10/29/20   [provider]    Allergies    Penicillins and Plavix [clopidogrel]  Review of Systems   Review of Systems  Constitutional: Negative for fever.  HENT: Negative for ear pain and sore throat.   Eyes: Negative for pain.  Respiratory: Negative for cough.   Cardiovascular: Negative for chest pain.  Gastrointestinal: Positive for abdominal pain.  Genitourinary: Negative for flank pain.  Musculoskeletal: Negative for back pain.  Skin: Negative for color change and rash.  Neurological: Negative for syncope.  All other systems reviewed and are negative.   Physical Exam Updated Vital Signs BP (!) 194/104   Pulse 77   Temp 98 F (36.7 C) (Oral)   Resp 20   SpO2 100%   Physical Exam Constitutional:      General: He is not in acute distress.    Appearance: He is well-developed.  HENT:     Head: Normocephalic.     Nose: Nose normal.  Eyes:     Extraocular Movements: Extraocular movements intact.  Cardiovascular:     Rate and Rhythm: Normal rate.  Pulmonary:     Effort: Pulmonary effort is normal.  Abdominal:     Comments: Abdomen is diffusely nontender no guarding or rebound.  Left inguinal palpable mass noted.  Consistent with hernia.  I am unable to reduce it.  Skin:    Coloration: Skin is not jaundiced.  Neurological:     Mental Status: He is alert. Mental status is at baseline.     ED Results / Procedures / Treatments   Labs (all labs ordered are listed, but only abnormal results are displayed) Labs Reviewed  COMPREHENSIVE METABOLIC PANEL - Abnormal; Notable for the following components:      Result Value   Glucose, Bld 232 (*)    Creatinine, Ser 1.39 (*)    GFR, Estimated 51 (*)    All other components within normal limits  CBC WITH DIFFERENTIAL/PLATELET - Abnormal; Notable for the following components:   RBC 3.89 (*)    Hemoglobin 12.1 (*)    HCT 36.5 (*)    All other components within normal limits  URINALYSIS, ROUTINE W REFLEX MICROSCOPIC - Abnormal;  Notable for the following components:   Glucose, UA >=500 (*)    Ketones, ur 5 (*)    Protein, ur 30 (*)    Bacteria, UA RARE (*)    All other components within normal limits  SARS CORONAVIRUS 2 BY RT PCR (HOSPITAL ORDER, [facility])  LIPASE, BLOOD    EKG None  Radiology CT ABDOMEN PELVIS WO CONTRAST  Result Date: 01/22/2021 CLINICAL DATA:  Left lower quadrant abdominal pain. EXAM:  CT ABDOMEN AND PELVIS WITHOUT CONTRAST TECHNIQUE: Multidetector CT imaging of the abdomen and pelvis was performed following the standard protocol without IV contrast. COMPARISON:  None. FINDINGS: Lower chest: Subtle tree-in-bud nodularity posterior left lower lobe likely infectious/inflammatory. Hepatobiliary: The liver shows diffusely decreased attenuation suggesting fat deposition. 1.7 cm lesion in the posterior right subdiaphragmatic space is probably exophytic from the liver and could be an exophytic hemangioma or cyst. There is no evidence for gallstones, gallbladder wall thickening, or pericholecystic fluid. No intrahepatic or extrahepatic biliary dilation. Pancreas: No focal mass lesion. No dilatation of the main duct. No intraparenchymal cyst. No peripancreatic edema. Spleen: No splenomegaly. No focal mass lesion. Adrenals/Urinary Tract: No adrenal nodule or mass. Perinephric stranding noted bilaterally which can be a non occult finding. There is no hydronephrosis or gross renal mass lesion evident. 2.3 cm water density lesion upper pole right kidney is compatible with a cyst. 2.3 cm interpolar left renal lesion measures water density, likely a cyst. There is some cortical atrophy in the lower pole left kidney, best appreciated on coronal imaging No evidence for hydroureter. The urinary bladder appears normal for the degree of distention. Stomach/Bowel: Stomach is nondistended. Duodenum is normally positioned as is the ligament of Treitz. No small bowel wall thickening. No small bowel  dilatation. The terminal ileum is normal. The appendix is normal. No gross colonic mass. No colonic wall thickening. Vascular/Lymphatic: There is abdominal aortic atherosclerosis without aneurysm. There is no gastrohepatic or hepatoduodenal ligament lymphadenopathy. No retroperitoneal or mesenteric lymphadenopathy. No pelvic sidewall lymphadenopathy. Reproductive: Prostate gland is enlarged. Other: 5.5 x 3.4 x 4.0 cm soft tissue mass involves the right inferior psoas muscle. The lesion has associated calcification no intraperitoneal free fluid. Musculoskeletal: Mesh in the right lower quadrant suggest prior groin herniorrhaphy. There is a left groin hernia containing a sort segment of small bowel. Coronal imaging shows some fluid in the hernia sac as well. No small bowel wall thickening or evidence of small bowel dilatation proximal to the herniated segment to suggest obstruction. No worrisome lytic or sclerotic osseous abnormality. IMPRESSION: 1. 5.5 x 3.4 x 4.0 cm soft tissue mass involves the right inferior psoas muscle. The lesion has associated calcification but does not appear to represent aneurysmal dilatation of the right common iliac artery. 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 Psoas abscess or lymphadenopathy/neoplasm could also have this appearance. CT abdomen/pelvis with oral and intravenous contrast recommended to further evaluate. 2. Left groin hernia containing a sort segment of small bowel. No evidence for small bowel obstruction. There is a small amount of fluid in the hernia sac but no evidence for small bowel wall thickening. Given absence of obstructive appearance and lack of small bowel wall thickening, incarceration of this loop is felt to be unlikely. Nevertheless, this should be amenable to clinical inspection. 3. Small exophytic lesion posteromedial liver, as above. This could be reassessed at the time of follow-up imaging with contrast material. 4. Hepatic steatosis.  5. Bilateral renal cysts. 6. Subtle tree-in-bud nodularity posterior left lower lobe likely infectious/inflammatory. Atypical infection would be a distinct consideration. 7. Prostatomegaly. 8. Aortic Atherosclerosis (ICD10-I70.0). Electronically Signed   By: Misty Stanley M.D.   On: 01/22/2021 14:20    Procedures Procedures   Medications Ordered in ED Medications  hydrALAZINE (APRESOLINE) tablet 25 mg (25 mg Oral Given 01/22/21 2254)    ED Course  I have reviewed the triage vital signs and the nursing notes.  Pertinent labs & imaging results that were available  during my care of the patient were reviewed by me and considered in my medical decision making (see chart for details).    MDM Rules/Calculators/A&P                          CT shows left inguinal hernia with bowel.  No CT evidence of incarceration noted.  However clinically I am unable to reduce it patient's having persistent pain in the region.  Surgery consulted, they will take the patient up to the OR for incarcerated hernia.   Final Clinical Impression(s) / ED Diagnoses Final diagnoses:  Incarcerated hernia    Rx / DC Orders ED Discharge Orders    None       Luna Fuse, MD 01/22/21 2259

## 2021-01-22 NOTE — ED Notes (Signed)
Pt informed consent signed

## 2021-01-23 ENCOUNTER — Encounter (HOSPITAL_COMMUNITY): Payer: Self-pay | Admitting: Surgery

## 2021-01-23 DIAGNOSIS — E119 Type 2 diabetes mellitus without complications: Secondary | ICD-10-CM | POA: Diagnosis not present

## 2021-01-23 DIAGNOSIS — K4090 Unilateral inguinal hernia, without obstruction or gangrene, not specified as recurrent: Secondary | ICD-10-CM

## 2021-01-23 DIAGNOSIS — K4030 Unilateral inguinal hernia, with obstruction, without gangrene, not specified as recurrent: Secondary | ICD-10-CM | POA: Diagnosis not present

## 2021-01-23 DIAGNOSIS — Z8673 Personal history of transient ischemic attack (TIA), and cerebral infarction without residual deficits: Secondary | ICD-10-CM | POA: Diagnosis not present

## 2021-01-23 DIAGNOSIS — Z20822 Contact with and (suspected) exposure to covid-19: Secondary | ICD-10-CM | POA: Diagnosis not present

## 2021-01-23 LAB — CBC
HCT: 36.4 % — ABNORMAL LOW (ref 39.0–52.0)
Hemoglobin: 12.2 g/dL — ABNORMAL LOW (ref 13.0–17.0)
MCH: 30.8 pg (ref 26.0–34.0)
MCHC: 33.5 g/dL (ref 30.0–36.0)
MCV: 91.9 fL (ref 80.0–100.0)
Platelets: 252 10*3/uL (ref 150–400)
RBC: 3.96 MIL/uL — ABNORMAL LOW (ref 4.22–5.81)
RDW: 13.2 % (ref 11.5–15.5)
WBC: 9.3 10*3/uL (ref 4.0–10.5)
nRBC: 0 % (ref 0.0–0.2)

## 2021-01-23 LAB — BASIC METABOLIC PANEL
Anion gap: 12 (ref 5–15)
BUN: 14 mg/dL (ref 8–23)
CO2: 24 mmol/L (ref 22–32)
Calcium: 9.1 mg/dL (ref 8.9–10.3)
Chloride: 101 mmol/L (ref 98–111)
Creatinine, Ser: 1.32 mg/dL — ABNORMAL HIGH (ref 0.61–1.24)
GFR, Estimated: 54 mL/min — ABNORMAL LOW (ref >60)
Glucose, Bld: 225 mg/dL — ABNORMAL HIGH (ref 70–99)
Potassium: 3.6 mmol/L (ref 3.5–5.1)
Sodium: 137 mmol/L (ref 135–145)

## 2021-01-23 LAB — GLUCOSE, CAPILLARY
Glucose-Capillary: 189 mg/dL — ABNORMAL HIGH (ref 70–99)
Glucose-Capillary: 219 mg/dL — ABNORMAL HIGH (ref 70–99)
Glucose-Capillary: 271 mg/dL — ABNORMAL HIGH (ref 70–99)

## 2021-01-23 LAB — SARS CORONAVIRUS 2 BY RT PCR (HOSPITAL ORDER, PERFORMED IN ~~LOC~~ HOSPITAL LAB): SARS Coronavirus 2: NEGATIVE

## 2021-01-23 MED ORDER — FENTANYL CITRATE (PF) 100 MCG/2ML IJ SOLN: 25.0000 ug | INTRAMUSCULAR | Status: DC | PRN

## 2021-01-23 MED ORDER — DOXAZOSIN MESYLATE 4 MG PO TABS
4.0000 mg | ORAL_TABLET | Freq: Every day | ORAL | Status: DC
  Administered 2021-01-23: 4 mg via ORAL
  Filled 2021-01-23: qty 1

## 2021-01-23 MED ORDER — LACTATED RINGERS IV SOLN: INTRAVENOUS | Status: DC

## 2021-01-23 MED ORDER — ROCURONIUM BROMIDE 10 MG/ML (PF) SYRINGE
PREFILLED_SYRINGE | INTRAVENOUS | Status: DC | PRN
  Administered 2021-01-23: 50 mg via INTRAVENOUS
  Administered 2021-01-23: 10 mg via INTRAVENOUS

## 2021-01-23 MED ORDER — NIFEDIPINE ER OSMOTIC RELEASE 90 MG PO TB24
90.0000 mg | ORAL_TABLET | Freq: Every day | ORAL | Status: DC
  Administered 2021-01-23: 90 mg via ORAL
  Filled 2021-01-23: qty 1

## 2021-01-23 MED ORDER — FOLIC ACID 1 MG PO TABS
1.0000 mg | ORAL_TABLET | Freq: Every day | ORAL | Status: DC
  Administered 2021-01-23: 1 mg via ORAL
  Filled 2021-01-23: qty 1

## 2021-01-23 MED ORDER — INSULIN ASPART 100 UNIT/ML IJ SOLN: 0.0000 [IU] | Freq: Every day | INTRAMUSCULAR | Status: DC

## 2021-01-23 MED ORDER — ATORVASTATIN CALCIUM 80 MG PO TABS
80.0000 mg | ORAL_TABLET | Freq: Every day | ORAL | Status: DC
  Administered 2021-01-23: 80 mg via ORAL
  Filled 2021-01-23: qty 1

## 2021-01-23 MED ORDER — ASPIRIN 325 MG PO TABS
325.0000 mg | ORAL_TABLET | Freq: Every day | ORAL | Status: DC
  Administered 2021-01-23: 325 mg via ORAL
  Filled 2021-01-23: qty 1

## 2021-01-23 MED ORDER — LIDOCAINE 2% (20 MG/ML) 5 ML SYRINGE
INTRAMUSCULAR | Status: DC | PRN
  Administered 2021-01-23: 60 mg via INTRAVENOUS

## 2021-01-23 MED ORDER — ONDANSETRON HCL 4 MG/2ML IJ SOLN: 4.0000 mg | Freq: Four times a day (QID) | INTRAMUSCULAR | Status: DC | PRN

## 2021-01-23 MED ORDER — ONDANSETRON 4 MG PO TBDP: 4.0000 mg | ORAL_TABLET | Freq: Four times a day (QID) | ORAL | Status: DC | PRN

## 2021-01-23 MED ORDER — ACETAMINOPHEN 500 MG PO TABS: 1000.0000 mg | ORAL_TABLET | Freq: Three times a day (TID) | ORAL | 0 refills | Status: AC | PRN

## 2021-01-23 MED ORDER — SUCCINYLCHOLINE CHLORIDE 200 MG/10ML IV SOSY
PREFILLED_SYRINGE | INTRAVENOUS | Status: DC | PRN
  Administered 2021-01-23: 100 mg via INTRAVENOUS

## 2021-01-23 MED ORDER — LEVETIRACETAM 500 MG PO TABS
500.0000 mg | ORAL_TABLET | Freq: Two times a day (BID) | ORAL | Status: DC
  Administered 2021-01-23: 500 mg via ORAL
  Filled 2021-01-23: qty 1

## 2021-01-23 MED ORDER — ACETAMINOPHEN 10 MG/ML IV SOLN
INTRAVENOUS | Status: AC
  Filled 2021-01-23: qty 100

## 2021-01-23 MED ORDER — LACTATED RINGERS IV SOLN: INTRAVENOUS | Status: DC | PRN

## 2021-01-23 MED ORDER — CARVEDILOL 3.125 MG PO TABS
3.1250 mg | ORAL_TABLET | Freq: Two times a day (BID) | ORAL | Status: DC
  Administered 2021-01-23: 3.125 mg via ORAL
  Filled 2021-01-23: qty 1

## 2021-01-23 MED ORDER — PROPOFOL 10 MG/ML IV BOLUS
INTRAVENOUS | Status: DC | PRN
  Administered 2021-01-23: 70 mg via INTRAVENOUS

## 2021-01-23 MED ORDER — HYDRALAZINE HCL 50 MG PO TABS
50.0000 mg | ORAL_TABLET | Freq: Three times a day (TID) | ORAL | Status: DC
  Administered 2021-01-23: 50 mg via ORAL
  Filled 2021-01-23: qty 1

## 2021-01-23 MED ORDER — ENOXAPARIN SODIUM 40 MG/0.4ML IJ SOSY
40.0000 mg | PREFILLED_SYRINGE | INTRAMUSCULAR | Status: DC
  Administered 2021-01-23: 40 mg via SUBCUTANEOUS
  Filled 2021-01-23 (×2): qty 0.4

## 2021-01-23 MED ORDER — FENTANYL CITRATE (PF) 250 MCG/5ML IJ SOLN
INTRAMUSCULAR | Status: DC | PRN
  Administered 2021-01-22 – 2021-01-23 (×2): 50 ug via INTRAVENOUS

## 2021-01-23 MED ORDER — TRAMADOL HCL 50 MG PO TABS: 50.0000 mg | ORAL_TABLET | Freq: Four times a day (QID) | ORAL | 0 refills | Status: AC | PRN

## 2021-01-23 MED ORDER — DEXAMETHASONE SODIUM PHOSPHATE 10 MG/ML IJ SOLN
INTRAMUSCULAR | Status: DC | PRN
  Administered 2021-01-23: 5 mg via INTRAVENOUS

## 2021-01-23 MED ORDER — INSULIN ASPART 100 UNIT/ML IJ SOLN
0.0000 [IU] | Freq: Three times a day (TID) | INTRAMUSCULAR | Status: DC
  Administered 2021-01-23: 3 [IU] via SUBCUTANEOUS
  Administered 2021-01-23: 2 [IU] via SUBCUTANEOUS

## 2021-01-23 MED ORDER — 0.9 % SODIUM CHLORIDE (POUR BTL) OPTIME
TOPICAL | Status: DC | PRN
  Administered 2021-01-23: 1000 mL

## 2021-01-23 MED ORDER — ACETAMINOPHEN 500 MG PO TABS
1000.0000 mg | ORAL_TABLET | Freq: Three times a day (TID) | ORAL | Status: DC
  Administered 2021-01-23: 1000 mg via ORAL
  Filled 2021-01-23 (×3): qty 2

## 2021-01-23 MED ORDER — BUPIVACAINE-EPINEPHRINE 0.25% -1:200000 IJ SOLN
INTRAMUSCULAR | Status: DC | PRN
  Administered 2021-01-23: 20 mL

## 2021-01-23 MED ORDER — TRAMADOL HCL 50 MG PO TABS
50.0000 mg | ORAL_TABLET | Freq: Four times a day (QID) | ORAL | Status: DC | PRN
Start: 2021-01-23 — End: 2021-01-23

## 2021-01-23 MED ORDER — DOCUSATE SODIUM 100 MG PO CAPS
100.0000 mg | ORAL_CAPSULE | Freq: Two times a day (BID) | ORAL | Status: DC
  Administered 2021-01-23: 100 mg via ORAL
  Filled 2021-01-23: qty 1

## 2021-01-23 NOTE — Plan of Care (Signed)

## 2021-01-23 NOTE — Transfer of Care (Signed)
Immediate Anesthesia Transfer of Care Note  Patient: Precious Haws  Procedure(s) Performed: INGUINAL HERNIORRHAPHY (Left: Inguinal)  Patient Location: PACU  Anesthesia Type:General  Level of Consciousness: awake, alert  and oriented  Airway & Oxygen Therapy: Patient Spontanous Breathing and Patient connected to nasal cannula oxygen  Post-op Assessment: Report given to RN and Post -op Vital signs reviewed and stable  Post vital signs: Reviewed and stable  Last Vitals: 0145 Vitals Value Taken Time  BP 165/72 01/23/21   Temp 36.8 C 01/23/21   Pulse 61 01/23/21   Resp 15 01/23/21   SpO2 100 % 01/23/21   Vitals shown include unvalidated device data.  Last Pain:  Vitals:   01/23/21 0215  TempSrc:   PainSc: 0-No pain     Report to [facility] RN in PACU. VSS. Labetalol titrated for high BP trends. See paper charting for anesthesia record from 0100-0145.    Complications: No notable events documented.

## 2021-01-23 NOTE — Discharge Summary (Signed)
[facility] Discharge Summary   Patient ID: Phillip Oconnor MRN: [id number] DOB/AGE: 1940/09/02 80 y.o.  Admit date: 01/22/2021 Discharge date: 01/23/2021  Admitting Diagnosis: incarcerated inguinal hernia  Discharge Diagnosis Open left inguinal hernia repair with mesh for Incarcerated inguinal hernia   Consultants None  Imaging: CT ABDOMEN PELVIS WO CONTRAST  Result Date: 01/22/2021 CLINICAL DATA:  Left lower quadrant abdominal pain. EXAM: CT ABDOMEN AND PELVIS WITHOUT CONTRAST TECHNIQUE: Multidetector CT imaging of the abdomen and pelvis was performed following the standard protocol without IV contrast. COMPARISON:  None. FINDINGS: Lower chest: Subtle tree-in-bud nodularity posterior left lower lobe likely infectious/inflammatory. Hepatobiliary: The liver shows diffusely decreased attenuation suggesting fat deposition. 1.7 cm lesion in the posterior right subdiaphragmatic space is probably exophytic from the liver and could be an exophytic hemangioma or cyst. There is no evidence for gallstones, gallbladder wall thickening, or pericholecystic fluid. No intrahepatic or extrahepatic biliary dilation. Pancreas: No focal mass lesion. No dilatation of the main duct. No intraparenchymal cyst. No peripancreatic edema. Spleen: No splenomegaly. No focal mass lesion. Adrenals/Urinary Tract: No adrenal nodule or mass. Perinephric stranding noted bilaterally which can be a non occult finding. There is no hydronephrosis or gross renal mass lesion evident. 2.3 cm water density lesion upper pole right kidney is compatible with a cyst. 2.3 cm interpolar left renal lesion measures water density, likely a cyst. There is some cortical atrophy in the lower pole left kidney, best appreciated on coronal imaging No evidence for hydroureter. The urinary bladder appears normal for the degree of distention. Stomach/Bowel: Stomach is nondistended. Duodenum is normally positioned as is the ligament of Treitz. No  small bowel wall thickening. No small bowel dilatation. The terminal ileum is normal. The appendix is normal. No gross colonic mass. No colonic wall thickening. Vascular/Lymphatic: There is abdominal aortic atherosclerosis without aneurysm. There is no gastrohepatic or hepatoduodenal ligament lymphadenopathy. No retroperitoneal or mesenteric lymphadenopathy. No pelvic sidewall lymphadenopathy. Reproductive: Prostate gland is enlarged. Other: 5.5 x 3.4 x 4.0 cm soft tissue mass involves the right inferior psoas muscle. The lesion has associated calcification no intraperitoneal free fluid. Musculoskeletal: Mesh in the right lower quadrant suggest prior groin herniorrhaphy. There is a left groin hernia containing a sort segment of small bowel. Coronal imaging shows some fluid in the hernia sac as well. No small bowel wall thickening or evidence of small bowel dilatation proximal to the herniated segment to suggest obstruction. No worrisome lytic or sclerotic osseous abnormality. IMPRESSION: 1. 5.5 x 3.4 x 4.0 cm soft tissue mass involves the right inferior psoas muscle. The lesion has associated calcification but does not appear to represent aneurysmal dilatation of the right common iliac artery. 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 Psoas abscess or lymphadenopathy/neoplasm could also have this appearance. CT abdomen/pelvis with oral and intravenous contrast recommended to further evaluate. 2. Left groin hernia containing a sort segment of small bowel. No evidence for small bowel obstruction. There is a small amount of fluid in the hernia sac but no evidence for small bowel wall thickening. Given absence of obstructive appearance and lack of small bowel wall thickening, incarceration of this loop is felt to be unlikely. Nevertheless, this should be amenable to clinical inspection. 3. Small exophytic lesion posteromedial liver, as above. This could be reassessed at the time of follow-up imaging  with contrast material. 4. Hepatic steatosis. 5. Bilateral renal cysts. 6. Subtle tree-in-bud nodularity posterior left lower lobe likely infectious/inflammatory. Atypical infection would be a distinct consideration. 7.  Prostatomegaly. 8. Aortic Atherosclerosis (ICD10-I70.0). Electronically Signed   By: Misty Stanley M.D.   On: 01/22/2021 14:20    Procedures Dr. Michaelle Birks (01/23/21) -Open left inguinal hernia repair with mesh  Hospital Course:  [name] is an 80 yo male with a history of HTN, DM, seizures and a-fib who presented to the ED with LLQ and left groin pain. He reported he previously had a right inguinal hernia repaired laparoscopically in 2010. He presented to the ED and a CT scan showed a left inguinal hernia containing bowel. It was partially reduced in the ED but the patient still had significant pain and tenderness. General surgery was consulted. Patient was admitted and underwent procedure listed above.  Tolerated procedure well and was transferred to the floor.  Diet was advanced as tolerated.  On the date of his procedure, the patient was voiding well, tolerating diet, ambulating well, pain well controlled, vital signs stable, incisions c/d/i and felt stable for discharge home.  Patient will follow up in our office in 2-3 weeks and knows to call with questions or concerns.    I or a member of my team have reviewed this patient in the Controlled Substance Database.   Allergies as of 01/23/2021       Reactions   Penicillins Rash, Swelling   Lips swell   Insulin Lispro Itching   Plavix [clopidogrel] Palpitations        Medication List     TAKE these medications    Accu-Chek Softclix Lancets lancets Use as directed up to 6 times daily.   acetaminophen 500 MG tablet Commonly known as: TYLENOL Take 2 tablets (1,000 mg total) by mouth every 8 (eight) hours as needed for up to 5 days for mild pain or moderate pain.   aspirin 325 MG tablet Take 325 mg by mouth daily.    atorvastatin 80 MG tablet Commonly known as: LIPITOR Take 1 tablet (80 mg total) by mouth daily.   blood glucose meter kit and supplies Dispense based on patient and insurance preference. Use up to four times daily as directed. (FOR ICD-10 E10.9, E11.9).   carvedilol 3.125 MG tablet Commonly known as: COREG Take 1 tablet (3.125 mg total) by mouth 2 (two) times daily with a meal.   doxazosin 4 MG tablet Commonly known as: CARDURA Take 4 mg by mouth daily.   folic acid 1 MG tablet Commonly known as: FOLVITE Take 1 tablet (1 mg total) by mouth daily.   glipiZIDE 10 MG tablet Commonly known as: GLUCOTROL Take 10 mg by mouth in the morning and at bedtime.   glucose blood test strip Use as instructed   hydrALAZINE 50 MG tablet Commonly known as: APRESOLINE Take 1 tablet (50 mg total) by mouth 3 (three) times daily.   levETIRAcetam 500 MG tablet Commonly known as: KEPPRA Take 1 tablet (500 mg total) by mouth 2 (two) times daily.   metFORMIN 500 MG tablet Commonly known as: GLUCOPHAGE Take 500 mg by mouth 2 (two) times daily.   NIFEdipine 90 MG 24 hr tablet Commonly known as: PROCARDIA XL/NIFEDICAL-XL Take 90 mg by mouth daily.   PenTips 32G X 4 MM Misc Generic drug: Insulin Pen Needle Use  with insulin, 4 (four) times daily - after meals and at bedtime.   traMADol 50 MG tablet Commonly known as: ULTRAM Take 1 tablet (50 mg total) by mouth every 6 (six) hours as needed for up to 5 days for moderate pain or severe pain.  ASK your doctor about these medications    apixaban 5 MG Tabs tablet Commonly known as: ELIQUIS Take 1 tablet (5 mg total) by mouth 2 (two) times daily.   cyanocobalamin 1000 MCG/ML injection Commonly known as: (VITAMIN B-12) Inject 1 mL (1,000 mcg total) into the muscle every 30 (thirty) days.   insulin aspart 100 UNIT/ML FlexPen Commonly known as: NOVOLOG Inject 3 Units into the skin 3 (three) times daily with meals.   insulin  glargine 100 UNIT/ML Solostar Pen Commonly known as: LANTUS Inject 5 Units into the skin daily.   linagliptin 5 MG Tabs tablet Commonly known as: TRADJENTA Take 1 tablet (5 mg total) by mouth daily.          Follow-up Information     Jenel Lucks, PA-C. Call in 2 week(s).   Specialty: Internal Medicine Why: call to follow up from recent hospitalization and plan follow up of new retroperitoneal mass on imaging Contact information: [place] [address] [phone]         Dwan Bolt, MD Follow up in 2 week(s).   Specialty: General Surgery Why: follow up on 02/07/21 at 2:00pm Please arrive 30 minutes prior to your appt time to complete paperwork and bring photo ID and insurance card Contact information: [place]. [address] [place] [address] [phone]                 Signed: Caroll Rancher [facility] 01/23/2021, 11:18 AM Please see Amion for pager number during day hours 7:00am-4:30pm

## 2021-01-23 NOTE — Anesthesia Procedure Notes (Signed)
Procedure Name: Intubation Date/Time: 01/23/2021 12:01 AM Performed by: Jearld Pies, CRNA Pre-anesthesia Checklist: Patient identified, Emergency Drugs available, Suction available and Patient being monitored Patient Re-evaluated:Patient Re-evaluated prior to induction Oxygen Delivery Method: Circle System Utilized Preoxygenation: Pre-oxygenation with 100% oxygen Induction Type: IV induction, Rapid sequence and Cricoid Pressure applied Laryngoscope Size: Miller and 3 Grade View: Grade I Tube type: Oral Tube size: 7.0 mm Number of attempts: 1 Airway Equipment and Method: Stylet Placement Confirmation: ETT inserted through vocal cords under direct vision,  positive ETCO2 and breath sounds checked- equal and bilateral Secured at: 23 cm Tube secured with: Tape Dental Injury: Teeth and Oropharynx as per pre-operative assessment

## 2021-01-23 NOTE — Op Note (Signed)
Date: 01/23/21  Patient: Phillip Oconnor MRN: [id number]  Preoperative Diagnosis: Incarcerated left inguinal hernia Postoperative Diagnosis: Same  Procedure: Open left inguinal hernia repair  Surgeon: Michaelle Birks, MD  EBL: Minimal  Anesthesia: General  Specimens: None  Indications: Phillip Oconnor is an 80 yo male who developed acute left groin pain with a tender nodule yesterday morning. He had associated nausea and vomiting. He presented to the ED and a CT scan confirmed a left inguinal hernia containing a loop of small bowel. The hernia was not reducible at bedside. The patient was brought to the operating room for emergent repair.  Findings: Direct left inguinal hernia containing a loop of bowel, which appeared viable and reduced on opening the inguinal canal. Large cord lipoma. Hernia repaired with an Ultrapro mesh patch.  Procedure details: Informed consent was obtained in the preoperative area prior to the procedure. The patient was brought to the operating room and placed on the table in the supine position. General anesthesia was induced and appropriate lines and drains were placed for intraoperative monitoring. Perioperative antibiotics were administered per SCIP guidelines. The abdomen and groin were prepped and draped in the usual sterile fashion. A pre-procedure timeout was taken verifying patient identity, surgical site and procedure to be performed.  A transverse incision was made in the left groin just superior to the pubic tubercle and extending laterally over the palpable hernia. The subcutaneous tissue and Scarpa's fascia were divided with cautery to expose the external oblique fascia. The hernia was clearly visible protruding from the external inguinal ring, and appeared to contain a loop of bowel. The bowel was visible through the hernia sac and appeared to be viable. The external oblique fascia was nicked with a 15-blade scalpel and opened medially to the external inguinal  ring using metzenbaum scissors. Once the inguinal canal and external ring were opened, the loop of bowel spontaneously reduced back into the abdomen. The cord structures were circumferentially dissected out using gentle blunt dissection, and encircled with a penrose. A direct hernia sac was identified and dissected off the cord structures using blunt dissection and cautery. The sac was then reduced back into the abdomen. A cord lipoma was identified, carefully dissected off the cord structures, and excised and discarded. A sheet of Ultrapro mesh was then brought onto the field and cut to size, with a slit to accommodate the cord structures. The mesh was secured medially to the pubic tubercle and inferiorly to the shelving edge of the inguinal ligament, using a running 0 PDS suture. Superiorly the mesh was secured to the conjoint tendon using interrupted 0 PDS. The tails of the mesh were sutured together laterally to recreate the internal inguinal ring. The surgical site was thoroughly irrigated with saline and appeared hemostatic. The external oblique fascia was closed with a running 3-0 Vicryl suture. Scarpa's fascia was closed a running 3-0 Vicryl, and the skin was closed with a running subcuticular 4-0 monocryl suture. Dermabond was applied.  The patient tolerated the procedure with no apparent complications. All counts were correct x2 at the end of the procedure. The patient was extubated and taken to PACU in stable condition.  Michaelle Birks, MD 01/23/21 3:32 AM

## 2021-01-23 NOTE — Care Management Obs Status (Signed)
[facility] NOTIFICATION   Patient Details  Name: MASIYAH ENGEN MRN: [id number] Date of Birth: Sep 11, 1940   Medicare Observation Status Notification Given:  Yes    Marilu Favre, RN 01/23/2021, 12:02 PM

## 2021-01-23 NOTE — Care Management CC44 (Signed)
Condition Code 44 Documentation Completed  Patient Details  Name: Phillip Oconnor MRN: [id number] Date of Birth: 03-05-1941   Condition Code 44 given:  Yes Patient signature on Condition Code 44 notice:  Yes Documentation of 2 MD's agreement:  Yes Code 44 added to claim:  Yes    Marilu Favre, RN 01/23/2021, 12:02 PM

## 2021-01-23 NOTE — Anesthesia Postprocedure Evaluation (Signed)
Anesthesia Post Note  Patient: Phillip Oconnor  Procedure(s) Performed: INGUINAL HERNIORRHAPHY (Left: Inguinal)     Patient location during evaluation: PACU Anesthesia Type: General Level of consciousness: awake and alert Pain management: pain level controlled Vital Signs Assessment: post-procedure vital signs reviewed and stable Respiratory status: spontaneous breathing, nonlabored ventilation and respiratory function stable Cardiovascular status: blood pressure returned to baseline and stable Postop Assessment: no apparent nausea or vomiting Anesthetic complications: no   No notable events documented.  Last Vitals:  Vitals:   01/23/21 0215 01/23/21 0230  BP: (!) 147/75 (!) 172/80  Pulse: 68 75  Resp: 20 20  Temp:    SpO2: 99% 99%    Last Pain:  Vitals:   01/23/21 0215  TempSrc:   PainSc: 0-No pain                 Quinnlyn Hearns,W. EDMOND

## 2021-01-23 NOTE — Progress Notes (Signed)
Progress Note  1 Day Post-Op  Subjective: CC: feeling well this am. No pain. Has not had breakfast yet but denies any nausea, emesis, abdominal pain. Has not ambulated yet. At baseline he lives alone and ambulates without any assistive devices and reports he is able to manage all his activities of daily living. He has social support from his long term friend Pamala Hurry who was on the phone for a portion of my visit with him  He has bilateral lower extremity edema this morning which he says is baseline for him. He has CHF with recent hospitalization for this   Objective: Vital signs in last 24 hours: Temp:  [97.5 F (36.4 C)-98.3 F (36.8 C)] 98 F (36.7 C) (06/09 0542) Pulse Rate:  [46-77] 62 (06/09 0542) Resp:  [14-29] 22 (06/09 0542) BP: (140-194)/(63-104) 140/67 (06/09 0542) SpO2:  [96 %-100 %] 98 % (06/09 0542)    Intake/Output from previous day: 06/08 0701 - 06/09 0700 In: 1.2 [I.V.:1.2] Out: 275 [Urine:250; Blood:25] Intake/Output this shift: No intake/output data recorded.  PE: General: pleasant, WD, male who is laying in bed in NAD HEENT: head is normocephalic, atraumatic. Mouth is pink and moist Heart: regular, rate, and rhythm. Palpable radial and pedal pulses bilaterally Lungs: CTAB, no wheezes, rhonchi, or rales noted.  Respiratory effort nonlabored Abd: soft, NT, ND, +BS, no masses, hernias, or organomegaly. Left groin incision with glue intact - no erythema or discharge MS: bilateral lower extremity edema, non pitting Skin: warm and dry with no masses, lesions, or rashes Psych: A&Ox3 with an appropriate affect.    Lab Results:  Recent Labs    01/22/21 1304 01/23/21 0417  WBC 8.5 9.3  HGB 12.1* 12.2*  HCT 36.5* 36.4*  PLT 251 252   BMET Recent Labs    01/22/21 1304 01/23/21 0417  NA 139 137  K 3.9 3.6  CL 104 101  CO2 22 24  GLUCOSE 232* 225*  BUN 15 14  CREATININE 1.39* 1.32*  CALCIUM 9.3 9.1   PT/INR No results for input(s): LABPROT, INR  in the last 72 hours. CMP     Component Value Date/Time   NA 137 01/23/2021 0417   K 3.6 01/23/2021 0417   CL 101 01/23/2021 0417   CO2 24 01/23/2021 0417   GLUCOSE 225 (H) 01/23/2021 0417   BUN 14 01/23/2021 0417   CREATININE 1.32 (H) 01/23/2021 0417   CALCIUM 9.1 01/23/2021 0417   PROT 6.7 01/22/2021 1304   ALBUMIN 3.8 01/22/2021 1304   AST 16 01/22/2021 1304   ALT 14 01/22/2021 1304   ALKPHOS 96 01/22/2021 1304   BILITOT 1.0 01/22/2021 1304   GFRNONAA 54 (L) 01/23/2021 0417   Lipase     Component Value Date/Time   LIPASE 31 01/22/2021 1304       Studies/Results: CT ABDOMEN PELVIS WO CONTRAST  Result Date: 01/22/2021 CLINICAL DATA:  Left lower quadrant abdominal pain. EXAM: CT ABDOMEN AND PELVIS WITHOUT CONTRAST TECHNIQUE: Multidetector CT imaging of the abdomen and pelvis was performed following the standard protocol without IV contrast. COMPARISON:  None. FINDINGS: Lower chest: Subtle tree-in-bud nodularity posterior left lower lobe likely infectious/inflammatory. Hepatobiliary: The liver shows diffusely decreased attenuation suggesting fat deposition. 1.7 cm lesion in the posterior right subdiaphragmatic space is probably exophytic from the liver and could be an exophytic hemangioma or cyst. There is no evidence for gallstones, gallbladder wall thickening, or pericholecystic fluid. No intrahepatic or extrahepatic biliary dilation. Pancreas: No focal mass lesion. No dilatation of  the main duct. No intraparenchymal cyst. No peripancreatic edema. Spleen: No splenomegaly. No focal mass lesion. Adrenals/Urinary Tract: No adrenal nodule or mass. Perinephric stranding noted bilaterally which can be a non occult finding. There is no hydronephrosis or gross renal mass lesion evident. 2.3 cm water density lesion upper pole right kidney is compatible with a cyst. 2.3 cm interpolar left renal lesion measures water density, likely a cyst. There is some cortical atrophy in the lower pole left  kidney, best appreciated on coronal imaging No evidence for hydroureter. The urinary bladder appears normal for the degree of distention. Stomach/Bowel: Stomach is nondistended. Duodenum is normally positioned as is the ligament of Treitz. No small bowel wall thickening. No small bowel dilatation. The terminal ileum is normal. The appendix is normal. No gross colonic mass. No colonic wall thickening. Vascular/Lymphatic: There is abdominal aortic atherosclerosis without aneurysm. There is no gastrohepatic or hepatoduodenal ligament lymphadenopathy. No retroperitoneal or mesenteric lymphadenopathy. No pelvic sidewall lymphadenopathy. Reproductive: Prostate gland is enlarged. Other: 5.5 x 3.4 x 4.0 cm soft tissue mass involves the right inferior psoas muscle. The lesion has associated calcification no intraperitoneal free fluid. Musculoskeletal: Mesh in the right lower quadrant suggest prior groin herniorrhaphy. There is a left groin hernia containing a sort segment of small bowel. Coronal imaging shows some fluid in the hernia sac as well. No small bowel wall thickening or evidence of small bowel dilatation proximal to the herniated segment to suggest obstruction. No worrisome lytic or sclerotic osseous abnormality. IMPRESSION: 1. 5.5 x 3.4 x 4.0 cm soft tissue mass involves the right inferior psoas muscle. The lesion has associated calcification but does not appear to represent aneurysmal dilatation of the right common iliac artery. 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 Psoas abscess or lymphadenopathy/neoplasm could also have this appearance. CT abdomen/pelvis with oral and intravenous contrast recommended to further evaluate. 2. Left groin hernia containing a sort segment of small bowel. No evidence for small bowel obstruction. There is a small amount of fluid in the hernia sac but no evidence for small bowel wall thickening. Given absence of obstructive appearance and lack of small bowel wall  thickening, incarceration of this loop is felt to be unlikely. Nevertheless, this should be amenable to clinical inspection. 3. Small exophytic lesion posteromedial liver, as above. This could be reassessed at the time of follow-up imaging with contrast material. 4. Hepatic steatosis. 5. Bilateral renal cysts. 6. Subtle tree-in-bud nodularity posterior left lower lobe likely infectious/inflammatory. Atypical infection would be a distinct consideration. 7. Prostatomegaly. 8. Aortic Atherosclerosis (ICD10-I70.0). Electronically Signed   By: Misty Stanley M.D.   On: 01/22/2021 14:20    Anti-infectives: Anti-infectives (From admission, onward)    Start     Dose/Rate Route Frequency Ordered Stop   01/22/21 2315  ceFAZolin (ANCEF) IVPB 2g/100 mL premix        2 g 200 mL/hr over 30 Minutes Intravenous To [facility] 01/22/21 2303 01/22/21 2350        Assessment/Plan Incarcerated left inguinal hernia - s/p Open left inguinal hernia repair with mesh by SA on 6/9 - no pain, nausea, emesis. See how he tolerates diet and ambulate with nurse this morning - if he tolerates diet and ambulates well then can discharge this afternoon - we discussed he needs follow up with his PCP for further work up retroperitoneal mass and medication management. He states he already has an appointment. PCP is through [facility] and will try to get CT imaging disc for him  prior to dc  FEN: carb mod ID: ancef periop VTE: lovenox  DM2 A fib HLD HTN    LOS: 1 day    Winferd Humphrey, [facility] 01/23/2021, 7:49 AM Please see Amion for pager number during day hours 7:00am-4:30pm

## 2021-01-23 NOTE — Discharge Instructions (Addendum)
DO NOT RESUME ELIQUIS UNTIL 48 HOURS FROM SURGERY - 01/25/21  [facility]      [facility], [place] [id number]  OPEN ABDOMINAL SURGERY: POST OP INSTRUCTIONS  Always review your discharge instruction sheet given to you by the facility where your surgery was performed.  IF YOU HAVE DISABILITY OR FAMILY LEAVE FORMS, YOU MUST BRING THEM TO THE OFFICE FOR PROCESSING.  PLEASE DO NOT GIVE THEM TO YOUR DOCTOR.  A prescription for pain medication may be given to you upon discharge.  Take your pain medication as prescribed, if needed.  If narcotic pain medicine is not needed, then you may take acetaminophen (Tylenol) or ibuprofen (Advil) as needed. Take your usually prescribed medications unless otherwise directed. If you need a refill on your pain medication, please contact your pharmacy. They will contact our office to request authorization.  Prescriptions will not be filled after 5pm or on week-ends. You should follow a light diet the first few days after arrival home, such as soup and crackers, pudding, etc.unless your doctor has advised otherwise. A high-fiber, low fat diet can be resumed as tolerated.   Be sure to include lots of fluids daily. Most patients will experience some swelling and bruising on the chest and neck area.  Ice packs will help.  Swelling and bruising can take several days to resolve Most patients will experience some swelling and bruising in the area of the incision. Ice pack will help. Swelling and bruising can take several days to resolve..  It is common to experience some constipation if taking pain medication after surgery.  Increasing fluid intake and taking a stool softener will usually help or prevent this problem from occurring.  A mild laxative (Milk of Magnesia or Miralax) should be taken according to package directions if there are no bowel movements after 48 hours.  You may have steri-strips (small skin tapes) in place directly over the incision.  These strips should be  left on the skin for 7-10 days.  If your surgeon used skin glue on the incision, you may shower in 24 hours.  The glue will flake off over the next 2-3 weeks.  Any sutures or staples will be removed at the office during your follow-up visit. You may find that a light gauze bandage over your incision may keep your staples from being rubbed or pulled. You may shower and replace the bandage daily. ACTIVITIES:  You may resume regular (light) daily activities beginning the next day--such as daily self-care, walking, climbing stairs--gradually increasing activities as tolerated.  You may have sexual intercourse when it is comfortable.  Refrain from any heavy lifting or straining until approved by your doctor. You may drive when you no longer are taking prescription pain medication, you can comfortably wear a seatbelt, and you can safely maneuver your car and apply brakes Return to Work: ___________________________________ Phillip Oconnor should see your doctor in the office for a follow-up appointment approximately two weeks after your surgery.  Make sure that you call for this appointment within a day or two after you arrive home to insure a convenient appointment time. OTHER INSTRUCTIONS:  _____________________________________________________________ _____________________________________________________________  WHEN TO CALL YOUR DOCTOR: Fever over 101.0 Inability to urinate Nausea and/or vomiting Extreme swelling or bruising Continued bleeding from incision. Increased pain, redness, or drainage from the incision. Difficulty swallowing or breathing Muscle cramping or spasms. Numbness or tingling in hands or feet or around lips.  The clinic staff is available to answer your questions during regular business hours.  Please don't hesitate to call and ask to speak to one of the nurses if you have concerns.  For further questions, please visit [email or web address]

## 2021-01-23 NOTE — Plan of Care (Signed)
  Problem: Education: Goal: Knowledge of General Education information will improve Description: Including pain rating scale, medication(s)/side effects and non-pharmacologic comfort measures 01/23/2021 1125 by Camillia Herter, RN Outcome: Adequate for Discharge 01/23/2021 1049 by Camillia Herter, RN Outcome: Progressing   Problem: Health Behavior/Discharge Planning: Goal: Ability to manage health-related needs will improve 01/23/2021 1125 by Camillia Herter, RN Outcome: Adequate for Discharge 01/23/2021 1049 by Camillia Herter, RN Outcome: Progressing   Problem: Clinical Measurements: Goal: Ability to maintain clinical measurements within normal limits will improve 01/23/2021 1125 by Camillia Herter, RN Outcome: Adequate for Discharge 01/23/2021 1049 by Camillia Herter, RN Outcome: Progressing Goal: Will remain free from infection 01/23/2021 1125 by Camillia Herter, RN Outcome: Adequate for Discharge 01/23/2021 1049 by Camillia Herter, RN Outcome: Progressing Goal: Diagnostic test results will improve 01/23/2021 1125 by Camillia Herter, RN Outcome: Adequate for Discharge 01/23/2021 1049 by Camillia Herter, RN Outcome: Progressing Goal: Respiratory complications will improve 01/23/2021 1125 by Camillia Herter, RN Outcome: Adequate for Discharge 01/23/2021 1049 by Julius Bowels A, RN Outcome: Progressing Goal: Cardiovascular complication will be avoided 01/23/2021 1125 by Camillia Herter, RN Outcome: Adequate for Discharge 01/23/2021 1049 by Camillia Herter, RN Outcome: Progressing   Problem: Activity: Goal: Risk for activity intolerance will decrease 01/23/2021 1125 by Camillia Herter, RN Outcome: Adequate for Discharge 01/23/2021 1049 by Camillia Herter, RN Outcome: Progressing   Problem: Nutrition: Goal: Adequate nutrition will be maintained 01/23/2021 1125 by Camillia Herter, RN Outcome: Adequate for Discharge 01/23/2021 1049 by Camillia Herter, RN Outcome: Progressing    Problem: Coping: Goal: Level of anxiety will decrease 01/23/2021 1125 by Camillia Herter, RN Outcome: Adequate for Discharge 01/23/2021 1049 by Camillia Herter, RN Outcome: Progressing   Problem: Elimination: Goal: Will not experience complications related to bowel motility 01/23/2021 1125 by Camillia Herter, RN Outcome: Adequate for Discharge 01/23/2021 1049 by Camillia Herter, RN Outcome: Progressing Goal: Will not experience complications related to urinary retention 01/23/2021 1125 by Camillia Herter, RN Outcome: Adequate for Discharge 01/23/2021 1049 by Camillia Herter, RN Outcome: Progressing   Problem: Pain Managment: Goal: General experience of comfort will improve 01/23/2021 1125 by Camillia Herter, RN Outcome: Adequate for Discharge 01/23/2021 1049 by Julius Bowels A, RN Outcome: Progressing   Problem: Safety: Goal: Ability to remain free from injury will improve 01/23/2021 1125 by Camillia Herter, RN Outcome: Adequate for Discharge 01/23/2021 1049 by Julius Bowels A, RN Outcome: Progressing   Problem: Skin Integrity: Goal: Risk for impaired skin integrity will decrease 01/23/2021 1125 by Camillia Herter, RN Outcome: Adequate for Discharge 01/23/2021 1049 by Camillia Herter, RN Outcome: Progressing

## 2021-01-24 ENCOUNTER — Encounter (HOSPITAL_COMMUNITY): Payer: Self-pay | Admitting: Surgery

## 2021-02-07 ENCOUNTER — Ambulatory Visit: Payer: Medicare Other | Admitting: Cardiology

## 2021-05-06 ENCOUNTER — Emergency Department (HOSPITAL_BASED_OUTPATIENT_CLINIC_OR_DEPARTMENT_OTHER): Payer: Medicare Other

## 2021-05-06 ENCOUNTER — Encounter (HOSPITAL_BASED_OUTPATIENT_CLINIC_OR_DEPARTMENT_OTHER): Payer: Self-pay

## 2021-05-06 ENCOUNTER — Inpatient Hospital Stay (HOSPITAL_BASED_OUTPATIENT_CLINIC_OR_DEPARTMENT_OTHER)
Admission: EM | Admit: 2021-05-06 | Discharge: 2021-05-14 | DRG: 542 | Disposition: A | Discharge status: Skilled Nursing Facility | Payer: Medicare Other | Attending: Internal Medicine | Admitting: Internal Medicine

## 2021-05-06 ENCOUNTER — Other Ambulatory Visit: Payer: Self-pay

## 2021-05-06 DIAGNOSIS — Z7982 Long term (current) use of aspirin: Secondary | ICD-10-CM

## 2021-05-06 DIAGNOSIS — E785 Hyperlipidemia, unspecified: Secondary | ICD-10-CM | POA: Diagnosis present

## 2021-05-06 DIAGNOSIS — N179 Acute kidney failure, unspecified: Secondary | ICD-10-CM | POA: Diagnosis present

## 2021-05-06 DIAGNOSIS — M6289 Other specified disorders of muscle: Secondary | ICD-10-CM

## 2021-05-06 DIAGNOSIS — C499 Malignant neoplasm of connective and soft tissue, unspecified: Secondary | ICD-10-CM | POA: Diagnosis not present

## 2021-05-06 DIAGNOSIS — Z8673 Personal history of transient ischemic attack (TIA), and cerebral infarction without residual deficits: Secondary | ICD-10-CM

## 2021-05-06 DIAGNOSIS — Z7984 Long term (current) use of oral hypoglycemic drugs: Secondary | ICD-10-CM | POA: Diagnosis not present

## 2021-05-06 DIAGNOSIS — K769 Liver disease, unspecified: Secondary | ICD-10-CM | POA: Diagnosis present

## 2021-05-06 DIAGNOSIS — I10 Essential (primary) hypertension: Secondary | ICD-10-CM | POA: Diagnosis not present

## 2021-05-06 DIAGNOSIS — Z955 Presence of coronary angioplasty implant and graft: Secondary | ICD-10-CM | POA: Diagnosis not present

## 2021-05-06 DIAGNOSIS — Z794 Long term (current) use of insulin: Secondary | ICD-10-CM | POA: Diagnosis not present

## 2021-05-06 DIAGNOSIS — Z7901 Long term (current) use of anticoagulants: Secondary | ICD-10-CM

## 2021-05-06 DIAGNOSIS — Z7189 Other specified counseling: Secondary | ICD-10-CM | POA: Diagnosis not present

## 2021-05-06 DIAGNOSIS — I129 Hypertensive chronic kidney disease with stage 1 through stage 4 chronic kidney disease, or unspecified chronic kidney disease: Secondary | ICD-10-CM | POA: Diagnosis present

## 2021-05-06 DIAGNOSIS — I4892 Unspecified atrial flutter: Secondary | ICD-10-CM | POA: Diagnosis present

## 2021-05-06 DIAGNOSIS — I5031 Acute diastolic (congestive) heart failure: Secondary | ICD-10-CM | POA: Diagnosis not present

## 2021-05-06 DIAGNOSIS — E1165 Type 2 diabetes mellitus with hyperglycemia: Secondary | ICD-10-CM | POA: Diagnosis not present

## 2021-05-06 DIAGNOSIS — G9341 Metabolic encephalopathy: Secondary | ICD-10-CM | POA: Diagnosis not present

## 2021-05-06 DIAGNOSIS — F0390 Unspecified dementia without behavioral disturbance: Secondary | ICD-10-CM | POA: Diagnosis present

## 2021-05-06 DIAGNOSIS — E1122 Type 2 diabetes mellitus with diabetic chronic kidney disease: Secondary | ICD-10-CM | POA: Diagnosis present

## 2021-05-06 DIAGNOSIS — Z515 Encounter for palliative care: Secondary | ICD-10-CM | POA: Diagnosis not present

## 2021-05-06 DIAGNOSIS — I4510 Unspecified right bundle-branch block: Secondary | ICD-10-CM | POA: Diagnosis present

## 2021-05-06 DIAGNOSIS — R531 Weakness: Secondary | ICD-10-CM | POA: Diagnosis not present

## 2021-05-06 DIAGNOSIS — N1831 Chronic kidney disease, stage 3a: Secondary | ICD-10-CM | POA: Diagnosis present

## 2021-05-06 DIAGNOSIS — E861 Hypovolemia: Secondary | ICD-10-CM | POA: Diagnosis present

## 2021-05-06 DIAGNOSIS — I4891 Unspecified atrial fibrillation: Secondary | ICD-10-CM | POA: Diagnosis present

## 2021-05-06 DIAGNOSIS — G40909 Epilepsy, unspecified, not intractable, without status epilepticus: Secondary | ICD-10-CM | POA: Diagnosis present

## 2021-05-06 DIAGNOSIS — R188 Other ascites: Secondary | ICD-10-CM | POA: Diagnosis present

## 2021-05-06 DIAGNOSIS — Z20822 Contact with and (suspected) exposure to covid-19: Secondary | ICD-10-CM | POA: Diagnosis present

## 2021-05-06 DIAGNOSIS — Z880 Allergy status to penicillin: Secondary | ICD-10-CM

## 2021-05-06 DIAGNOSIS — Z79899 Other long term (current) drug therapy: Secondary | ICD-10-CM | POA: Diagnosis not present

## 2021-05-06 DIAGNOSIS — Z888 Allergy status to other drugs, medicaments and biological substances status: Secondary | ICD-10-CM

## 2021-05-06 LAB — COMPREHENSIVE METABOLIC PANEL
ALT: 9 U/L (ref 0–44)
AST: 14 U/L — ABNORMAL LOW (ref 15–41)
Albumin: 3 g/dL — ABNORMAL LOW (ref 3.5–5.0)
Alkaline Phosphatase: 120 U/L (ref 38–126)
Anion gap: 15 (ref 5–15)
BUN: 43 mg/dL — ABNORMAL HIGH (ref 8–23)
CO2: 15 mmol/L — ABNORMAL LOW (ref 22–32)
Calcium: 9.3 mg/dL (ref 8.9–10.3)
Chloride: 108 mmol/L (ref 98–111)
Creatinine, Ser: 2.17 mg/dL — ABNORMAL HIGH (ref 0.61–1.24)
GFR, Estimated: 30 mL/min — ABNORMAL LOW (ref >60)
Glucose, Bld: 171 mg/dL — ABNORMAL HIGH (ref 70–99)
Potassium: 4.4 mmol/L (ref 3.5–5.1)
Sodium: 138 mmol/L (ref 135–145)
Total Bilirubin: 0.8 mg/dL (ref 0.3–1.2)
Total Protein: 6.8 g/dL (ref 6.5–8.1)

## 2021-05-06 LAB — CBG MONITORING, ED: Glucose-Capillary: 163 mg/dL — ABNORMAL HIGH (ref 70–99)

## 2021-05-06 LAB — CBC WITH DIFFERENTIAL/PLATELET
Abs Immature Granulocytes: 0.04 10*3/uL (ref 0.00–0.07)
Basophils Absolute: 0 10*3/uL (ref 0.0–0.1)
Basophils Relative: 0 %
Eosinophils Absolute: 0 10*3/uL (ref 0.0–0.5)
Eosinophils Relative: 0 %
HCT: 40.7 % (ref 39.0–52.0)
Hemoglobin: 13.4 g/dL (ref 13.0–17.0)
Immature Granulocytes: 0 %
Lymphocytes Relative: 7 %
Lymphs Abs: 0.6 10*3/uL — ABNORMAL LOW (ref 0.7–4.0)
MCH: 29.8 pg (ref 26.0–34.0)
MCHC: 32.9 g/dL (ref 30.0–36.0)
MCV: 90.6 fL (ref 80.0–100.0)
Monocytes Absolute: 0.9 10*3/uL (ref 0.1–1.0)
Monocytes Relative: 9 %
Neutro Abs: 8.1 10*3/uL — ABNORMAL HIGH (ref 1.7–7.7)
Neutrophils Relative %: 84 %
Platelets: 171 10*3/uL (ref 150–400)
RBC: 4.49 MIL/uL (ref 4.22–5.81)
RDW: 16.8 % — ABNORMAL HIGH (ref 11.5–15.5)
WBC: 9.7 10*3/uL (ref 4.0–10.5)
nRBC: 0 % (ref 0.0–0.2)

## 2021-05-06 LAB — CK: Total CK: 24 U/L — ABNORMAL LOW (ref 49–397)

## 2021-05-06 LAB — RESP PANEL BY RT-PCR (FLU A&B, COVID) ARPGX2
Influenza A by PCR: NEGATIVE
Influenza B by PCR: NEGATIVE
SARS Coronavirus 2 by RT PCR: NEGATIVE

## 2021-05-06 LAB — LIPASE, BLOOD: Lipase: 29 U/L (ref 11–51)

## 2021-05-06 MED ORDER — SODIUM CHLORIDE 0.9 % IV BOLUS
1000.0000 mL | Freq: Once | INTRAVENOUS | Status: AC
  Administered 2021-05-06: 1000 mL via INTRAVENOUS

## 2021-05-06 MED ORDER — ONDANSETRON HCL 4 MG/2ML IJ SOLN: 4.0000 mg | INTRAMUSCULAR | Status: DC | PRN

## 2021-05-06 NOTE — ED Notes (Signed)
Pt. Is lying on his R hip at this time with no issues

## 2021-05-06 NOTE — ED Notes (Signed)
Pt. Has 4+ pitting edema in tops of feet bilat.  Pt. Daughter in law reports to RN this is normal for the pt. And no as bad as normal.  Pt. In no resp. Distress.

## 2021-05-06 NOTE — ED Notes (Signed)
Pt. Was found in his home after having hip pain on the R side and reports being in the same spot for the last 3 days.  Pt. Sister in law reports the Pt. Had coke cans and food wrappers around him and reports the Pt. Eats salt by the "shaker full".  Pt. Has noted edema in each foot.

## 2021-05-06 NOTE — H&P (Signed)
[place]   PATIENT NAME: Phillip Oconnor    MR#:  [id number]  DATE OF BIRTH:  02-21-1941  DATE OF ADMISSION:  05/06/2021  PRIMARY CARE PHYSICIAN: Jenel Lucks, PA-C   Patient is coming from: Home  REQUESTING/REFERRING PHYSICIAN: [facility]  CHIEF COMPLAINT:   Chief Complaint  Patient presents with   Hip Pain    HISTORY OF PRESENT ILLNESS:  Phillip Oconnor is a 80 y.o. male with medical history significant for DM2, HTN, dyslipidemia, seizures and stroke, who presented to the ER with acute onset of increased weakness with difficulty ambulation due to right lower extremity weakness and pain at the hip.  The patient denies any fever or chills.  No nausea or vomiting or abdominal pain.  No recent trauma.  He denies any cough or chest pain or palpitations.  He has bilateral lower extremity edema that is chronic and for which she has been wearing compressive stockings.  ED Course: When he came to the ER blood pressure was 169/63 with respiratory to 22 and otherwise normal vital signs.  Labs revealed a blood glucose of 171 with CO2 of 15 and creatinine of 2.17 above previous levels with a BUN of 43.  CBC was within normal.  Influenza antigens and COVID-19 PCR came back negative. EKG as reviewed by me : Showed atrial flutter with controlled responsive 65, right bundle branch block and T wave inversion anteroseptally Imaging: Chest x-ray showed no acute cardiopulmonary disease. Abdominal pelvic CT scan showed the following: 1. Soft tissue mass in the right inferior psoas region has significantly increased in size now measuring up to 10.2 cm. 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 2. New small volume ascites. 3. New diffuse body wall edema. 4. New small bilateral pleural effusions. 5. Unchanged exophytic lesion from the right lobe of the liver, indeterminate. This can be further characterized with MRI. 6. Gallbladder sludge. 7. Stable nonspecific bilateral perinephric fat  stranding. Correlate for medical renal disease or infection. 8. Aortic Atherosclerosis.  The patient was given 2 mg of IV morphine sulfate and 4 mg IV Zofran as well as 1 L bolus of IV lactated Ringer. PAST MEDICAL HISTORY:   Past Medical History:  Diagnosis Date   DM2 (diabetes mellitus, type 2) ([place])    HLD (hyperlipidemia)    HTN (hypertension)    Irregular heart beat    Seizures ([place])    Stroke [facility])     PAST SURGICAL HISTORY:   Past Surgical History:  Procedure Laterality Date   CORONARY ANGIOPLASTY WITH STENT PLACEMENT     INGUINAL HERNIA REPAIR Left 01/22/2021   Procedure: INGUINAL HERNIORRHAPHY;  Surgeon: Dwan Bolt, MD;  Location: [place];  Service: General;  Laterality: Left;   INSERTION OF MESH  01/22/2021   Procedure: INSERTION OF MESH;  Surgeon: Dwan Bolt, MD;  Location: [facility];  Service: General;;   LOOP RECORDER IMPLANT      SOCIAL HISTORY:   Social History   Tobacco Use   Smoking status: Never   Smokeless tobacco: Never  Substance Use Topics   Alcohol use: Never    FAMILY HISTORY:   Family History  Problem Relation Age of Onset   Bleeding Disorder Neg Hx     DRUG ALLERGIES:   Allergies  Allergen Reactions   Penicillins Rash and Swelling    Lips swell   Insulin Lispro Itching   Plavix [Clopidogrel] Palpitations    REVIEW OF SYSTEMS:   ROS As per history  of present illness. All pertinent systems were reviewed above. Constitutional, HEENT, cardiovascular, respiratory, GI, GU, musculoskeletal, neuro, psychiatric, endocrine, integumentary and hematologic systems were reviewed and are otherwise negative/unremarkable except for positive findings mentioned above in the HPI.   MEDICATIONS AT HOME:   Prior to Admission medications   Medication Sig Start Date End Date Taking? Authorizing Provider  Accu-Chek Softclix Lancets lancets Use as directed up to 6 times daily. 11/25/20   Kayleen Memos, DO  apixaban (ELIQUIS) 5 MG TABS tablet Take 1  tablet (5 mg total) by mouth 2 (two) times daily. Patient not taking: No sig reported 11/25/20   Kayleen Memos, DO  aspirin 325 MG tablet Take 325 mg by mouth daily.    [provider]  atorvastatin (LIPITOR) 80 MG tablet Take 1 tablet (80 mg total) by mouth daily. 11/25/20 02/23/21  Kayleen Memos, DO  blood glucose meter kit and supplies Dispense based on patient and insurance preference. Use up to four times daily as directed. (FOR ICD-10 E10.9, E11.9). 11/25/20   Kayleen Memos, DO  carvedilol (COREG) 3.125 MG tablet Take 1 tablet (3.125 mg total) by mouth 2 (two) times daily with a meal. 11/25/20 02/23/21  Kayleen Memos, DO  cyanocobalamin (,VITAMIN B-12,) 1000 MCG/ML injection Inject 1 mL (1,000 mcg total) into the muscle every 30 (thirty) days. Patient not taking: No sig reported 12/22/20   Kayleen Memos, DO  doxazosin (CARDURA) 4 MG tablet Take 4 mg by mouth daily. 10/29/20   [provider]  glipiZIDE (GLUCOTROL) 10 MG tablet Take 10 mg by mouth in the morning and at bedtime.    [provider]  glucose blood test strip Use as instructed 11/25/20   Kayleen Memos, DO  hydrALAZINE (APRESOLINE) 50 MG tablet Take 1 tablet (50 mg total) by mouth 3 (three) times daily. 11/25/20 02/23/21  Kayleen Memos, DO  insulin aspart (NOVOLOG) 100 UNIT/ML FlexPen Inject 3 Units into the skin 3 (three) times daily with meals. Patient not taking: No sig reported 11/25/20   Kayleen Memos, DO  insulin glargine (LANTUS) 100 UNIT/ML Solostar Pen Inject 5 Units into the skin daily. Patient not taking: No sig reported 11/25/20   Kayleen Memos, DO  Insulin Pen Needle (PENTIPS) 32G X 4 MM MISC Use  with insulin, 4 (four) times daily - after meals and at bedtime. 11/25/20   Kayleen Memos, DO  levETIRAcetam (KEPPRA) 500 MG tablet Take 1 tablet (500 mg total) by mouth 2 (two) times daily. 11/25/20 02/23/21  Kayleen Memos, DO  linagliptin (TRADJENTA) 5 MG TABS tablet Take 1 tablet (5 mg total) by mouth  daily. Patient not taking: No sig reported 11/25/20   Kayleen Memos, DO  metFORMIN (GLUCOPHAGE) 500 MG tablet Take 500 mg by mouth 2 (two) times daily. 12/26/20   [provider]  NIFEdipine (PROCARDIA XL/NIFEDICAL-XL) 90 MG 24 hr tablet Take 90 mg by mouth daily. 10/29/20   [provider]      VITAL SIGNS:  Blood pressure (!) 164/78, pulse 67, temperature 97.9 F (36.6 C), resp. rate 13, height 6' 4"  (1.93 m), weight 90.2 kg, SpO2 100 %.  PHYSICAL EXAMINATION:  Physical Exam  GENERAL:  80 y.o.-year-old male patient lying in the bed with no acute distress.  EYES: Pupils equal, round, reactive to light and accommodation. No scleral icterus. Extraocular muscles intact.  HEENT: Head atraumatic, normocephalic. Oropharynx and nasopharynx clear.  NECK:  Supple, no jugular  venous distention. No thyroid enlargement, no tenderness.  LUNGS: Normal breath sounds bilaterally, no wheezing, rales,rhonchi or crepitation. No use of accessory muscles of respiration.  CARDIOVASCULAR: Regular rate and rhythm, S1, S2 normal. No murmurs, rubs, or gallops.  ABDOMEN: Soft, nondistended, nontender. Bowel sounds present. No organomegaly or mass.  EXTREMITIES: No pedal edema, cyanosis, or clubbing. Musculoskeletal: Decreased range of motion of the left hip secondary to pain. NEUROLOGIC: Cranial nerves II through XII are intact. Muscle strength 5/5 in all extremities. Sensation intact. Gait not checked.  PSYCHIATRIC: The patient is alert and oriented x 3.  Normal affect and good eye contact. SKIN: No obvious rash, lesion, or ulcer.   LABORATORY PANEL:   CBC Recent Labs  Lab 05/06/21 1817  WBC 9.7  HGB 13.4  HCT 40.7  PLT 171   ------------------------------------------------------------------------------------------------------------------  Chemistries  Recent Labs  Lab 05/06/21 1817  NA 138  K 4.4  CL 108  CO2 15*  GLUCOSE 171*  BUN 43*  CREATININE 2.17*  CALCIUM 9.3  AST  14*  ALT 9  ALKPHOS 120  BILITOT 0.8   ------------------------------------------------------------------------------------------------------------------  Cardiac Enzymes No results for input(s): TROPONINI in the last 168 hours. ------------------------------------------------------------------------------------------------------------------  RADIOLOGY:  CT ABDOMEN PELVIS WO CONTRAST  Result Date: 05/06/2021 CLINICAL DATA:  Abdominal distension.  Right hip pain. EXAM: CT ABDOMEN AND PELVIS WITHOUT CONTRAST TECHNIQUE: Multidetector CT imaging of the abdomen and pelvis was performed following the standard protocol without IV contrast. COMPARISON:  CT abdomen and pelvis 01/22/2021. FINDINGS: Lower chest: There are new small bilateral pleural effusions. There is atelectasis in the bilateral lung bases. Hepatobiliary: Exophytic lesion from the posteromedial right lobe of the liver measuring 14 mm is unchanged. No definite new liver lesions are identified. There is hyperdensity within the gallbladder which may represent gallbladder sludge. There is no biliary ductal dilatation. Pancreas: Unremarkable. No pancreatic ductal dilatation or surrounding inflammatory changes. Spleen: Normal in size without focal abnormality. Adrenals/Urinary Tract: The bilateral adrenal glands are within normal limits. There is no hydronephrosis no urinary tract calculi are seen. There is a left renal cyst measuring 2 cm which is unchanged. There is a stable right renal cyst measuring 2.6 cm. Bilateral perinephric fat stranding is unchanged. Focal calcifications in the right Peri renal fat are stable from the prior examination. The bladder is within normal limits. Stomach/Bowel: Stomach is within normal limits. Appendix appears normal. No evidence of bowel wall thickening, distention, or inflammatory changes. Vascular/Lymphatic: There are atherosclerotic calcifications of the aorta. The aorta is normal in size. No discrete enlarged  lymph nodes are identified. Reproductive: Prostate is unremarkable. Other: Again seen is heterogeneous soft tissue mass containing some calcifications in the right inferior psoas region. The mass has significantly increased in size now measuring 9.2 by 7.9 by 10.2 cm. There is mild surrounding inflammatory stranding, unchanged. There is new small volume ascites with new diffuse haziness of mesentery. There is no free intraperitoneal air. No significant hernia. There is a mesh along the anterior inferior abdominal wall. There is also new diffuse body wall edema. Musculoskeletal: Multilevel degenerative changes affect the spine. IMPRESSION: 1. Soft tissue mass in the right inferior psoas region has significantly increased in size now measuring up to 10.2 cm. 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 2. New small volume ascites. 3. New diffuse body wall edema. 4. New small bilateral pleural effusions. 5. Unchanged exophytic lesion from the right lobe of the liver, indeterminate. This can be further characterized with MRI. 6. Gallbladder sludge. 7. Stable nonspecific  bilateral perinephric fat stranding. Correlate for medical renal disease or infection. 8. Aortic Atherosclerosis (ICD10-I70.0). Electronically Signed   By: Ronney Asters M.D.   On: 05/06/2021 20:09   CT HEAD WO CONTRAST (5MM)  Result Date: 05/06/2021 CLINICAL DATA:  Mental status change of unknown cause. Unable to ambulate for 4 days. Recent hernia surgery. EXAM: CT HEAD WITHOUT CONTRAST TECHNIQUE: Contiguous axial images were obtained from the base of the skull through the vertex without intravenous contrast. COMPARISON:  11/19/2020 FINDINGS: Brain: Large area of encephalomalacia in the right frontal lobe consistent with old infarct. This is unchanged since prior study. No mass effect or midline shift. No abnormal extra-axial fluid collections. No significant ventricular dilatation. Basal cisterns are not effaced. No acute intracranial hemorrhage.  Vascular: Moderate intracranial arterial vascular calcifications. Skull: Calvarium appears intact. Sinuses/Orbits: Mucosal thickening in the paranasal sinuses. No acute air-fluid levels. Mastoid air cells are clear. Other: None. IMPRESSION: No acute intracranial abnormalities. 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 Electronically Signed   By: Lucienne Capers M.D.   On: 05/06/2021 20:00   DG Chest Port 1 View  Result Date: 05/06/2021 CLINICAL DATA:  Weakness EXAM: PORTABLE CHEST 1 VIEW COMPARISON:  11/19/2020 FINDINGS: The heart size and mediastinal contours are within normal limits. No focal pulmonary opacity. Previously noted small pleural effusions are no longer seen. No acute osseous abnormality. IMPRESSION: No active disease. Electronically Signed   By: Merilyn Baba M.D.   On: 05/06/2021 19:22      IMPRESSION AND PLAN:  Active Problems:   Mass of psoas muscle  1.  Right inferior psoas muscle mass likely contributing to weakness of the right lower extremity and difficulty with ambulation. - The patient was directly admitted to a telemetry bed. - Pain management will be provided. - Physical therapy consult to be obtained. - General surgery consult can be called this AM. - We will defer decision about MRI to the surgeon. - We will hold off Eliquis and aspirin.  2.  Acute kidney injury, likely prerenal and hypovolemic superimposed on stage IIIa chronic kidney disease. - The patient will be hydrated with IV normal saline and will follow BMP. - We will hold off nephrotoxins.  3.  Dyslipidemia. - We will continue statin therapy.  4.  Essential hypertension. - We will continue Coreg, Procardia XL, doxazosin and hydralazine.  5.  Type 2 diabetes mellitus. - The patient will be placed on supplement coverage with NovoLog and will continue his basal coverage as well as Tradjenta. - We will hold off metformin given acute kidney injury.  DVT  prophylaxis: Lovenox. Code Status: full code. Family Communication:  The plan of care was discussed in details with the patient (and family). I answered all questions. The patient agreed to proceed with the above mentioned plan. Further management will depend upon hospital course. Disposition Plan: Back to previous home environment Consults called: General surgery.  All the records are reviewed and case discussed with ED provider.  Status is: Inpatient  Remains inpatient appropriate because:Ongoing active pain requiring inpatient pain management, Ongoing diagnostic testing needed not appropriate for outpatient work up, Unsafe d/c plan, IV treatments appropriate due to intensity of illness or inability to take PO, and Inpatient level of care appropriate due to severity of illness  Dispo: The patient is from: Home              Anticipated d/c is to: Home  Patient currently is not medically stable to d/c.   Difficult to place patient No   TOTAL TIME TAKING CARE OF THIS PATIENT: 55 minutes.    Christel Mormon M.D on 05/06/2021 at 11:00 PM  [facility]   From 7 PM-7 AM, contact night-coverage [email or web address]  CC: Primary care physician; Jenel Lucks, PA-C

## 2021-05-06 NOTE — ED Triage Notes (Addendum)
Arrived via [employer], c/o right hip and unable to ambulate x 3-4 days.  Pain started the day before he had hernia surgery on 04-24-2021.  Denies trauma, denies falling.  Per EMS it looked like patient had been lying in the same spot for several days.

## 2021-05-06 NOTE — ED Provider Notes (Signed)
[facility] Provider Note   CSN: [id number] Arrival date & time: 05/06/21  1734     History Chief Complaint  Patient presents with   Hip Pain    TREMOND SHIMABUKURO is a 80 y.o. male hx of HTN, DM, here with right hip pain.  Patient had left inguinal hernia repair 3 months ago.  He has not been doing well since then.  He has poor appetite for the last week or so and has had trouble walking for the last 3 days.  Patient denies any falls.  Patient has history of dementia and family noticed that he is more confused.  Of note, patient was noted to have a possible mass on the right psoas muscle on recent CT scan but did not have oncology follow-up.  The history is provided by the patient.      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)    Precordial pain    Atherosclerosis of native coronary artery of native heart without angina pectoris    Hx of heart artery stent    Long term (current) use of anticoagulants    Atrial flutter, paroxysmal ([place]) 11/20/2020   DM2 (diabetes mellitus, type 2) ([place]) 11/20/2020   HTN (hypertension) 11/20/2020   History of multiple strokes 11/20/2020   Seizure ([place]) 11/20/2020    Past Surgical History:  Procedure Laterality Date   CORONARY ANGIOPLASTY WITH STENT PLACEMENT     INGUINAL HERNIA REPAIR Left 01/22/2021   Procedure: INGUINAL HERNIORRHAPHY;  Surgeon: Dwan Bolt, MD;  Location: [facility];  Service: General;  Laterality: Left;   INSERTION OF MESH  01/22/2021   Procedure: INSERTION OF MESH;  Surgeon: Dwan Bolt, MD;  Location: [facility] OR;  Service: General;;   LOOP RECORDER  IMPLANT         Family History  Problem Relation Age of Onset   Bleeding Disorder Neg Hx     Social History   Tobacco Use   Smoking status: Never   Smokeless tobacco: Never  Substance Use Topics   Alcohol use: Never   Drug use: Never    Home Medications Prior to Admission medications   Medication Sig Start Date End Date Taking? Authorizing Provider  Accu-Chek Softclix Lancets lancets Use as directed up to 6 times daily. 11/25/20   Kayleen Memos, DO  apixaban (ELIQUIS) 5 MG TABS tablet Take 1 tablet (5 mg total) by mouth 2 (two) times daily. Patient not taking: No sig reported 11/25/20   Kayleen Memos, DO  aspirin 325 MG tablet Take 325 mg by mouth daily.    [provider]  atorvastatin (LIPITOR) 80 MG tablet Take 1 tablet (80 mg total) by mouth daily. 11/25/20 02/23/21  Kayleen Memos, DO  blood glucose meter kit and supplies Dispense based on patient and insurance preference. Use up to four times daily as directed. (FOR ICD-10 E10.9, E11.9). 11/25/20   Kayleen Memos, DO  carvedilol (COREG) 3.125 MG tablet Take 1 tablet (3.125 mg total) by mouth 2 (two) times daily with a meal. 11/25/20 02/23/21  Irene Pap N, DO  cyanocobalamin (,VITAMIN B-12,) 1000 MCG/ML injection Inject 1 mL (1,000 mcg total) into the muscle every 30 (thirty) days. Patient not taking: No sig reported 12/22/20   Kayleen Memos, DO  doxazosin (CARDURA) 4 MG tablet Take 4 mg by mouth daily. 10/29/20   [provider]  glipiZIDE (GLUCOTROL) 10 MG tablet Take 10 mg by mouth in the morning and at bedtime.    [provider]  glucose blood test strip Use as instructed 11/25/20   Kayleen Memos, DO  hydrALAZINE (APRESOLINE) 50 MG tablet Take 1 tablet (50 mg total) by mouth 3 (three) times daily. 11/25/20 02/23/21  Kayleen Memos, DO  insulin aspart (NOVOLOG) 100 UNIT/ML FlexPen Inject 3 Units into the skin 3 (three) times daily with meals. Patient not taking: No sig reported 11/25/20   Kayleen Memos, DO  insulin glargine (LANTUS) 100 UNIT/ML Solostar Pen Inject 5 Units into the skin daily. Patient not taking: No sig reported 11/25/20   Kayleen Memos, DO  Insulin Pen Needle (PENTIPS) 32G X 4 MM MISC Use  with insulin, 4 (four) times daily - after meals and at bedtime. 11/25/20   Kayleen Memos, DO  levETIRAcetam (KEPPRA) 500 MG tablet Take 1 tablet (500 mg total) by mouth 2 (two) times daily. 11/25/20 02/23/21  Kayleen Memos, DO  linagliptin (TRADJENTA) 5 MG TABS tablet Take 1 tablet (5 mg total) by mouth daily. Patient not taking: No sig reported 11/25/20   Kayleen Memos, DO  metFORMIN (GLUCOPHAGE) 500 MG tablet Take 500 mg by mouth 2 (two) times daily. 12/26/20   [provider]  NIFEdipine (PROCARDIA XL/NIFEDICAL-XL) 90 MG 24 hr tablet Take 90 mg by mouth daily. 10/29/20   [provider]    Allergies    Penicillins, Insulin lispro, and Plavix [clopidogrel]  Review of Systems   Review of Systems  Gastrointestinal:  Positive for abdominal pain.  Musculoskeletal:        R hip pain  All other systems reviewed and are negative.  Physical Exam Updated Vital Signs BP (!) 167/64   Pulse (!) 55   Temp 97.9 F (36.6 C)   Resp 17   Ht _0  (1.93 m)   Wt 90.2 kg   SpO2 100%   BMI 24.20 kg/m   Physical Exam Vitals and nursing note reviewed.  Constitutional:      Appearance: Normal appearance.  HENT:     Head: Normocephalic.     Nose: Nose normal.     Mouth/Throat:     Mouth: Mucous membranes are dry.  Eyes:     Extraocular Movements: Extraocular movements intact.     Pupils: Pupils are equal, round, and reactive to light.  Cardiovascular:     Rate and Rhythm: Normal rate and regular rhythm.     Pulses: Normal pulses.     Heart sounds: Normal heart sounds.  Pulmonary:     Breath sounds: Normal breath sounds.  Abdominal:     General: Abdomen is flat.     Palpations: Abdomen is soft.     Comments: Mild right lower quadrant tenderness.  Left inguinal  hernia incision site is healing well.  There is no obvious recurrent inguinal hernias.  Genitourinary:    Comments: No obvious testicular tenderness Musculoskeletal:        General: Normal range of motion.     Cervical back: Normal range of motion and neck supple.  Skin:  General: Skin is warm.     Capillary Refill: Capillary refill takes less than 2 seconds.  Neurological:     General: No focal deficit present.     Mental Status: He is alert and oriented to person, place, and time.  Psychiatric:        Mood and Affect: Mood normal.        Behavior: Behavior normal.    ED Results / Procedures / Treatments   Labs (all labs ordered are listed, but only abnormal results are displayed) Labs Reviewed  CBC WITH DIFFERENTIAL/PLATELET - Abnormal; Notable for the following components:      Result Value   RDW 16.8 (*)    Neutro Abs 8.1 (*)    Lymphs Abs 0.6 (*)    All other components within normal limits  COMPREHENSIVE METABOLIC PANEL - Abnormal; Notable for the following components:   CO2 15 (*)    Glucose, Bld 171 (*)    BUN 43 (*)    Creatinine, Ser 2.17 (*)    Albumin 3.0 (*)    AST 14 (*)    GFR, Estimated 30 (*)    All other components within normal limits  CK - Abnormal; Notable for the following components:   Total CK 24 (*)    All other components within normal limits  RESP PANEL BY RT-PCR (FLU A&B, COVID) ARPGX2  LIPASE, BLOOD  URINALYSIS, ROUTINE W REFLEX MICROSCOPIC    EKG None  Radiology CT ABDOMEN PELVIS WO CONTRAST  Result Date: 05/06/2021 CLINICAL DATA:  Abdominal distension.  Right hip pain. EXAM: CT ABDOMEN AND PELVIS WITHOUT CONTRAST TECHNIQUE: Multidetector CT imaging of the abdomen and pelvis was performed following the standard protocol without IV contrast. COMPARISON:  CT abdomen and pelvis 01/22/2021. FINDINGS: Lower chest: There are new small bilateral pleural effusions. There is atelectasis in the bilateral lung bases. Hepatobiliary: Exophytic  lesion from the posteromedial right lobe of the liver measuring 14 mm is unchanged. No definite new liver lesions are identified. There is hyperdensity within the gallbladder which may represent gallbladder sludge. There is no biliary ductal dilatation. Pancreas: Unremarkable. No pancreatic ductal dilatation or surrounding inflammatory changes. Spleen: Normal in size without focal abnormality. Adrenals/Urinary Tract: The bilateral adrenal glands are within normal limits. There is no hydronephrosis no urinary tract calculi are seen. There is a left renal cyst measuring 2 cm which is unchanged. There is a stable right renal cyst measuring 2.6 cm. Bilateral perinephric fat stranding is unchanged. Focal calcifications in the right Peri renal fat are stable from the prior examination. The bladder is within normal limits. Stomach/Bowel: Stomach is within normal limits. Appendix appears normal. No evidence of bowel wall thickening, distention, or inflammatory changes. Vascular/Lymphatic: There are atherosclerotic calcifications of the aorta. The aorta is normal in size. No discrete enlarged lymph nodes are identified. Reproductive: Prostate is unremarkable. Other: Again seen is heterogeneous soft tissue mass containing some calcifications in the right inferior psoas region. The mass has significantly increased in size now measuring 9.2 by 7.9 by 10.2 cm. There is mild surrounding inflammatory stranding, unchanged. There is new small volume ascites with new diffuse haziness of mesentery. There is no free intraperitoneal air. No significant hernia. There is a mesh along the anterior inferior abdominal wall. There is also new diffuse body wall edema. Musculoskeletal: Multilevel degenerative changes affect the spine. IMPRESSION: 1. Soft tissue mass in the right inferior psoas region has significantly increased in size now measuring up to 10.2 cm. Findings are concerning  for neoplasm. 2. New small volume ascites. 3. New diffuse  body wall edema. 4. New small bilateral pleural effusions. 5. Unchanged exophytic lesion from the right lobe of the liver, indeterminate. This can be further characterized with MRI. 6. Gallbladder sludge. 7. Stable nonspecific bilateral perinephric fat stranding. Correlate for medical renal disease or infection. 8. Aortic Atherosclerosis (ICD10-I70.0). Electronically Signed   By: Ronney Asters M.D.   On: 05/06/2021 20:09   CT HEAD WO CONTRAST (5MM)  Result Date: 05/06/2021 CLINICAL DATA:  Mental status change of unknown cause. Unable to ambulate for 4 days. Recent hernia surgery. EXAM: CT HEAD WITHOUT CONTRAST TECHNIQUE: Contiguous axial images were obtained from the base of the skull through the vertex without intravenous contrast. COMPARISON:  11/19/2020 FINDINGS: Brain: Large area of encephalomalacia in the right frontal lobe consistent with old infarct. This is unchanged since prior study. No mass effect or midline shift. No abnormal extra-axial fluid collections. No significant ventricular dilatation. Basal cisterns are not effaced. No acute intracranial hemorrhage. Vascular: Moderate intracranial arterial vascular calcifications. Skull: Calvarium appears intact. Sinuses/Orbits: Mucosal thickening in the paranasal sinuses. No acute air-fluid levels. Mastoid air cells are clear. Other: None. IMPRESSION: No acute intracranial abnormalities. 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 Electronically Signed   By: Lucienne Capers M.D.   On: 05/06/2021 20:00   DG Chest Port 1 View  Result Date: 05/06/2021 CLINICAL DATA:  Weakness EXAM: PORTABLE CHEST 1 VIEW COMPARISON:  11/19/2020 FINDINGS: The heart size and mediastinal contours are within normal limits. No focal pulmonary opacity. Previously noted small pleural effusions are no longer seen. No acute osseous abnormality. IMPRESSION: No active disease. Electronically Signed   By: Merilyn Baba M.D.   On: 05/06/2021  19:22    Procedures Procedures   Medications Ordered in ED Medications  sodium chloride 0.9 % bolus 1,000 mL (0 mLs Intravenous Stopped 05/06/21 1932)    ED Course  I have reviewed the triage vital signs and the nursing notes.  Pertinent labs & imaging results that were available during my care of the patient were reviewed by me and considered in my medical decision making (see chart for details).    MDM Rules/Calculators/A&P                           DREVION OFFORD is a 80 y.o. male here presenting with hip pain.  Patient did have mass of the right psoas muscle.  We will get a CT scan with contrast to further assess that.  Inguinal hernia site appears to be healing well that does not appear to be a recurrent hernia.  At this point, we will get CBC and CMP and urinalysis and CT abdomen pelvis and CT head  8:18 PM Patient's labs showed acute renal failure with creatinine of 2.1.  Bicarb is 15 and anion gap is 15. COVID negative. CT R inferior psoas region that is much larger and he has ascites now and pleural effusions and lesion in the liver. Hospitalist to admit for AKI, psoas mass.   Final Clinical Impression(s) / ED Diagnoses Final diagnoses:  None    Rx / DC Orders ED Discharge Orders     None        Drenda Freeze, MD 05/06/21 2024

## 2021-05-06 NOTE — ED Notes (Signed)
Patient denies pain and is resting comfortably.  

## 2021-05-07 ENCOUNTER — Inpatient Hospital Stay (HOSPITAL_COMMUNITY): Payer: Medicare Other

## 2021-05-07 DIAGNOSIS — M6289 Other specified disorders of muscle: Secondary | ICD-10-CM | POA: Diagnosis not present

## 2021-05-07 LAB — CBC
HCT: 36.2 % — ABNORMAL LOW (ref 39.0–52.0)
Hemoglobin: 11.5 g/dL — ABNORMAL LOW (ref 13.0–17.0)
MCH: 29.3 pg (ref 26.0–34.0)
MCHC: 31.8 g/dL (ref 30.0–36.0)
MCV: 92.1 fL (ref 80.0–100.0)
Platelets: 155 10*3/uL (ref 150–400)
RBC: 3.93 MIL/uL — ABNORMAL LOW (ref 4.22–5.81)
RDW: 16.8 % — ABNORMAL HIGH (ref 11.5–15.5)
WBC: 9.7 10*3/uL (ref 4.0–10.5)
nRBC: 0 % (ref 0.0–0.2)

## 2021-05-07 LAB — BASIC METABOLIC PANEL
Anion gap: 12 (ref 5–15)
BUN: 48 mg/dL — ABNORMAL HIGH (ref 8–23)
CO2: 15 mmol/L — ABNORMAL LOW (ref 22–32)
Calcium: 8.9 mg/dL (ref 8.9–10.3)
Chloride: 111 mmol/L (ref 98–111)
Creatinine, Ser: 2.08 mg/dL — ABNORMAL HIGH (ref 0.61–1.24)
GFR, Estimated: 31 mL/min — ABNORMAL LOW (ref >60)
Glucose, Bld: 173 mg/dL — ABNORMAL HIGH (ref 70–99)
Potassium: 4.5 mmol/L (ref 3.5–5.1)
Sodium: 138 mmol/L (ref 135–145)

## 2021-05-07 LAB — GLUCOSE, CAPILLARY
Glucose-Capillary: 176 mg/dL — ABNORMAL HIGH (ref 70–99)
Glucose-Capillary: 137 mg/dL — ABNORMAL HIGH (ref 70–99)
Glucose-Capillary: 135 mg/dL — ABNORMAL HIGH (ref 70–99)

## 2021-05-07 MED ORDER — INSULIN ASPART 100 UNIT/ML IJ SOLN: 0.0000 [IU] | Freq: Every day | INTRAMUSCULAR | Status: DC

## 2021-05-07 MED ORDER — SODIUM CHLORIDE 0.9 % IV SOLN
INTRAVENOUS | Status: DC
Start: 2021-05-07 — End: 2021-05-12

## 2021-05-07 MED ORDER — GLIPIZIDE 10 MG PO TABS: 10.0000 mg | ORAL_TABLET | Freq: Every day | ORAL | Status: DC

## 2021-05-07 MED ORDER — ACETAMINOPHEN 325 MG PO TABS: 650.0000 mg | ORAL_TABLET | Freq: Four times a day (QID) | ORAL | Status: DC | PRN

## 2021-05-07 MED ORDER — ONDANSETRON HCL 4 MG/2ML IJ SOLN: 4.0000 mg | Freq: Four times a day (QID) | INTRAMUSCULAR | Status: DC | PRN

## 2021-05-07 MED ORDER — ONDANSETRON HCL 4 MG PO TABS: 4.0000 mg | ORAL_TABLET | Freq: Four times a day (QID) | ORAL | Status: DC | PRN

## 2021-05-07 MED ORDER — DOXAZOSIN MESYLATE 2 MG PO TABS
4.0000 mg | ORAL_TABLET | Freq: Every day | ORAL | Status: DC
  Administered 2021-05-07 – 2021-05-14 (×8): 4 mg via ORAL
  Filled 2021-05-07 (×8): qty 2

## 2021-05-07 MED ORDER — CARVEDILOL 3.125 MG PO TABS
3.1250 mg | ORAL_TABLET | Freq: Two times a day (BID) | ORAL | Status: DC
  Administered 2021-05-07 – 2021-05-11 (×5): 3.125 mg via ORAL
  Filled 2021-05-07 (×6): qty 1

## 2021-05-07 MED ORDER — MAGNESIUM HYDROXIDE 400 MG/5ML PO SUSP: 30.0000 mL | Freq: Every day | ORAL | Status: DC | PRN

## 2021-05-07 MED ORDER — INSULIN ASPART 100 UNIT/ML IJ SOLN
0.0000 [IU] | Freq: Three times a day (TID) | INTRAMUSCULAR | Status: DC
  Administered 2021-05-07: 2 [IU] via SUBCUTANEOUS
  Administered 2021-05-07 – 2021-05-08 (×2): 1 [IU] via SUBCUTANEOUS
  Administered 2021-05-08 (×2): 2 [IU] via SUBCUTANEOUS
  Administered 2021-05-09 (×2): 1 [IU] via SUBCUTANEOUS
  Administered 2021-05-10 – 2021-05-11 (×4): 2 [IU] via SUBCUTANEOUS
  Administered 2021-05-11 (×2): 3 [IU] via SUBCUTANEOUS
  Administered 2021-05-12: 2 [IU] via SUBCUTANEOUS
  Administered 2021-05-12: 3 [IU] via SUBCUTANEOUS
  Administered 2021-05-12: 2 [IU] via SUBCUTANEOUS
  Administered 2021-05-13 (×3): 3 [IU] via SUBCUTANEOUS
  Administered 2021-05-14 (×2): 2 [IU] via SUBCUTANEOUS

## 2021-05-07 MED ORDER — ATORVASTATIN CALCIUM 40 MG PO TABS
80.0000 mg | ORAL_TABLET | Freq: Every day | ORAL | Status: DC
  Administered 2021-05-07 – 2021-05-14 (×8): 80 mg via ORAL
  Filled 2021-05-07 (×8): qty 2

## 2021-05-07 MED ORDER — ACETAMINOPHEN 650 MG RE SUPP: 650.0000 mg | Freq: Four times a day (QID) | RECTAL | Status: DC | PRN

## 2021-05-07 MED ORDER — MORPHINE SULFATE (PF) 2 MG/ML IV SOLN
2.0000 mg | INTRAVENOUS | Status: DC | PRN
Start: 2021-05-07 — End: 2021-05-09
  Administered 2021-05-07 – 2021-05-09 (×4): 2 mg via INTRAVENOUS
  Filled 2021-05-07 (×5): qty 1

## 2021-05-07 MED ORDER — LEVETIRACETAM 500 MG PO TABS
500.0000 mg | ORAL_TABLET | Freq: Two times a day (BID) | ORAL | Status: DC
  Administered 2021-05-07 – 2021-05-14 (×15): 500 mg via ORAL
  Filled 2021-05-07 (×17): qty 1

## 2021-05-07 MED ORDER — TRAZODONE HCL 50 MG PO TABS
25.0000 mg | ORAL_TABLET | Freq: Every evening | ORAL | Status: DC | PRN
  Administered 2021-05-09 – 2021-05-10 (×2): 25 mg via ORAL
  Filled 2021-05-07 (×3): qty 1

## 2021-05-07 MED ORDER — OXYCODONE-ACETAMINOPHEN 5-325 MG PO TABS
1.0000 | ORAL_TABLET | Freq: Four times a day (QID) | ORAL | Status: DC | PRN
  Administered 2021-05-07 – 2021-05-13 (×5): 1 via ORAL
  Filled 2021-05-07 (×5): qty 1

## 2021-05-07 MED ORDER — ENOXAPARIN SODIUM 40 MG/0.4ML IJ SOSY
40.0000 mg | PREFILLED_SYRINGE | INTRAMUSCULAR | Status: DC
Start: 2021-05-07 — End: 2021-05-11
  Administered 2021-05-07 – 2021-05-11 (×5): 40 mg via SUBCUTANEOUS
  Filled 2021-05-07 (×5): qty 0.4

## 2021-05-07 NOTE — Progress Notes (Signed)
PROGRESS NOTE  Phillip Oconnor  DOB: Oct 06, 1940  PCP: Jenel Lucks, PA-C BZJ:[id number]  DOA: 05/06/2021  LOS: 1 day  Hospital Day: 2   Chief Complaint  Patient presents with   Hip Pain    Brief narrative: Phillip Oconnor is a 80 y.o. male with PMH significant for DM2, HTN, HLD, stroke, seizure, chronic bilateral lower extremity edema who lives alone at home. Patient presented to the ED on 9/20 with progressively worsening weakness of right lower extremity and right hip pain with difficulty ambulation for 3 to 4 days.  He noticed right hip pain the day before he had a hernia surgery on 01/23/2021.     In the ED, blood pressure was elevated 169/63 Labs with unremarkable CBC, serum bicarb low at 15, creatinine elevated to 2.17. CT abdomen pelvis showed 1. Soft tissue mass in the right inferior psoas region has significantly increased in size measuring up to 10.2 cm when compared to CT scan from 3 months ago. 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 2. New small volume ascites, diffuse body wall edema and small bilateral pleural effusions.  Patient was admitted to hospitalist service for further evaluation management.  Subjective: Patient was seen and examined this morning.  Pleasant elderly African-American male.  Lying down in bed.  Not in distress.  Not in pain at rest.  He does not seem to be aware that the CAT scan of his abdomen from June 2022 had shown mass on his right psoas muscle. Chart reviewed. Labs this morning with creatinine slightly down to 2.08, serum bicarb remains low at 15.  Assessment/Plan: Right inferior psoas muscle mass  -likely contributing to weakness of the right lower extremity and difficulty with ambulation. -Case discussed with oncologist Dr. Julien Nordmann and surgeon Dr. Zenia Resides.  Patient may benefit from MRI abdomen  AKI on CKD 3a -Baseline creatinine less than 1.5.  Presented with a creatinine of 2.17. -Gradually improving with IV fluid.  Continue  to monitor. Recent Labs    11/20/20 0817 11/21/20 0340 11/21/20 1610 11/22/20 0338 11/23/20 0219 11/24/20 1008 01/22/21 1304 01/23/21 0417 05/06/21 1817 05/07/21 0410  BUN 13 16 16 15 12 10 15 14  43* 48*  CREATININE 1.42* 1.60* 1.47* 1.43* 1.50* 1.46* 1.39* 1.32* 2.17* 2.08*   Uncontrolled type 2 diabetes mellitus -A1c 9.6 on April 2022 -Home meds include glipizide 10 mg twice daily, metformin 500 mg twice daily.  Apparently was not taking Tradjenta and insulin at home. -Currently oral meds on hold.  Start sliding scales with Accu-Cheks.  Repeat A1c. -Blood sugar level Lab Results  Component Value Date   HGBA1C 9.6 (H) 11/21/2020   Recent Labs  Lab 05/06/21 2108 05/07/21 1158  GLUCAP 163* 176*   Essential hypertension -Home meds include Coreg 3.125 mg twice daily, doxazosin 4 mg daily  -Continue the same. -Continue to monitor blood pressure  Dyslipidemia History of stroke -Continue aspirin 325 daily Lipitor 80 mg daily  History of seizure disorder -Continue Keppra 5 mg twice daily  Mobility: Impaired mobility.  PT eval ordered. Code Status:   Code Status: Full Code  Nutritional status: Body mass index is 23.64 kg/m.     Diet:  Diet Order             Diet heart healthy/carb modified Room service appropriate? Yes; Fluid consistency: Thin  Diet effective now                  DVT prophylaxis:  enoxaparin (LOVENOX) injection 40 mg  Start: 05/07/21 1000   Antimicrobials: None Fluid: Normal saline at 100 mill per hour Consultants: General surgery, oncology Family Communication: None at bedside  Status is: Inpatient  Remains inpatient appropriate because: Work-up for potential malignancy  Dispo: The patient is from: Home              Anticipated d/c is to: Pending clinical course              Patient currently is not medically stable to d/c.   Difficult to place patient No     Infusions:   sodium chloride 100 mL/hr at 05/07/21 0235     Scheduled Meds:  atorvastatin  80 mg Oral Daily   carvedilol  3.125 mg Oral BID WC   doxazosin  4 mg Oral Daily   enoxaparin (LOVENOX) injection  40 mg Subcutaneous Q24H   insulin aspart  0-5 Units Subcutaneous QHS   insulin aspart  0-9 Units Subcutaneous TID WC   levETIRAcetam  500 mg Oral BID    Antimicrobials: Anti-infectives (From admission, onward)    None       PRN meds: acetaminophen **OR** acetaminophen, magnesium hydroxide, morphine injection, ondansetron (ZOFRAN) IV, ondansetron **OR** ondansetron (ZOFRAN) IV, oxyCODONE-acetaminophen, traZODone   Objective: Vitals:   05/07/21 0915 05/07/21 1115  BP:  121/60  Pulse: 70 70  Resp:  16  Temp:  97.6 F (36.4 C)  SpO2:  100%    Intake/Output Summary (Last 24 hours) at 05/07/2021 1354 Last data filed at 05/07/2021 1236 Gross per 24 hour  Intake 980.3 ml  Output --  Net 980.3 ml   Filed Weights   05/06/21 1746 05/06/21 2342  Weight: 90.2 kg 88.1 kg   Weight change:  Body mass index is 23.64 kg/m.   Physical Exam: General exam: Pleasant elderly African-American male.  Not in pain at rest Skin: No rashes, lesions or ulcers. HEENT: Atraumatic, normocephalic, no obvious bleeding Lungs: Clear to auscultation bilaterally CVS: Regular rate and rhythm, no murmur GI/Abd soft, tenderness present in right lower abdomen, nondistended, bowel sound present. CNS: Alert, awake, oriented x3 Psychiatry: Mood appropriate Extremities: No pedal edema, no calf tenderness  Data Review: I have personally reviewed the laboratory data and studies available.  Recent Labs  Lab 05/06/21 1817 05/07/21 0410  WBC 9.7 9.7  NEUTROABS 8.1*  --   HGB 13.4 11.5*  HCT 40.7 36.2*  MCV 90.6 92.1  PLT 171 155   Recent Labs  Lab 05/06/21 1817 05/07/21 0410  NA 138 138  K 4.4 4.5  CL 108 111  CO2 15* 15*  GLUCOSE 171* 173*  BUN 43* 48*  CREATININE 2.17* 2.08*  CALCIUM 9.3 8.9    F/u labs ordered Unresulted Labs (From  admission, onward)     Start     Ordered   05/08/21 0500  CBC with Differential/Platelet  Daily,   R      05/07/21 1354   05/08/21 4801  Basic metabolic panel  Daily,   R      05/07/21 1354   05/07/21 0903  Hemoglobin A1c  Add-on,   AD       Comments: To assess prior glycemic control    05/07/21 0902            Signed, Terrilee Croak, MD [facility] 05/07/2021

## 2021-05-07 NOTE — Evaluation (Signed)
Physical Therapy Evaluation Patient Details Name: Phillip Oconnor MRN: [id number] DOB: [birth year] Today's Date: 05/07/2021  History of Present Illness  80 y.o. male  who presented to the ER 05/06/21 with acute onset of increased weakness with difficulty ambulation due to right lower extremity weakness and pain at the hip. Dx of R inferior psoas muscle mass, AKI. Pt with medical history significant for DM2, HTN, dyslipidemia, seizures and stroke  Clinical Impression  Pt admitted with above diagnosis. Min assist for supine to sit and sit to supine. Pt sat edge of bed for ~6 minutes. He was lethargic throughout PT session, unable to keep his eyes open for more than a few seconds. He was unwilling to attempt transfers 2* lethargy. SNF recommended.  Pt currently with functional limitations due to the deficits listed below (see PT Problem List). Pt will benefit from skilled PT to increase their independence and safety with mobility to allow discharge to the venue listed below.          Recommendations for follow up therapy are one component of a multi-disciplinary discharge planning process, led by the attending physician.  Recommendations may be updated based on patient status, additional functional criteria and insurance authorization.  Follow Up Recommendations SNF;Supervision/Assistance - 24 hour;Supervision for mobility/OOB    Equipment Recommendations  None recommended by PT    Recommendations for Other Services       Precautions / Restrictions Precautions Precautions: Fall Restrictions Weight Bearing Restrictions: No      Mobility  Bed Mobility Overal bed mobility: Needs Assistance Bed Mobility: Supine to Sit;Sit to Supine     Supine to sit: Min assist Sit to supine: Min assist   General bed mobility comments: assist to raise trunk and then for BLEs into bed. Increased time to respond to commands, multimodal cues to initiate movement    Transfers                  General transfer comment: refused to attempt, pt lethargic in sitting  Ambulation/Gait                Stairs            Wheelchair Mobility    Modified Rankin (Stroke Patients Only)       Balance Overall balance assessment: Needs assistance Sitting-balance support: Feet supported;No upper extremity supported Sitting balance-Leahy Scale: Fair                                       Pertinent Vitals/Pain Pain Assessment: 0-10 Pain Score: 5  Pain Location: R hip Pain Descriptors / Indicators: Grimacing Pain Intervention(s): Limited activity within patient's tolerance;Monitored during session;Premedicated before session;Repositioned    Home Living Family/patient expects to be discharged to:: Private residence Living Arrangements: Alone Available Help at Discharge: Family;Available PRN/intermittently Type of Home: House Home Access: Stairs to enter     Home Layout: Two level Home Equipment: None      Prior Function Level of Independence: Independent         Comments: does all adl's/iadl's per pt except drive due to seizure; info obtained from prior rehab note 11/22/20 as pt was lethargic and not able to provide PLOF     Hand Dominance        Extremity/Trunk Assessment   Upper Extremity Assessment Upper Extremity Assessment: Overall WFL for tasks assessed    Lower Extremity Assessment Lower Extremity Assessment:  Generalized weakness;Difficult to assess due to impaired cognition (pt refused to attempt B knee extension and ankle pumps in sitting due to pain, but did voluntarily place RLE into hook lying position during bed mobility. Pitting edema noted B feet.)    Cervical / Trunk Assessment Cervical / Trunk Assessment: Kyphotic  Communication      Cognition Arousal/Alertness: Lethargic Behavior During Therapy: WFL for tasks assessed/performed Overall Cognitive Status: Within Functional Limits for tasks assessed                                  General Comments: briefly opened eyes, briefly answered questions but would then fall back asleep      General Comments      Exercises     Assessment/Plan    PT Assessment Patient needs continued PT services  PT Problem List Decreased mobility;Decreased strength;Decreased balance;Decreased activity tolerance;Pain       PT Treatment Interventions Therapeutic activities;Therapeutic exercise;Functional mobility training;Patient/family education    PT Goals (Current goals can be found in the Care Plan section)  Acute Rehab PT Goals PT Goal Formulation: Patient unable to participate in goal setting Time For Goal Achievement: 05/21/21 Potential to Achieve Goals: Fair    Frequency Min 2X/week   Barriers to discharge        Co-evaluation               AM-PAC PT "6 Clicks" Mobility  Outcome Measure Help needed turning from your back to your side while in a flat bed without using bedrails?: A Little Help needed moving from lying on your back to sitting on the side of a flat bed without using bedrails?: A Little Help needed moving to and from a bed to a chair (including a wheelchair)?: Total Help needed standing up from a chair using your arms (e.g., wheelchair or bedside chair)?: Total Help needed to walk in hospital room?: Total Help needed climbing 3-5 steps with a railing? : Total 6 Click Score: 10    End of Session   Activity Tolerance: Patient limited by fatigue Patient left: in bed;with call bell/phone within reach;with bed alarm set Nurse Communication: Mobility status PT Visit Diagnosis: Difficulty in walking, not elsewhere classified (R26.2)    Time: 4695-0722 PT Time Calculation (min) (ACUTE ONLY): 15 min   Charges:   PT Evaluation $PT Eval Moderate Complexity: 1 Mod          Philomena Doheny PT 05/07/2021  Acute Rehabilitation Services Pager [phone] Office [phone]

## 2021-05-07 NOTE — Consult Note (Signed)
Consult Note  Phillip Oconnor 1940/12/02  [id number].    Requesting MD: Dr. Terrilee Oconnor Chief Complaint/Reason for Consult: right psoas mass HPI:  Patient is an 80 year old male who presented to [facility] with right hip pain and RLE weakness. He reports this just started but is unclear on how long it has been present. Initially he tells me this started in early September and then later tells me it started in August. Patient denies recent trauma. He recently underwent open left inguinal hernia repair and has recovered well from that. Patient was found to have mass at that time and instructed to follow up with PCP but he has not been able to get there, he is unable to explain why he hasn't been able to get there. He did not show up for surgical follow up either. He is unable to characterize pain in right hip but tells me that it hurts when he puts weight on it. He also reports SOB but is unable to tell me if this is at rest or with exertion. He denied fever, chills, chest pain, urinary symptoms, nausea, vomiting, diarrhea or constipation. Patient denied new numbness or tingling. Patient reports he lives alone, per case manager he has a brother that is involved in his life. PMH otherwise significant for HTN, HLD, A. Fib, T2DM, Hx of CVA and Hx of seizures. Patient reports that he does not take eliquis although it is still listed as a home medication for him. Prior abdominal surgery includes left inguinal hernia repair as listed above and laparoscopic right inguinal hernia repair previously.  ROS: Review of Systems  Reason unable to perform ROS: ROS limited secondary to altered mental status.  Constitutional:  Negative for chills and fever.  Respiratory:  Positive for shortness of breath. Negative for cough and wheezing.   Cardiovascular:  Negative for chest pain and palpitations.  Gastrointestinal:  Negative for abdominal pain, constipation, diarrhea, nausea and vomiting.  Genitourinary:   Negative for dysuria, frequency and urgency.  Musculoskeletal:  Positive for joint pain (Right hip).   Family History  Problem Relation Age of Onset   Bleeding Disorder Neg Hx     Past Medical History:  Diagnosis Date   DM2 (diabetes mellitus, type 2) ([place])    HLD (hyperlipidemia)    HTN (hypertension)    Irregular heart beat    Seizures ([place])    Stroke [facility])     Past Surgical History:  Procedure Laterality Date   CORONARY ANGIOPLASTY WITH STENT PLACEMENT     INGUINAL HERNIA REPAIR Left 01/22/2021   Procedure: INGUINAL HERNIORRHAPHY;  Surgeon: Dwan Bolt, MD;  Location: [facility];  Service: General;  Laterality: Left;   INSERTION OF MESH  01/22/2021   Procedure: INSERTION OF MESH;  Surgeon: Dwan Bolt, MD;  Location: [facility];  Service: General;;   LOOP RECORDER IMPLANT      Social History:  reports that he has never smoked. He has never used smokeless tobacco. He reports that he does not drink alcohol and does not use drugs.  Allergies:  Allergies  Allergen Reactions   Penicillins Rash and Swelling    Lips swell   Insulin Lispro Itching   Plavix [Clopidogrel] Palpitations    Medications Prior to Admission  Medication Sig Dispense Refill   aspirin 325 MG tablet Take 325 mg by mouth daily.     atorvastatin (LIPITOR) 80 MG tablet Take 1 tablet (80 mg total) by mouth daily. 90 tablet  0   carvedilol (COREG) 3.125 MG tablet Take 1 tablet (3.125 mg total) by mouth 2 (two) times daily with a meal. 180 tablet 0   doxazosin (CARDURA) 4 MG tablet Take 4 mg by mouth daily.     glipiZIDE (GLUCOTROL) 10 MG tablet Take 10 mg by mouth in the morning and at bedtime.     levETIRAcetam (KEPPRA) 500 MG tablet Take 1 tablet (500 mg total) by mouth 2 (two) times daily. 180 tablet 0   metFORMIN (GLUCOPHAGE) 500 MG tablet Take 500 mg by mouth 2 (two) times daily.     Accu-Chek Softclix Lancets lancets Use as directed up to 6 times daily. 100 each 5   apixaban (ELIQUIS) 5 MG TABS tablet Take  1 tablet (5 mg total) by mouth 2 (two) times daily. (Patient not taking: No sig reported) 90 tablet 3   blood glucose meter kit and supplies Dispense based on patient and insurance preference. Use up to four times daily as directed. (FOR ICD-10 E10.9, E11.9). 1 each 0   cyanocobalamin (,VITAMIN B-12,) 1000 MCG/ML injection Inject 1 mL (1,000 mcg total) into the muscle every 30 (thirty) days. (Patient not taking: No sig reported) 1 mL 0   glucose blood test strip Use as instructed 100 each 12   hydrALAZINE (APRESOLINE) 50 MG tablet Take 1 tablet (50 mg total) by mouth 3 (three) times daily. (Patient not taking: Reported on 05/07/2021) 270 tablet 0   insulin aspart (NOVOLOG) 100 UNIT/ML FlexPen Inject 3 Units into the skin 3 (three) times daily with meals. (Patient not taking: No sig reported) 15 mL 0   insulin glargine (LANTUS) 100 UNIT/ML Solostar Pen Inject 5 Units into the skin daily. (Patient not taking: No sig reported) 15 mL 0   Insulin Pen Needle (PENTIPS) 32G X 4 MM MISC Use  with insulin, 4 (four) times daily - after meals and at bedtime. 100 each 0   linagliptin (TRADJENTA) 5 MG TABS tablet Take 1 tablet (5 mg total) by mouth daily. (Patient not taking: No sig reported) 90 tablet 0   NIFEdipine (PROCARDIA XL/NIFEDICAL-XL) 90 MG 24 hr tablet Take 90 mg by mouth daily. (Patient not taking: No sig reported)      Blood pressure 121/60, pulse 70, temperature 97.6 F (36.4 C), resp. rate 16, height 6' 4"  (1.93 m), weight 88.1 kg, SpO2 100 %. Physical Exam:  General: pleasant, WD, elderly male who is laying in bed in NAD HEENT: head is normocephalic, atraumatic.  Sclera are noninjected.  PERRL.  Ears and nose without any masses or lesions.  Mouth is pink and moist Heart: regular, rate, and rhythm.  Normal s1,s2. No obvious murmurs, gallops, or rubs noted.  Palpable radial and pedal pulses bilaterally Lungs: CTAB, no wheezes, rhonchi, or rales noted.  Respiratory effort nonlabored Abd: soft, NT,  ND, +BS, LIH repair scar is well healing MS: Mild ttp over right hip; mild to moderate edema of bilateral feet; BUE without edema  Skin: warm and dry with no masses, lesions, or rashes Neuro: sensation grossly intact in BLE, speech clear but slow Psych: seems somewhat confused, blunt affect   Results for orders placed or performed during the hospital encounter of 05/06/21 (from the past 48 hour(s))  CBC with Differential/Platelet     Status: Abnormal   Collection Time: 05/06/21  6:17 PM  Result Value Ref Range   WBC 9.7 4.0 - 10.5 K/uL   RBC 4.49 4.22 - 5.81 MIL/uL   Hemoglobin 13.4 13.0 -  17.0 g/dL   HCT 40.7 39.0 - 52.0 %   MCV 90.6 80.0 - 100.0 fL   MCH 29.8 26.0 - 34.0 pg   MCHC 32.9 30.0 - 36.0 g/dL   RDW 16.8 (H) 11.5 - 15.5 %   Platelets 171 150 - 400 K/uL   nRBC 0.0 0.0 - 0.2 %   Neutrophils Relative % 84 %   Neutro Abs 8.1 (H) 1.7 - 7.7 K/uL   Lymphocytes Relative 7 %   Lymphs Abs 0.6 (L) 0.7 - 4.0 K/uL   Monocytes Relative 9 %   Monocytes Absolute 0.9 0.1 - 1.0 K/uL   Eosinophils Relative 0 %   Eosinophils Absolute 0.0 0.0 - 0.5 K/uL   Basophils Relative 0 %   Basophils Absolute 0.0 0.0 - 0.1 K/uL   Immature Granulocytes 0 %   Abs Immature Granulocytes 0.04 0.00 - 0.07 K/uL    Comment: Performed at [facility], [address] [place], [place] [address]  Comprehensive metabolic panel     Status: Abnormal   Collection Time: 05/06/21  6:17 PM  Result Value Ref Range   Sodium 138 135 - 145 mmol/L   Potassium 4.4 3.5 - 5.1 mmol/L   Chloride 108 98 - 111 mmol/L   CO2 15 (L) 22 - 32 mmol/L   Glucose, Bld 171 (H) 70 - 99 mg/dL    Comment: Glucose reference range applies only to samples taken after fasting for at least 8 hours.   BUN 43 (H) 8 - 23 mg/dL   Creatinine, Ser 2.17 (H) 0.61 - 1.24 mg/dL   Calcium 9.3 8.9 - 10.3 mg/dL   Total Protein 6.8 6.5 - 8.1 g/dL   Albumin 3.0 (L) 3.5 - 5.0 g/dL   AST 14 (L) 15 - 41 U/L   ALT 9 0 - 44 U/L   Alkaline  Phosphatase 120 38 - 126 U/L   Total Bilirubin 0.8 0.3 - 1.2 mg/dL   GFR, Estimated 30 (L) >60 mL/min    Comment: (NOTE) Calculated using the CKD-EPI Creatinine Equation (2021)    Anion gap 15 5 - 15    Comment: Performed at [facility], [address] [place], [place] [address]  Lipase, blood     Status: None   Collection Time: 05/06/21  6:17 PM  Result Value Ref Range   Lipase 29 11 - 51 U/L    Comment: Performed at [facility], [address] [place], [place] [address]  CK     Status: Abnormal   Collection Time: 05/06/21  6:17 PM  Result Value Ref Range   Total CK 24 (L) 49 - 397 U/L    Comment: Performed at [facility], [address] [place], [place] [address]  Resp Panel by RT-PCR (Flu A&B, Covid) Nasopharyngeal Swab     Status: None   Collection Time: 05/06/21  6:17 PM   Specimen: Nasopharyngeal Swab; Nasopharyngeal(NP) swabs in vial transport medium  Result Value Ref Range   SARS Coronavirus 2 by RT PCR NEGATIVE NEGATIVE    Comment: (NOTE) SARS-CoV-2 target nucleic acids are NOT DETECTED.  The SARS-CoV-2 RNA is generally detectable in upper respiratory specimens during the acute phase of infection. The lowest concentration of SARS-CoV-2 viral copies this assay can detect is 138 copies/mL. A negative result does not preclude SARS-Cov-2 infection and should not be used as the sole basis for treatment or other patient management decisions. A negative  result may occur with  improper specimen collection/handling, submission of specimen other than nasopharyngeal swab, presence of viral mutation(s) within the areas targeted by this assay, and inadequate number of viral copies(<138 copies/mL). A negative result must be combined with clinical observations, patient history, and epidemiological information. The expected result is Negative.  Fact Sheet for Patients:  EntrepreneurPulse.com.au  Fact Sheet for Healthcare  Providers:  IncredibleEmployment.be  This test is no t yet approved or cleared by the [place] FDA and  has been authorized for detection and/or diagnosis of SARS-CoV-2 by FDA under an Emergency Use Authorization (EUA). This EUA will remain  in effect (meaning this test can be used) for the duration of the COVID-19 declaration under Section 564(b)(1) of the Act, 21 U.S.C.section 360bbb-3(b)(1), unless the authorization is terminated  or revoked sooner.       Influenza A by PCR NEGATIVE NEGATIVE   Influenza B by PCR NEGATIVE NEGATIVE    Comment: (NOTE) The Xpert Xpress SARS-CoV-2/FLU/RSV plus assay is intended as an aid in the diagnosis of influenza from Nasopharyngeal swab specimens and should not be used as a sole basis for treatment. Nasal washings and aspirates are unacceptable for Xpert Xpress SARS-CoV-2/FLU/RSV testing.  Fact Sheet for Patients: EntrepreneurPulse.com.au  Fact Sheet for Healthcare Providers: IncredibleEmployment.be  This test is not yet approved or cleared by the [place] FDA and has been authorized for detection and/or diagnosis of SARS-CoV-2 by FDA under an Emergency Use Authorization (EUA). This EUA will remain in effect (meaning this test can be used) for the duration of the COVID-19 declaration under Section 564(b)(1) of the Act, 21 U.S.C. section 360bbb-3(b)(1), unless the authorization is terminated or revoked.  Performed at [facility], [address] [place], [place] [address]   CBG monitoring, ED     Status: Abnormal   Collection Time: 05/06/21  9:08 PM  Result Value Ref Range   Glucose-Capillary 163 (H) 70 - 99 mg/dL    Comment: Glucose reference range applies only to samples taken after fasting for at least 8 hours.  Basic metabolic panel     Status: Abnormal   Collection Time: 05/07/21  4:10 AM  Result Value Ref Range   Sodium 138 135 - 145 mmol/L   Potassium  4.5 3.5 - 5.1 mmol/L   Chloride 111 98 - 111 mmol/L   CO2 15 (L) 22 - 32 mmol/L   Glucose, Bld 173 (H) 70 - 99 mg/dL    Comment: Glucose reference range applies only to samples taken after fasting for at least 8 hours.   BUN 48 (H) 8 - 23 mg/dL   Creatinine, Ser 2.08 (H) 0.61 - 1.24 mg/dL   Calcium 8.9 8.9 - 10.3 mg/dL   GFR, Estimated 31 (L) >60 mL/min    Comment: (NOTE) Calculated using the CKD-EPI Creatinine Equation (2021)    Anion gap 12 5 - 15    Comment: Performed at [facility], [place] [address], [place], [place] [address]  CBC     Status: Abnormal   Collection Time: 05/07/21  4:10 AM  Result Value Ref Range   WBC 9.7 4.0 - 10.5 K/uL   RBC 3.93 (L) 4.22 - 5.81 MIL/uL   Hemoglobin 11.5 (L) 13.0 - 17.0 g/dL   HCT 36.2 (L) 39.0 - 52.0 %   MCV 92.1 80.0 - 100.0 fL   MCH 29.3 26.0 - 34.0 pg   MCHC 31.8 30.0 - 36.0 g/dL   RDW 16.8 (H) 11.5 -  15.5 %   Platelets 155 150 - 400 K/uL   nRBC 0.0 0.0 - 0.2 %    Comment: Performed at [facility], [place] [address] [place], [place] [address]  Glucose, capillary     Status: Abnormal   Collection Time: 05/07/21 11:58 AM  Result Value Ref Range   Glucose-Capillary 176 (H) 70 - 99 mg/dL    Comment: Glucose reference range applies only to samples taken after fasting for at least 8 hours.   CT ABDOMEN PELVIS WO CONTRAST  Result Date: 05/06/2021 CLINICAL DATA:  Abdominal distension.  Right hip pain. EXAM: CT ABDOMEN AND PELVIS WITHOUT CONTRAST TECHNIQUE: Multidetector CT imaging of the abdomen and pelvis was performed following the standard protocol without IV contrast. COMPARISON:  CT abdomen and pelvis 01/22/2021. FINDINGS: Lower chest: There are new small bilateral pleural effusions. There is atelectasis in the bilateral lung bases. Hepatobiliary: Exophytic lesion from the posteromedial right lobe of the liver measuring 14 mm is unchanged. No definite new liver lesions are identified. There is hyperdensity  within the gallbladder which may represent gallbladder sludge. There is no biliary ductal dilatation. Pancreas: Unremarkable. No pancreatic ductal dilatation or surrounding inflammatory changes. Spleen: Normal in size without focal abnormality. Adrenals/Urinary Tract: The bilateral adrenal glands are within normal limits. There is no hydronephrosis no urinary tract calculi are seen. There is a left renal cyst measuring 2 cm which is unchanged. There is a stable right renal cyst measuring 2.6 cm. Bilateral perinephric fat stranding is unchanged. Focal calcifications in the right Peri renal fat are stable from the prior examination. The bladder is within normal limits. Stomach/Bowel: Stomach is within normal limits. Appendix appears normal. No evidence of bowel wall thickening, distention, or inflammatory changes. Vascular/Lymphatic: There are atherosclerotic calcifications of the aorta. The aorta is normal in size. No discrete enlarged lymph nodes are identified. Reproductive: Prostate is unremarkable. Other: Again seen is heterogeneous soft tissue mass containing some calcifications in the right inferior psoas region. The mass has significantly increased in size now measuring 9.2 by 7.9 by 10.2 cm. There is mild surrounding inflammatory stranding, unchanged. There is new small volume ascites with new diffuse haziness of mesentery. There is no free intraperitoneal air. No significant hernia. There is a mesh along the anterior inferior abdominal wall. There is also new diffuse body wall edema. Musculoskeletal: Multilevel degenerative changes affect the spine. IMPRESSION: 1. Soft tissue mass in the right inferior psoas region has significantly increased in size now measuring up to 10.2 cm. 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 2. New small volume ascites. 3. New diffuse body wall edema. 4. New small bilateral pleural effusions. 5. Unchanged exophytic lesion from the right lobe of the liver, indeterminate. This can be  further characterized with MRI. 6. Gallbladder sludge. 7. Stable nonspecific bilateral perinephric fat stranding. Correlate for medical renal disease or infection. 8. Aortic Atherosclerosis (ICD10-I70.0). Electronically Signed   By: Ronney Asters M.D.   On: 05/06/2021 20:09   CT HEAD WO CONTRAST (5MM)  Result Date: 05/06/2021 CLINICAL DATA:  Mental status change of unknown cause. Unable to ambulate for 4 days. Recent hernia surgery. EXAM: CT HEAD WITHOUT CONTRAST TECHNIQUE: Contiguous axial images were obtained from the base of the skull through the vertex without intravenous contrast. COMPARISON:  11/19/2020 FINDINGS: Brain: Large area of encephalomalacia in the right frontal lobe consistent with old infarct. This is unchanged since prior study. No mass effect or midline shift. No abnormal extra-axial fluid collections. No significant ventricular dilatation. Basal  cisterns are not effaced. No acute intracranial hemorrhage. Vascular: Moderate intracranial arterial vascular calcifications. Skull: Calvarium appears intact. Sinuses/Orbits: Mucosal thickening in the paranasal sinuses. No acute air-fluid levels. Mastoid air cells are clear. Other: None. IMPRESSION: No acute intracranial abnormalities. 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 Electronically Signed   By: Lucienne Capers M.D.   On: 05/06/2021 20:00   DG Chest Port 1 View  Result Date: 05/06/2021 CLINICAL DATA:  Weakness EXAM: PORTABLE CHEST 1 VIEW COMPARISON:  11/19/2020 FINDINGS: The heart size and mediastinal contours are within normal limits. No focal pulmonary opacity. Previously noted small pleural effusions are no longer seen. No acute osseous abnormality. IMPRESSION: No active disease. Electronically Signed   By: Merilyn Baba M.D.   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

## 2021-05-07 NOTE — TOC Initial Note (Signed)
Transition of Care [facility]) - Initial/Assessment Note    Patient Details  Name: Phillip Oconnor MRN: [id number] Date of Birth: August 01, 1941  Transition of Care [facility]) CM/SW Contact:    Dessa Phi, RN Phone Number: 05/07/2021, 2:46 PM  Clinical Narrative:  Patient lethargic. Spoke to brother on phone Bruce about d/c plans-patient from home alone, A+0x3, Indep w/ADL's PTA. Noted PT recc SNF-will await patient more alert to discuss recc.Nored R hip mass, further w/u.                Expected Discharge Plan: Skilled Nursing Facility Barriers to Discharge: Continued Medical Work up   Patient Goals and CMS Choice Patient states their goals for this hospitalization and ongoing recovery are:: go home CMS Medicare.gov Compare Post Acute Care list provided to:: Patient Represenative (must comment) [name] brother [phone])    Expected Discharge Plan and Services Expected Discharge Plan: [place]   Discharge Planning Services: CM Consult Post Acute Care Choice: [place] Living arrangements for the past 2 months: Single Family Home                                      Prior Living Arrangements/Services Living arrangements for the past 2 months: Single Family Home Lives with:: Self Patient language and need for interpreter reviewed:: Yes Do you feel safe going back to the place where you live?: Yes      Need for Family Participation in Patient Care: No (Comment)        Activities of Daily Living Home Assistive Devices/Equipment: None ADL Screening (condition at time of admission) Patient's cognitive ability adequate to safely complete daily activities?: No Is the patient deaf or have difficulty hearing?: No Does the patient have difficulty seeing, even when wearing glasses/contacts?: No Does the patient have difficulty concentrating, remembering, or making decisions?: No Patient able to express need for assistance with ADLs?: Yes Does the  patient have difficulty dressing or bathing?: No Independently performs ADLs?: Yes (appropriate for developmental age) Does the patient have difficulty walking or climbing stairs?: Yes Weakness of Legs: Both Weakness of Arms/Hands: None  Permission Sought/Granted Permission sought to share information with : Case Manager Permission granted to share information with : Yes, Verbal Permission Granted  Share Information with NAME: Case Manager     Permission granted to share info w RelationshipDarnell Level brother [phone]     Emotional Assessment Appearance:: Appears stated age Attitude/Demeanor/Rapport: Lethargic Affect (typically observed): Unable to Assess Orientation: : Oriented to Self, Oriented to Place, Oriented to  Time, Oriented to Situation Alcohol / Substance Use: Not Applicable Psych Involvement: No (comment)  Admission diagnosis:  Psoas mass [M62.89] 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] AKI (acute kidney injury) ([place]) [N17.9] Patient Active Problem List   Diagnosis Date Noted   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/06/2021   Hernia, inguinal 01/23/2021   Inguinal hernia with incarceration 01/23/2021   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 [place] [address] Phone: [phone] Fax: [phone]     Social Determinants  of Health (SDOH) Interventions    Readmission Risk Interventions No flowsheet data found.

## 2021-05-07 NOTE — Plan of Care (Signed)
  Problem: Education: Goal: Knowledge of General Education information will improve Description: Including pain rating scale, medication(s)/side effects and non-pharmacologic comfort measures Outcome: Progressing   Problem: Clinical Measurements: Goal: Ability to maintain clinical measurements within normal limits will improve Outcome: Progressing Goal: Respiratory complications will improve Outcome: Progressing Goal: Cardiovascular complication will be avoided Outcome: Progressing   Problem: Coping: Goal: Level of anxiety will decrease Outcome: Progressing   Problem: Pain Managment: Goal: General experience of comfort will improve Outcome: Progressing   

## 2021-05-08 DIAGNOSIS — M6289 Other specified disorders of muscle: Secondary | ICD-10-CM | POA: Diagnosis not present

## 2021-05-08 LAB — BASIC METABOLIC PANEL
Anion gap: 7 (ref 5–15)
BUN: 46 mg/dL — ABNORMAL HIGH (ref 8–23)
CO2: 16 mmol/L — ABNORMAL LOW (ref 22–32)
Calcium: 8.5 mg/dL — ABNORMAL LOW (ref 8.9–10.3)
Chloride: 114 mmol/L — ABNORMAL HIGH (ref 98–111)
Creatinine, Ser: 2.02 mg/dL — ABNORMAL HIGH (ref 0.61–1.24)
GFR, Estimated: 32 mL/min — ABNORMAL LOW (ref >60)
Glucose, Bld: 157 mg/dL — ABNORMAL HIGH (ref 70–99)
Potassium: 4.1 mmol/L (ref 3.5–5.1)
Sodium: 137 mmol/L (ref 135–145)

## 2021-05-08 LAB — CBC WITH DIFFERENTIAL/PLATELET
Abs Immature Granulocytes: 0.01 10*3/uL (ref 0.00–0.07)
Basophils Absolute: 0 10*3/uL (ref 0.0–0.1)
Basophils Relative: 0 %
Eosinophils Absolute: 0 10*3/uL (ref 0.0–0.5)
Eosinophils Relative: 0 %
HCT: 34 % — ABNORMAL LOW (ref 39.0–52.0)
Hemoglobin: 11.2 g/dL — ABNORMAL LOW (ref 13.0–17.0)
Immature Granulocytes: 0 %
Lymphocytes Relative: 12 %
Lymphs Abs: 0.7 10*3/uL (ref 0.7–4.0)
MCH: 30.2 pg (ref 26.0–34.0)
MCHC: 32.9 g/dL (ref 30.0–36.0)
MCV: 91.6 fL (ref 80.0–100.0)
Monocytes Absolute: 0.7 10*3/uL (ref 0.1–1.0)
Monocytes Relative: 12 %
Neutro Abs: 4.5 10*3/uL (ref 1.7–7.7)
Neutrophils Relative %: 76 %
Platelets: 167 10*3/uL (ref 150–400)
RBC: 3.71 MIL/uL — ABNORMAL LOW (ref 4.22–5.81)
RDW: 17 % — ABNORMAL HIGH (ref 11.5–15.5)
WBC: 5.9 10*3/uL (ref 4.0–10.5)
nRBC: 0 % (ref 0.0–0.2)

## 2021-05-08 LAB — GLUCOSE, CAPILLARY
Glucose-Capillary: 137 mg/dL — ABNORMAL HIGH (ref 70–99)
Glucose-Capillary: 160 mg/dL — ABNORMAL HIGH (ref 70–99)
Glucose-Capillary: 128 mg/dL — ABNORMAL HIGH (ref 70–99)

## 2021-05-08 LAB — HEMOGLOBIN A1C
Hgb A1c MFr Bld: 6.8 % — ABNORMAL HIGH (ref 4.8–5.6)
Mean Plasma Glucose: 148 mg/dL

## 2021-05-08 NOTE — Plan of Care (Signed)
  Problem: Activity: Goal: Risk for activity intolerance will decrease Outcome: Progressing   Problem: Nutrition: Goal: Adequate nutrition will be maintained Outcome: Progressing   Problem: Pain Managment: Goal: General experience of comfort will improve Outcome: Progressing   Problem: Elimination: Goal: Will not experience complications related to bowel motility Outcome: Progressing Goal: Will not experience complications related to urinary retention Outcome: Progressing

## 2021-05-08 NOTE — Progress Notes (Signed)
PROGRESS NOTE  Phillip Oconnor  DOB: [birth year]  PCP: Phillip Lucks, PA-C ERD:[id number]  DOA: 05/06/2021  LOS: 2 days  Hospital Day: 3   Chief Complaint  Patient presents with   Hip Pain    Brief narrative: Phillip Oconnor is a 80 y.o. male with PMH significant for DM2, HTN, HLD, stroke, seizure, chronic bilateral lower extremity edema who lives alone at home. Patient presented to the ED on 9/20 with progressively worsening weakness of right lower extremity and right hip pain with difficulty ambulation for 3 to 4 days.  He noticed right hip pain the day before he had a hernia surgery on 01/23/2021.   Of note, CT scan of abdomen and pelvis done on 01/23/2019 had shown right psoas muscle mass.  Patient was recommended to follow-up with surgeon Dr. Zenia Resides as an outpatient which he missed.  In the ED, blood pressure was elevated 169/63 Labs with unremarkable CBC, serum bicarb low at 15, creatinine elevated to 2.17. CT abdomen pelvis showed 1. Soft tissue mass in the right inferior psoas region has significantly increased in size measuring up to 10.2 cm when compared to CT scan from 3 months ago. 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 2. New small volume ascites, diffuse body wall edema and small bilateral pleural effusions.  Patient was admitted to hospitalist service for further evaluation management.  Subjective: Patient was seen and examined this morning.   Propped up in bed.  Not in distress.  Seems slow to respond today.   Imaging from last 24 hours reviewed.  Assessment/Plan: Right inferior psoas muscle mass  -Case discussed with oncologist Dr. Earlie Server and surgeon Dr. Zenia Resides.  Core biopsy was not recommended because of the possibility of it being a sarcoma.  Noncontrast CT scan and MRI were obtained but the mass could not be characterized properly.   -We will increase IV hydration, hoping to improve his creatinine down to baseline.  We will plan for contrast study after  that.    AKI on CKD 3a -Baseline creatinine less than 1.5.  Presented with a creatinine of 2.17. -Gradually improving with IV fluid.  Increase IV fluid rate.  Continue to monitor. Recent Labs    11/21/20 0340 11/21/20 1610 11/22/20 0338 11/23/20 0219 11/24/20 1008 01/22/21 1304 01/23/21 0417 05/06/21 1817 05/07/21 0410 05/08/21 0450  BUN 16 16 15 12 10 15 14  43* 48* 46*  CREATININE 1.60* 1.47* 1.43* 1.50* 1.46* 1.39* 1.32* 2.17* 2.08* 2.02*    Uncontrolled type 2 diabetes mellitus -A1c was 9.6 on April 2022.  It seems to have improved to 6.8 on repeat test this admission.  Probably due to poor appetite. -Home meds include glipizide 10 mg twice daily, metformin 500 mg twice daily.  Apparently was not taking Tradjenta and insulin at home. -Currently oral meds on hold.  Blood sugar is consistently less than 150 with sliding scale insulin only. Recent Labs  Lab 05/06/21 2108 05/07/21 1158 05/07/21 1646 05/07/21 2131 05/08/21 0806  GLUCAP 163* 176* 137* 135* 137*    Essential hypertension -Home meds include Coreg 3.125 mg twice daily, doxazosin 4 mg daily  -Continue the same. -Continue to monitor blood pressure  Dyslipidemia History of stroke -Continue aspirin 325 daily, Lipitor 80 mg daily  History of seizure disorder -Continue Keppra 5 mg twice daily  Mobility: Impaired mobility.  PT eval ordered. Code Status:   Code Status: Full Code  Nutritional status: Body mass index is 23.64 kg/m.     Diet:  Diet  Order             Diet heart healthy/carb modified Room service appropriate? Yes; Fluid consistency: Thin  Diet effective now                  DVT prophylaxis:  enoxaparin (LOVENOX) injection 40 mg Start: 05/07/21 1000   Antimicrobials: None Fluid: Normal saline at 125 mill per hour Consultants: General surgery, oncology Family Communication: None at bedside  Status is: Inpatient  Remains inpatient appropriate because: Ongoing work-up for  potential malignancy  Dispo: The patient is from: Home              Anticipated d/c is to: Pending clinical course              Patient currently is not medically stable to d/c.   Difficult to place patient No     Infusions:   sodium chloride 100 mL/hr at 05/07/21 2136    Scheduled Meds:  atorvastatin  80 mg Oral Daily   carvedilol  3.125 mg Oral BID WC   doxazosin  4 mg Oral Daily   enoxaparin (LOVENOX) injection  40 mg Subcutaneous Q24H   insulin aspart  0-5 Units Subcutaneous QHS   insulin aspart  0-9 Units Subcutaneous TID WC   levETIRAcetam  500 mg Oral BID    Antimicrobials: Anti-infectives (From admission, onward)    None       PRN meds: acetaminophen **OR** acetaminophen, magnesium hydroxide, morphine injection, ondansetron (ZOFRAN) IV, ondansetron **OR** ondansetron (ZOFRAN) IV, oxyCODONE-acetaminophen, traZODone   Objective: Vitals:   05/08/21 0628 05/08/21 0959  BP: (!) 125/58   Pulse: 87 68  Resp:  16  Temp: 97.6 F (36.4 C)   SpO2: 100% 100%    Intake/Output Summary (Last 24 hours) at 05/08/2021 1054 Last data filed at 05/08/2021 0600 Gross per 24 hour  Intake 1442.23 ml  Output 700 ml  Net 742.23 ml    Filed Weights   05/06/21 1746 05/06/21 2342  Weight: 90.2 kg 88.1 kg   Weight change:  Body mass index is 23.64 kg/m.   Physical Exam: General exam: Pleasant elderly African-American male.  Not in physical pain Skin: No rashes, lesions or ulcers. HEENT: Atraumatic, normocephalic, no obvious bleeding Lungs: Clear to auscultation bilaterally CVS: Regular rate and rhythm, no murmur GI/Abd soft, tenderness present in right lower abdomen, nondistended, bowel sound present. CNS: Alert, awake, slow to respond.  Not restless or agitated. Psychiatry: Depressed look today. Extremities: No pedal edema, no calf tenderness  Data Review: I have personally reviewed the laboratory data and studies available.  Recent Labs  Lab 05/06/21 1817  05/07/21 0410 05/08/21 0450  WBC 9.7 9.7 5.9  NEUTROABS 8.1*  --  4.5  HGB 13.4 11.5* 11.2*  HCT 40.7 36.2* 34.0*  MCV 90.6 92.1 91.6  PLT 171 155 167    Recent Labs  Lab 05/06/21 1817 05/07/21 0410 05/08/21 0450  NA 138 138 137  K 4.4 4.5 4.1  CL 108 111 114*  CO2 15* 15* 16*  GLUCOSE 171* 173* 157*  BUN 43* 48* 46*  CREATININE 2.17* 2.08* 2.02*  CALCIUM 9.3 8.9 8.5*     F/u labs ordered Unresulted Labs (From admission, onward)     Start     Ordered   05/08/21 0500  CBC with Differential/Platelet  Daily,   R      05/07/21 1354   05/08/21 1062  Basic metabolic panel  Daily,   R  05/07/21 1354            Signed, Terrilee Croak, MD [facility] 05/08/2021

## 2021-05-08 NOTE — NC FL2 (Signed)
[place] MEDICAID FL2 LEVEL OF CARE SCREENING TOOL     IDENTIFICATION  Patient Name: Phillip Oconnor Birthdate: 09/20/1940 Sex: male Admission Date (Current Location): 05/06/2021  [facility] and [place] Number:  Herbalist and Address:  [facility],  [place], [place]      Provider Number: [id number]  Attending Physician Name and Address:  Terrilee Croak, MD  Relative Name and Phone Number:  Selassie Spatafore brother [phone]    Current Level of Care: Hospital Recommended Level of Care: [place] Prior Approval Number:    Date Approved/Denied:   PASRR Number: [id number]  Discharge Plan: SNF    Current Diagnoses: Patient Active Problem List   Diagnosis Date Noted   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/06/2021   Hernia, inguinal 01/23/2021   Inguinal hernia with incarceration 01/23/2021   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 ([place])    Precordial pain    Atherosclerosis of native coronary artery of native heart without angina pectoris    Hx of heart artery stent    Long term (current) use of anticoagulants    Atrial flutter, paroxysmal ([place]) 11/20/2020   DM2 (diabetes mellitus, type 2) ([place]) 11/20/2020   HTN (hypertension) 11/20/2020   History of multiple strokes 11/20/2020   Seizure ([place]) 11/20/2020    Orientation RESPIRATION BLADDER Height & Weight     Self, Time, Situation  Normal Continent Weight: 88.1 kg Height:  6\' 4"  (193 cm)  BEHAVIORAL SYMPTOMS/MOOD NEUROLOGICAL BOWEL NUTRITION STATUS    Convulsions/Seizures Continent Diet (CHO MOD)  AMBULATORY STATUS COMMUNICATION OF NEEDS Skin     Verbally Normal                       Personal Care Assistance Level of Assistance  Bathing, Dressing Bathing Assistance: Limited assistance   Dressing Assistance: Limited assistance     Functional Limitations Info  Sight, Hearing, Speech Sight Info: Adequate Hearing Info: Adequate Speech Info:  Adequate    SPECIAL CARE FACTORS FREQUENCY  PT (By licensed PT), OT (By licensed OT)     PT Frequency:  (5x week) OT Frequency:  (5x week)            Contractures      Additional Factors Info  Code Status, Allergies, Psychotropic Code Status Info:  (Full) Allergies Info:  (Penicillins;insulin Lispro,plavix) Psychotropic Info:  (Trazadone prn see MAR)         Current Medications (05/08/2021):  This is the current hospital active medication list Current Facility-Administered Medications  Medication Dose Route Frequency Provider Last Rate Last Admin   0.9 %  sodium chloride infusion   Intravenous Continuous Dahal, Binaya, MD 125 mL/hr at 05/08/21 1251 New Bag at 05/08/21 1251   acetaminophen (TYLENOL) tablet 650 mg  650 mg Oral Q6H PRN Mansy, Jan A, MD       Or   acetaminophen (TYLENOL) suppository 650 mg  650 mg Rectal Q6H PRN Mansy, Jan A, MD       atorvastatin (LIPITOR) tablet 80 mg  80 mg Oral Daily Mansy, Jan A, MD   80 mg at 05/08/21 0925   carvedilol (COREG) tablet 3.125 mg  3.125 mg Oral BID WC Mansy, Jan A, MD   3.125 mg at 05/08/21 0925   doxazosin (CARDURA) tablet 4 mg  4 mg Oral Daily Mansy, Jan A, MD   4 mg at 05/08/21 0925   enoxaparin (LOVENOX) injection 40 mg  40 mg Subcutaneous Q24H Mansy, Jan A, MD   40 mg at 05/08/21 5732   insulin aspart (novoLOG) injection 0-5 Units  0-5 Units Subcutaneous QHS Dahal, Binaya, MD       insulin aspart (novoLOG) injection 0-9 Units  0-9 Units Subcutaneous TID WC Dahal, Marlowe Aschoff, MD   2 Units at 05/08/21 1325   levETIRAcetam (KEPPRA) tablet 500 mg  500 mg Oral BID Mansy, Jan A, MD   500 mg at 05/08/21 2025   magnesium hydroxide (MILK OF MAGNESIA) suspension 30 mL  30 mL Oral Daily PRN Mansy, Jan A, MD       morphine 2 MG/ML injection 2 mg  2 mg Intravenous Q4H PRN Mansy, Jan A, MD   2 mg at 05/08/21 1325   ondansetron (ZOFRAN) injection 4 mg  4 mg Intravenous Q4H PRN Mansy, Jan A, MD       ondansetron [facility]) tablet 4 mg  4 mg  Oral Q6H PRN Mansy, Jan A, MD       Or   ondansetron [facility]) injection 4 mg  4 mg Intravenous Q6H PRN Mansy, Jan A, MD       oxyCODONE-acetaminophen (PERCOCET/ROXICET) 5-325 MG per tablet 1 tablet  1 tablet Oral Q6H PRN Terrilee Croak, MD   1 tablet at 05/07/21 2148   traZODone (DESYREL) tablet 25 mg  25 mg Oral QHS PRN Mansy, Arvella Merles, MD         Discharge Medications: Please see discharge summary for a list of discharge medications.  Relevant Imaging Results:  Relevant Lab Results:   Additional Information ss#[id number] [place] x2,no Booster decline to receive 1 while in hospital  Hazem Kenner, [place], [place]

## 2021-05-08 NOTE — Progress Notes (Addendum)
Progress Note     Subjective: Patient denies pain this AM but states "I'm tired. I just want to relax."  Objective: Vital signs in last 24 hours: Temp:  [97.6 F (36.4 C)-97.9 F (36.6 C)] 97.6 F (36.4 C) (09/22 0628) Pulse Rate:  [69-89] 87 (09/22 0628) Resp:  [16] 16 (09/21 1115) BP: (121-128)/(58-80) 125/58 (09/22 0628) SpO2:  [98 %-100 %] 100 % (09/22 0628) Last BM Date: 05/06/21 (per pt)  Intake/Output from previous day: 09/21 0701 - 09/22 0700 In: 1442.2 [P.O.:540; I.V.:902.2] Out: 700 [Urine:700] Intake/Output this shift: No intake/output data recorded.  PE: General: pleasant, WD, elderly male who is laying in bed in NAD Heart: regular, rate, and rhythm.  Lungs: CTAB, no wheezes, rhonchi, or rales noted.  Respiratory effort nonlabored Abd: soft, NT, ND MS: Mild ttp over right hip Psych: seems somewhat confused, blunt affect   Lab Results:  Recent Labs    05/07/21 0410 05/08/21 0450  WBC 9.7 5.9  HGB 11.5* 11.2*  HCT 36.2* 34.0*  PLT 155 167   BMET Recent Labs    05/07/21 0410 05/08/21 0450  NA 138 137  K 4.5 4.1  CL 111 114*  CO2 15* 16*  GLUCOSE 173* 157*  BUN 48* 46*  CREATININE 2.08* 2.02*  CALCIUM 8.9 8.5*   PT/INR No results for input(s): LABPROT, INR in the last 72 hours. CMP     Component Value Date/Time   NA 137 05/08/2021 0450   K 4.1 05/08/2021 0450   CL 114 (H) 05/08/2021 0450   CO2 16 (L) 05/08/2021 0450   GLUCOSE 157 (H) 05/08/2021 0450   BUN 46 (H) 05/08/2021 0450   CREATININE 2.02 (H) 05/08/2021 0450   CALCIUM 8.5 (L) 05/08/2021 0450   PROT 6.8 05/06/2021 1817   ALBUMIN 3.0 (L) 05/06/2021 1817   AST 14 (L) 05/06/2021 1817   ALT 9 05/06/2021 1817   ALKPHOS 120 05/06/2021 1817   BILITOT 0.8 05/06/2021 1817   GFRNONAA 32 (L) 05/08/2021 0450   Lipase     Component Value Date/Time   LIPASE 29 05/06/2021 1817       Studies/Results: CT ABDOMEN PELVIS WO CONTRAST  Result Date: 05/06/2021 CLINICAL DATA:   Abdominal distension.  Right hip pain. EXAM: CT ABDOMEN AND PELVIS WITHOUT CONTRAST TECHNIQUE: Multidetector CT imaging of the abdomen and pelvis was performed following the standard protocol without IV contrast. COMPARISON:  CT abdomen and pelvis 01/22/2021. FINDINGS: Lower chest: There are new small bilateral pleural effusions. There is atelectasis in the bilateral lung bases. Hepatobiliary: Exophytic lesion from the posteromedial right lobe of the liver measuring 14 mm is unchanged. No definite new liver lesions are identified. There is hyperdensity within the gallbladder which may represent gallbladder sludge. There is no biliary ductal dilatation. Pancreas: Unremarkable. No pancreatic ductal dilatation or surrounding inflammatory changes. Spleen: Normal in size without focal abnormality. Adrenals/Urinary Tract: The bilateral adrenal glands are within normal limits. There is no hydronephrosis no urinary tract calculi are seen. There is a left renal cyst measuring 2 cm which is unchanged. There is a stable right renal cyst measuring 2.6 cm. Bilateral perinephric fat stranding is unchanged. Focal calcifications in the right Peri renal fat are stable from the prior examination. The bladder is within normal limits. Stomach/Bowel: Stomach is within normal limits. Appendix appears normal. No evidence of bowel wall thickening, distention, or inflammatory changes. Vascular/Lymphatic: There are atherosclerotic calcifications of the aorta. The aorta is normal in size. No discrete enlarged lymph nodes  are identified. Reproductive: Prostate is unremarkable. Other: Again seen is heterogeneous soft tissue mass containing some calcifications in the right inferior psoas region. The mass has significantly increased in size now measuring 9.2 by 7.9 by 10.2 cm. There is mild surrounding inflammatory stranding, unchanged. There is new small volume ascites with new diffuse haziness of mesentery. There is no free intraperitoneal air.  No significant hernia. There is a mesh along the anterior inferior abdominal wall. There is also new diffuse body wall edema. Musculoskeletal: Multilevel degenerative changes affect the spine. IMPRESSION: 1. Soft tissue mass in the right inferior psoas region has significantly increased in size now measuring up to 10.2 cm. 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 2. New small volume ascites. 3. New diffuse body wall edema. 4. New small bilateral pleural effusions. 5. Unchanged exophytic lesion from the right lobe of the liver, indeterminate. This can be further characterized with MRI. 6. Gallbladder sludge. 7. Stable nonspecific bilateral perinephric fat stranding. Correlate for medical renal disease or infection. 8. Aortic Atherosclerosis (ICD10-I70.0). Electronically Signed   By: Ronney Asters M.D.   On: 05/06/2021 20:09   CT HEAD WO CONTRAST (5MM)  Result Date: 05/06/2021 CLINICAL DATA:  Mental status change of unknown cause. Unable to ambulate for 4 days. Recent hernia surgery. EXAM: CT HEAD WITHOUT CONTRAST TECHNIQUE: Contiguous axial images were obtained from the base of the skull through the vertex without intravenous contrast. COMPARISON:  11/19/2020 FINDINGS: Brain: Large area of encephalomalacia in the right frontal lobe consistent with old infarct. This is unchanged since prior study. No mass effect or midline shift. No abnormal extra-axial fluid collections. No significant ventricular dilatation. Basal cisterns are not effaced. No acute intracranial hemorrhage. Vascular: Moderate intracranial arterial vascular calcifications. Skull: Calvarium appears intact. Sinuses/Orbits: Mucosal thickening in the paranasal sinuses. No acute air-fluid levels. Mastoid air cells are clear. Other: None. IMPRESSION: No acute intracranial abnormalities. 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 Electronically Signed   By: Lucienne Capers M.D.   On: 05/06/2021 20:00   MR PELVIS WO  CONTRAST  Result Date: 05/08/2021 CLINICAL DATA:  Right lower quadrant mass EXAM: MRI ABDOMEN AND PELVIS WITHOUT CONTRAST TECHNIQUE: Multiplanar multisequence MR imaging of the abdomen and pelvis was performed. No intravenous contrast was administered. COMPARISON:  CT from 05/06/2021 FINDINGS: COMBINED FINDINGS FOR BOTH MR ABDOMEN AND PELVIS Lower chest: Not visualized Hepatobiliary: Not visualized Pancreas:  Not visualized Spleen:  Not visualized Adrenals/Urinary Tract: Nonvisualized. Inferior pole of both kidneys are included within this exam. No hydronephrosis identified. Several small cysts noted within the mid and inferior pole of the scratch set several small cysts are partially visualized. There is a small polypoid filling defect within the right inferior bladder wall measuring 3 mm, image 15/2. Stomach/Bowel: No dilated bowel loops. The visualized bowel loops are normal in caliber. No signs of bowel wall thickening. Vascular/Lymphatic: Aortic atherosclerosis. No abdominopelvic adenopathy. Reproductive: Prostate gland enlargement. Other: Again seen is a large, heterogeneous mass containing calcifications involving the inferior right iliopsoas muscle. This measures 11.4 by 8.2 by 8.9 cm. There is heterogeneous increased T2 signal and fairly homogeneous T1 isointense signal. This is incompletely characterized without IV contrast material. Although similar in size to the study from 05/06/2021 there has been significant interval increase in size of mass compared with 01/22/2021. Small volume of free fluid noted within the dependent pelvis. Diffuse soft tissue edema is identified compatible with anasarca. Musculoskeletal: Heterogeneous appearance of the bone marrow is nonspecific. IMPRESSION: 1. Large heterogeneous  mass containing calcifications involving the inferior right iliopsoas muscle is similar in size to the study from 05/06/2021. This is incompletely characterized without IV contrast material.  Differential considerations include benign and malignant neoplasm. Alternatively, a chronic hematoma may have a similar appearance. In the absence of signs or symptoms of infection, abscess is considered less favored. More definitive characterization with contrast enhanced cross-sectional imaging is recommended to assess the enhancement characteristics of this indeterminate mass. In a patient who may have difficulty with breath hold technique and/or remaining motionless, a CT of the pelvis without and with contrast material is recommended. 2. Small polypoid filling defect within the right inferior bladder wall measuring 3 mm. This may represent a small polyp or mass. 3. Small volume of free fluid within the dependent pelvis. 4. Diffuse soft tissue edema compatible with anasarca. Electronically Signed   By: Kerby Moors M.D.   On: 05/08/2021 08:19   MR ABDOMEN WO CONTRAST  Result Date: 05/08/2021 CLINICAL DATA:  Right lower quadrant mass EXAM: MRI ABDOMEN AND PELVIS WITHOUT CONTRAST TECHNIQUE: Multiplanar multisequence MR imaging of the abdomen and pelvis was performed. No intravenous contrast was administered. COMPARISON:  CT from 05/06/2021 FINDINGS: COMBINED FINDINGS FOR BOTH MR ABDOMEN AND PELVIS Lower chest: Not visualized Hepatobiliary: Not visualized Pancreas:  Not visualized Spleen:  Not visualized Adrenals/Urinary Tract: Nonvisualized. Inferior pole of both kidneys are included within this exam. No hydronephrosis identified. Several small cysts noted within the mid and inferior pole of the scratch set several small cysts are partially visualized. There is a small polypoid filling defect within the right inferior bladder wall measuring 3 mm, image 15/2. Stomach/Bowel: No dilated bowel loops. The visualized bowel loops are normal in caliber. No signs of bowel wall thickening. Vascular/Lymphatic: Aortic atherosclerosis. No abdominopelvic adenopathy. Reproductive: Prostate gland enlargement. Other: Again  seen is a large, heterogeneous mass containing calcifications involving the inferior right iliopsoas muscle. This measures 11.4 by 8.2 by 8.9 cm. There is heterogeneous increased T2 signal and fairly homogeneous T1 isointense signal. This is incompletely characterized without IV contrast material. Although similar in size to the study from 05/06/2021 there has been significant interval increase in size of mass compared with 01/22/2021. Small volume of free fluid noted within the dependent pelvis. Diffuse soft tissue edema is identified compatible with anasarca. Musculoskeletal: Heterogeneous appearance of the bone marrow is nonspecific. IMPRESSION: 1. Large heterogeneous mass containing calcifications involving the inferior right iliopsoas muscle is similar in size to the study from 05/06/2021. This is incompletely characterized without IV contrast material. Differential considerations include benign and malignant neoplasm. Alternatively, a chronic hematoma may have a similar appearance. In the absence of signs or symptoms of infection, abscess is considered less favored. More definitive characterization with contrast enhanced cross-sectional imaging is recommended to assess the enhancement characteristics of this indeterminate mass. In a patient who may have difficulty with breath hold technique and/or remaining motionless, a CT of the pelvis without and with contrast material is recommended. 2. Small polypoid filling defect within the right inferior bladder wall measuring 3 mm. This may represent a small polyp or mass. 3. Small volume of free fluid within the dependent pelvis. 4. Diffuse soft tissue edema compatible with anasarca. Electronically Signed   By: Kerby Moors M.D.   On: 05/08/2021 08:19   DG Chest Port 1 View  Result Date: 05/06/2021 CLINICAL DATA:  Weakness EXAM: PORTABLE CHEST 1 VIEW COMPARISON:  11/19/2020 FINDINGS: The heart size and mediastinal contours are within normal limits. No focal  pulmonary opacity. Previously noted small pleural effusions are no longer seen. No acute osseous abnormality. IMPRESSION: No active disease. Electronically Signed   By: Merilyn Baba M.D.   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  - CT yesterday shows 9.2 by 7.9 by 10.2 cm lesion which is significantly increased in size from 01/22/21 (5.5 x 3.4 x 4.0 cm) - concern for possible sarcoma - patient can't undergo contrasted CT with AKI - MRI without contrast was degraded by motion, will need to discuss with primary team if patient could tolerate contrast once AKI improved - no indication for emergent surgical intervention but we will follow    FEN: HH/CM diet VTE: SCDs, LMWH ID: no current abx   AKI on CKD stage III - Cr 2.02, baseline appears to be around 1.4 Atrial fibrillation  HTN HLD Hx of CVA Hx of seizures T2DM  LOS: 2 days    Norm Parcel, [facility] Surgery 05/08/2021, 9:55 AM Please see Amion for pager number during day hours 7:00am-4:30pm

## 2021-05-08 NOTE — Progress Notes (Signed)
Patient has not urinated this shift. Pt sat up in chair for 2-3 hours, assisted to [facility], assisted with urinal too but did not urinate. Per pt, he does not have the urge to go. Bladder scanner showing 346 ml> IVF infusing as ordered. Pt also has +4 edema too BLE. MD Dahal made aware. Awaiting orders

## 2021-05-08 NOTE — TOC Progression Note (Signed)
Transition of Care [facility]) - Progression Note    Patient Details  Name: Phillip Oconnor MRN: [id number] Date of Birth: [birth year]  Transition of Care [facility]) CM/SW Contact  Lawrie Tunks, Juliann Pulse, RN Phone Number: 05/08/2021, 2:22 PM  Clinical Narrative: Spoke to patient in rm about PT recc [facility] SN[facility]-patient agreed to fax out,also informed his brother Bruce-both in agreement-await bed offers.      Expected Discharge Plan: [place] Barriers to Discharge: Continued Medical Work up  Expected Discharge Plan and Services Expected Discharge Plan: [place]   Discharge Planning Services: CM Consult Post Acute Care Choice: [place] Living arrangements for the past 2 months: Single Family Home                                       Social Determinants of Health (SDOH) Interventions    Readmission Risk Interventions No flowsheet data found.

## 2021-05-08 NOTE — Progress Notes (Signed)
I&O cath done per MD Dahal. 375 ml out. Will continue to monitor.

## 2021-05-08 NOTE — Progress Notes (Signed)
@  2120 pt alert, trying to get out of bed and saying that he wants to go home and leave the hospital. This RN asked why he wants to leave  and pt states that he doesn't want any chemotherapy and that he's brother is deciding for him which he can decide for himself, I explained the reason why he's here and he needs to be able to take care of himself before he can go home, pt was able to get back to bed but pt refusing his medication, I tried to call his brother but there's no answer so I just left a message to call me back.

## 2021-05-09 ENCOUNTER — Other Ambulatory Visit (HOSPITAL_COMMUNITY): Payer: Medicare Other

## 2021-05-09 DIAGNOSIS — M6289 Other specified disorders of muscle: Secondary | ICD-10-CM | POA: Diagnosis not present

## 2021-05-09 LAB — CBC WITH DIFFERENTIAL/PLATELET
Abs Immature Granulocytes: 0.08 10*3/uL — ABNORMAL HIGH (ref 0.00–0.07)
Basophils Absolute: 0 10*3/uL (ref 0.0–0.1)
Basophils Relative: 0 %
Eosinophils Absolute: 0 10*3/uL (ref 0.0–0.5)
Eosinophils Relative: 0 %
HCT: 37.2 % — ABNORMAL LOW (ref 39.0–52.0)
Hemoglobin: 12 g/dL — ABNORMAL LOW (ref 13.0–17.0)
Immature Granulocytes: 1 %
Lymphocytes Relative: 9 %
Lymphs Abs: 0.7 10*3/uL (ref 0.7–4.0)
MCH: 29.9 pg (ref 26.0–34.0)
MCHC: 32.3 g/dL (ref 30.0–36.0)
MCV: 92.8 fL (ref 80.0–100.0)
Monocytes Absolute: 0.9 10*3/uL (ref 0.1–1.0)
Monocytes Relative: 12 %
Neutro Abs: 6.2 10*3/uL (ref 1.7–7.7)
Neutrophils Relative %: 78 %
Platelets: 156 10*3/uL (ref 150–400)
RBC: 4.01 MIL/uL — ABNORMAL LOW (ref 4.22–5.81)
RDW: 17.3 % — ABNORMAL HIGH (ref 11.5–15.5)
WBC: 8 10*3/uL (ref 4.0–10.5)
nRBC: 0 % (ref 0.0–0.2)

## 2021-05-09 LAB — GLUCOSE, CAPILLARY
Glucose-Capillary: 136 mg/dL — ABNORMAL HIGH (ref 70–99)
Glucose-Capillary: 141 mg/dL — ABNORMAL HIGH (ref 70–99)
Glucose-Capillary: 147 mg/dL — ABNORMAL HIGH (ref 70–99)
Glucose-Capillary: 150 mg/dL — ABNORMAL HIGH (ref 70–99)

## 2021-05-09 LAB — BASIC METABOLIC PANEL
Anion gap: 13 (ref 5–15)
BUN: 48 mg/dL — ABNORMAL HIGH (ref 8–23)
CO2: 15 mmol/L — ABNORMAL LOW (ref 22–32)
Calcium: 8.6 mg/dL — ABNORMAL LOW (ref 8.9–10.3)
Chloride: 111 mmol/L (ref 98–111)
Creatinine, Ser: 1.89 mg/dL — ABNORMAL HIGH (ref 0.61–1.24)
GFR, Estimated: 35 mL/min — ABNORMAL LOW (ref >60)
Glucose, Bld: 155 mg/dL — ABNORMAL HIGH (ref 70–99)
Potassium: 4.4 mmol/L (ref 3.5–5.1)
Sodium: 139 mmol/L (ref 135–145)

## 2021-05-09 NOTE — Progress Notes (Signed)
Pt cooperative with care, given PRN pain med and pt brother still at the bedside. Will continue plan of care.

## 2021-05-09 NOTE — Progress Notes (Signed)
PROGRESS NOTE  KROSS SWALLOWS  DOB: 1941-02-08  PCP: Jenel Lucks, PA-C HFW:[id number]  DOA: 05/06/2021  LOS: 3 days  Hospital Day: 4   Chief Complaint  Patient presents with   Hip Pain    Brief narrative: Phillip Oconnor is a 80 y.o. male with PMH significant for DM2, HTN, HLD, stroke, seizure, chronic bilateral lower extremity edema who lives alone at home. Patient presented to the ED on 9/20 with progressively worsening weakness of right lower extremity and right hip pain with difficulty ambulation for 3 to 4 days.  He noticed right hip pain the day before he had a hernia surgery on 01/23/2021.   Of note, CT scan of abdomen and pelvis done on 01/23/2019 had shown right psoas muscle mass.  Patient was recommended to follow-up with surgeon Dr. Zenia Resides as an outpatient which he missed.  In the ED, blood pressure was elevated 169/63 Labs with unremarkable CBC, serum bicarb low at 15, creatinine elevated to 2.17. CT abdomen pelvis showed 1. Soft tissue mass in the right inferior psoas region has significantly increased in size measuring up to 10.2 cm when compared to CT scan from 3 months ago. 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 2. New small volume ascites, diffuse body wall edema and small bilateral pleural effusions.  Patient was admitted to hospitalist service for further evaluation management.  Subjective: Patient was seen and examined this morning.   Seems somnolent this morning.  Opens eyes and verbal command.  No complaints.  Labs this morning with improving creatinine.  Assessment/Plan: Right inferior psoas muscle mass  -Case discussed with oncologist Dr. Earlie Server and surgeon Dr. Zenia Resides.  Core biopsy was not recommended because of the possibility of it being a sarcoma.  Noncontrast CT scan and MRI were obtained but the mass could not be characterized properly.   -Patient is currently getting IV hydration with the hope improve his creatinine down to baseline.  We will  plan for contrast study after that.  Creatinine is improving.  May be able to do the scans tomorrow.  Acute encephalopathy -On the first day of evaluation on 9/21, patient was awake, alert and able to have a conversation. -In subsequent evaluations, patient has been less awake.  He is able to open eyes and mumble to sleep.  I will stop as needed IV morphine at this time.  The only other medicine with neurological effect he is on his Percocet as needed and Keppra.  Continue monitor mental status change.  AKI on CKD 3a -Baseline creatinine less than 1.5.  Presented with a creatinine of 2.17. -Gradually improving with IV fluid.  Creatinine is 1.89 today. Recent Labs    11/21/20 1610 11/22/20 0338 11/23/20 0219 11/24/20 1008 01/22/21 1304 01/23/21 0417 05/06/21 1817 05/07/21 0410 05/08/21 0450 05/09/21 0421  BUN 16 15 12 10 15 14  43* 48* 46* 48*  CREATININE 1.47* 1.43* 1.50* 1.46* 1.39* 1.32* 2.17* 2.08* 2.02* 1.89*   Uncontrolled type 2 diabetes mellitus -A1c was 9.6 on April 2022.  It seems to have improved to 6.8 on repeat test this admission.  Probably due to poor appetite. -Home meds include glipizide 10 mg twice daily, metformin 500 mg twice daily.  Apparently was not taking Tradjenta and insulin at home. -Currently oral meds on hold.  Blood sugar is consistently less than 150 with sliding scale insulin only.  Continue the same for now. Recent Labs  Lab 05/07/21 2131 05/08/21 0806 05/08/21 1658 05/08/21 2123 05/09/21 0809  GLUCAP 135*  137* 160* 128* 136*   Essential hypertension -Home meds include Coreg 3.125 mg twice daily, doxazosin 4 mg daily  -Currently blood pressure is controlled with the same. -Continue to monitor blood pressure  Chronic lower extremity edema -Patient has pitting edema bilaterally.  Probably worsened in last several days because of immobility.  I do not see any diuretics in his home list.  Technically he would benefit from diuretics in the  long-term.  However at this time he is requiring IV fluid for AKI and responding well to fluid.  Dyslipidemia History of stroke -Continue aspirin 325 daily, Lipitor 80 mg daily  History of seizure disorder -Continue Keppra 5 mg twice daily  Mobility: Impaired mobility.  PT eval ordered. Code Status:   Code Status: Full Code  Nutritional status: Body mass index is 23.64 kg/m.     Diet:  Diet Order             Diet heart healthy/carb modified Room service appropriate? Yes; Fluid consistency: Thin  Diet effective now                  DVT prophylaxis:  enoxaparin (LOVENOX) injection 40 mg Start: 05/07/21 1000   Antimicrobials: None Fluid: Normal saline at 125 mill per hour Consultants: General surgery, oncology Family Communication: None at bedside  Status is: Inpatient  Remains inpatient appropriate because: Ongoing work-up for potential malignancy  Dispo: The patient is from: Home              Anticipated d/c is to: Pending clinical course              Patient currently is not medically stable to d/c.   Difficult to place patient No     Infusions:   sodium chloride 125 mL/hr at 05/09/21 0174    Scheduled Meds:  atorvastatin  80 mg Oral Daily   carvedilol  3.125 mg Oral BID WC   doxazosin  4 mg Oral Daily   enoxaparin (LOVENOX) injection  40 mg Subcutaneous Q24H   insulin aspart  0-5 Units Subcutaneous QHS   insulin aspart  0-9 Units Subcutaneous TID WC   levETIRAcetam  500 mg Oral BID    Antimicrobials: Anti-infectives (From admission, onward)    None       PRN meds: acetaminophen **OR** acetaminophen, magnesium hydroxide, ondansetron (ZOFRAN) IV, ondansetron **OR** ondansetron (ZOFRAN) IV, oxyCODONE-acetaminophen, traZODone   Objective: Vitals:   05/08/21 1203 05/09/21 0342  BP: 127/62 (!) 129/48  Pulse: 69 (!) 41  Resp: 20 18  Temp: 98.1 F (36.7 C) 97.6 F (36.4 C)  SpO2: 100% 100%    Intake/Output Summary (Last 24 hours) at  05/09/2021 1024 Last data filed at 05/09/2021 0600 Gross per 24 hour  Intake 1141.86 ml  Output 375 ml  Net 766.86 ml   Filed Weights   05/06/21 1746 05/06/21 2342  Weight: 90.2 kg 88.1 kg   Weight change:  Body mass index is 23.64 kg/m.   Physical Exam: General exam: Pleasant elderly African-American male.  Not in physical pain. Skin: No rashes, lesions or ulcers. HEENT: Atraumatic, normocephalic, no obvious bleeding Lungs: Clear to auscultation bilaterally CVS: Regular rate and rhythm, no murmur GI/Abd soft, tenderness present in right lower abdomen, nondistended, bowel sound present. CNS: Somnolent, opens eyes on verbal command, mumbles.  Falls right back asleep Psychiatry: Depressed look today. Extremities: No pedal edema, no calf tenderness  Data Review: I have personally reviewed the laboratory data and studies available.  Recent Labs  Lab 05/06/21 1817 05/07/21 0410 05/08/21 0450 05/09/21 0421  WBC 9.7 9.7 5.9 8.0  NEUTROABS 8.1*  --  4.5 6.2  HGB 13.4 11.5* 11.2* 12.0*  HCT 40.7 36.2* 34.0* 37.2*  MCV 90.6 92.1 91.6 92.8  PLT 171 155 167 156   Recent Labs  Lab 05/06/21 1817 05/07/21 0410 05/08/21 0450 05/09/21 0421  NA 138 138 137 139  K 4.4 4.5 4.1 4.4  CL 108 111 114* 111  CO2 15* 15* 16* 15*  GLUCOSE 171* 173* 157* 155*  BUN 43* 48* 46* 48*  CREATININE 2.17* 2.08* 2.02* 1.89*  CALCIUM 9.3 8.9 8.5* 8.6*    F/u labs ordered Unresulted Labs (From admission, onward)     Start     Ordered   05/08/21 0500  CBC with Differential/Platelet  Daily,   R      05/07/21 1354   05/08/21 3729  Basic metabolic panel  Daily,   R      05/07/21 1354            Signed, Terrilee Croak, MD [facility] 05/09/2021

## 2021-05-09 NOTE — Progress Notes (Signed)
@  0230 pt getting out of bed, stating that he wants to leave the hospital and pt removed the cardiac monitoring, refusing to put it back on, CN and AC at the bedside, notified on call [place]. This RN able to speak to his brother [name] and brother offered himself to come at this time. Pt sitting at the edge of the bed with NT at the bedside.

## 2021-05-09 NOTE — Progress Notes (Signed)
Chaplain received page that patient wished to fill out AD to assign his brother as HCPOA.  Chaplain spoke with patient who agreed that he wanted his brother to be his 72 but said it had all been signed already.  Patient then stated that he did not want chaplain to call his brother.  When his nurse reminded him of the conversation previously about this when his brother was here, he stated that he did not want to make his brother HCPOA.  Please page if further needs arise or if we can be of assistance.  [place], [place] Pager, [phone] 12:07 PM

## 2021-05-09 NOTE — Care Management Important Message (Signed)
Important Message  Patient Details IM Letter given to the Patient. Name: Phillip Oconnor MRN: [id number] Date of Birth: [birth year]   Medicare Important Message Given:  Yes     Kerin Salen 05/09/2021, 10:59 AM

## 2021-05-09 NOTE — Progress Notes (Signed)
Pt brother Bruce at the bedside.

## 2021-05-09 NOTE — TOC Progression Note (Addendum)
Transition of Care [facility]) - Progression Note    Patient Details  Name: Phillip Oconnor MRN: [id number] Date of Birth: 02-Dec-1940  Transition of Care [facility] CM/SW Contact  Charlcie Prisco, Juliann Pulse, RN Phone Number: 05/09/2021, 1:03 PM  Clinical Narrative:   Damaris Schooner to [place] brother-chose [place] left her a vm.Also left list in rm for brother.Not medically stable for d/c today. 1:32p-Received call back from [facility] rep for [facility]-able to accept likely bed available Monday.  1. 1.3 mi [facility] [place], [place] [address] [phone] Overall rating Above average 2. 1.6 mi [facility] [place], [place] [address] [phone] Overall rating Much below average 3. 2.1 mi [place], [place] [address] [phone] Overall rating Much below average 4. 2.5 mi [facility] [place], [place] [address] [phone] Overall rating Below average 5. 2.8 mi [facility] [place], [place] [address] [phone] Overall rating Above average 6. 2.8 mi [facility] [address] [place], [place] [address] [phone] Overall rating Below average 7. 3 mi [facility] [place], [place] [address] [phone] Overall rating Above average 8. 3.6 [place] [address] [place], [place] [address] [phone] Overall rating Below average 9. 3.6 mi [facility] [address] [place], [place] [address] [phone] Overall rating Much below average 10. 3.9 mi [facility] [place], [place] [address] [phone] Overall  rating Below average 11. 4.4 mi [facility] at [place], [place] [address] [phone] Overall rating Much above average 12. 4.6 mi [facility] [address] [place], [place] [address] [phone] Overall rating Above average 13. 5.5 mi [facility] [address] [place], [place] [address] [phone] Overall rating Above average 14. 8.2 [facility] [place], [place] [address] [phone] Overall rating Above average 15. 9 mi [facility] [place] [address] [place], [place] [address] [phone] Overall rating Average 16. 9.1 [facility] [place], [place] [address] [phone] Overall rating Average 17. 9.2 mi [facility] [address] [place], [place] [address] [phone] Overall rating Much above average 18. 10.8 mi [place] [facility] [address] [place], [place] [address] [phone] Overall rating Much above average 19. 12.6 mi [place], [place] [address] [phone] Overall rating Much below average 20. 12.8 [facility] [place], [place] [address] [phone] Overall rating Much below average 21. 14.2 mi [facility] [place] [address] [place], [place] [address] [phone] Overall rating Much below average 22. 14.4 mi [facility] [place], [place] [address] [phone] Overall rating Above average 23. 14.8 [place], [place] [address] [phone] Overall rating Much above average 24. 14.9 [place] [address] [place], [place] [address] [phone] Overall  rating Much below average 25. 16.5 mi [facility] [address] [place],  [address] [phone] Overall rating Above average 26. 16.7 mi [facility] [place], [place] [address] [phone] Overall rating Much above average 27. 17.9 mi [place], [place] [address] [phone] Overall rating Below average 28. 63.3 [facility] [address] [place], [place] [address] [phone] Overall rating Much below average 29. 19.7 mi [place] [address] [place], [place] [address] [phone] Overall rating Much below average 30. 20 [address] at [employer] at [facility], [place] [address] [phone] Overall rating Much above average 31. 21.1 mi [place], [place] [address] [phone] Overall rating Much below average 32. 21.6 [address] [place], [place] [address] [phone] Overall rating Below average 33. 28.0 [facility] [address] [place], [place] [address] [phone] Overall rating Below average 34. 21.8 [place], [place] [address] [phone] Overall rating Much above average 35. [address] [place], [place] [address] [phone] Overall rating Much above average 36. 22.6 mi [facility] [address] [place], [place] [address] [phone] Overall rating Average 37. 22.7 mi [facility] [place], [place] [address] [phone] Overall rating Much below average 38. 23.3 mi [facility] [address] [place], [place] [address] [phone] Overall rating Above average 39. 23.5 [place], [place] [address] [phone] Overall rating Much below average 40. 24.1 mi [place] [address] [place], [place] [address] [phone] Overall rating Much below average 41. 24.2 mi [place] [address] [place], [place] [address] [phone] Overall rating Below average 42. 24.4 [facility], [place] [address] [phone] Overall rating Much below average 43. 24.5 mi [facility] [place], [place] [address] [phone] Overall rating Above average To explore and do  Expected Discharge Plan: [place] Barriers to Discharge: Continued Medical Work up  Expected Discharge Plan and Services Expected Discharge Plan: [place]   Discharge Planning Services: CM Consult Post Acute Care Choice: [place] arrangements for the past 2 months: Single Family Home                                       Social Determinants of Health (SDOH) Interventions    Readmission Risk Interventions No flowsheet data found.

## 2021-05-09 NOTE — Plan of Care (Signed)
  Problem: Education: Goal: Knowledge of General Education information will improve Description: Including pain rating scale, medication(s)/side effects and non-pharmacologic comfort measures Outcome: Progressing   Problem: Clinical Measurements: Goal: Will remain free from infection Outcome: Progressing   Problem: Activity: Goal: Risk for activity intolerance will decrease Outcome: Progressing   

## 2021-05-10 ENCOUNTER — Inpatient Hospital Stay (HOSPITAL_COMMUNITY): Payer: Medicare Other

## 2021-05-10 DIAGNOSIS — M6289 Other specified disorders of muscle: Secondary | ICD-10-CM | POA: Diagnosis not present

## 2021-05-10 DIAGNOSIS — I5031 Acute diastolic (congestive) heart failure: Secondary | ICD-10-CM | POA: Diagnosis not present

## 2021-05-10 LAB — CBC WITH DIFFERENTIAL/PLATELET
Abs Immature Granulocytes: 0.02 10*3/uL (ref 0.00–0.07)
Basophils Absolute: 0 10*3/uL (ref 0.0–0.1)
Basophils Relative: 0 %
Eosinophils Absolute: 0 10*3/uL (ref 0.0–0.5)
Eosinophils Relative: 0 %
HCT: 35.6 % — ABNORMAL LOW (ref 39.0–52.0)
Hemoglobin: 11.2 g/dL — ABNORMAL LOW (ref 13.0–17.0)
Immature Granulocytes: 0 %
Lymphocytes Relative: 8 %
Lymphs Abs: 0.5 10*3/uL — ABNORMAL LOW (ref 0.7–4.0)
MCH: 29.2 pg (ref 26.0–34.0)
MCHC: 31.5 g/dL (ref 30.0–36.0)
MCV: 93 fL (ref 80.0–100.0)
Monocytes Absolute: 0.8 10*3/uL (ref 0.1–1.0)
Monocytes Relative: 11 %
Neutro Abs: 5.9 10*3/uL (ref 1.7–7.7)
Neutrophils Relative %: 81 %
Platelets: 144 10*3/uL — ABNORMAL LOW (ref 150–400)
RBC: 3.83 MIL/uL — ABNORMAL LOW (ref 4.22–5.81)
RDW: 17.6 % — ABNORMAL HIGH (ref 11.5–15.5)
WBC: 7.2 10*3/uL (ref 4.0–10.5)
nRBC: 0 % (ref 0.0–0.2)

## 2021-05-10 LAB — BASIC METABOLIC PANEL
Anion gap: 8 (ref 5–15)
BUN: 53 mg/dL — ABNORMAL HIGH (ref 8–23)
CO2: 15 mmol/L — ABNORMAL LOW (ref 22–32)
Calcium: 8.4 mg/dL — ABNORMAL LOW (ref 8.9–10.3)
Chloride: 116 mmol/L — ABNORMAL HIGH (ref 98–111)
Creatinine, Ser: 2.01 mg/dL — ABNORMAL HIGH (ref 0.61–1.24)
GFR, Estimated: 33 mL/min — ABNORMAL LOW (ref >60)
Glucose, Bld: 187 mg/dL — ABNORMAL HIGH (ref 70–99)
Potassium: 4.5 mmol/L (ref 3.5–5.1)
Sodium: 139 mmol/L (ref 135–145)

## 2021-05-10 LAB — GLUCOSE, CAPILLARY: Glucose-Capillary: 174 mg/dL — ABNORMAL HIGH (ref 70–99)

## 2021-05-10 LAB — ECHOCARDIOGRAM COMPLETE
Height: 76 in
S' Lateral: 3.1 cm
Weight: 3107.6 oz

## 2021-05-10 NOTE — Progress Notes (Signed)
Echocardiogram 2D Echocardiogram has been performed.  Oneal Deputy Abryana Lykens RDCS 05/10/2021, 11:17 AM

## 2021-05-10 NOTE — Plan of Care (Signed)
  Problem: Education: Goal: Knowledge of General Education information will improve Description: Including pain rating scale, medication(s)/side effects and non-pharmacologic comfort measures Outcome: Progressing   Problem: Clinical Measurements: Goal: Diagnostic test results will improve Outcome: Progressing   Problem: Nutrition: Goal: Adequate nutrition will be maintained Outcome: Progressing   

## 2021-05-10 NOTE — Progress Notes (Addendum)
PROGRESS NOTE  Phillip Oconnor  DOB: November 06, 1940  PCP: Jenel Lucks, PA-C HWE:[id number]  DOA: 05/06/2021  LOS: 4 days  Hospital Day: 5   Chief Complaint  Patient presents with   Hip Pain    Brief narrative: Phillip Oconnor is a 80 y.o. male with PMH significant for DM2, HTN, HLD, stroke, seizure, chronic bilateral lower extremity edema who lives alone at home. Patient presented to the ED on 9/20 with progressively worsening weakness of right lower extremity and right hip pain with difficulty ambulation for 3 to 4 days.  He noticed right hip pain the day before he had a hernia surgery on 01/23/2021.   Of note, CT scan of abdomen and pelvis done on 01/23/2019 had shown right psoas muscle mass.  Patient was recommended to follow-up with surgeon Dr. Zenia Resides as an outpatient which he missed.  In the ED, blood pressure was elevated 169/63 Labs with unremarkable CBC, serum bicarb low at 15, creatinine elevated to 2.17. CT abdomen pelvis showed 1. Soft tissue mass in the right inferior psoas region has significantly increased in size measuring up to 10.2 cm when compared to CT scan from 3 months ago. 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 2. New small volume ascites, diffuse body wall edema and small bilateral pleural effusions.  Patient was admitted to hospitalist service for further evaluation management.  Subjective: Patient was seen and examined this morning.   Alert, awake, slow to respond.  On IV hydration.  Creatinine not improving anymore.  Assessment/Plan: Right inferior psoas muscle mass  -Case discussed with oncologist Dr. Earlie Server and surgeon Dr. Zenia Resides.  Core biopsy was not recommended because of the possibility of it being a sarcoma.  Noncontrast CT scan and MRI were obtained but the mass could not be characterized properly.   -Patient is currently getting IV hydration with the hope improve his creatinine down to baseline.  We will plan for contrast study after that.   Creatinine initially improved to 1.89 yesterday but back again to 2.01 today.  Unable to do contrast study at this time.  Acute encephalopathy -On the first day of evaluation on 9/21, patient was awake, alert and able to have a conversation. -In subsequent evaluations, patient has been looking more tired.  IV morphine was stopped yesterday.  Currently on Keppra and as needed Percocet.  AKI on CKD 3a -Baseline creatinine less than 1.5.  Presented with a creatinine of 2.17. -Gradually improved with hydration at first but creatinine is up again today.  I would continue IV fluid for next 1 to 2 days with an expectation to have improved creatinine level. Recent Labs    11/22/20 0338 11/23/20 0219 11/24/20 1008 01/22/21 1304 01/23/21 0417 05/06/21 1817 05/07/21 0410 05/08/21 0450 05/09/21 0421 05/10/21 0805  BUN 15 12 10 15 14  43* 48* 46* 48* 53*  CREATININE 1.43* 1.50* 1.46* 1.39* 1.32* 2.17* 2.08* 2.02* 1.89* 2.01*    Uncontrolled type 2 diabetes mellitus -A1c was 9.6 on April 2022.  It seems to have improved to 6.8 on repeat test this admission.  Probably due to poor appetite. -Home meds include glipizide 10 mg twice daily, metformin 500 mg twice daily.  Apparently was not taking Tradjenta and insulin at home. -Currently oral meds on hold.  Blood sugar is consistently less than 150 with sliding scale insulin only.  Continue the same for now. Recent Labs  Lab 05/08/21 2123 05/09/21 0809 05/09/21 1141 05/09/21 1701 05/09/21 2036  GLUCAP 128* 136* 141* 147* 150*  Essential hypertension -Home meds include Coreg 3.125 mg twice daily, doxazosin 4 mg daily  -Currently blood pressure is controlled with the same. -Continue to monitor blood pressure  Chronic lower extremity edema -Patient has pitting edema bilaterally.  Probably worsened in last several days because of immobility.  I do not see any diuretics in his home list.  Technically he would benefit from diuretics in the  long-term.  However at this time he is requiring IV fluid for AKI and responding well to fluid.  Dyslipidemia History of stroke -Continue aspirin 325 daily, Lipitor 80 mg daily  History of seizure disorder -Continue Keppra 5 mg twice daily  Mobility: Impaired mobility.  PT eval ordered. Code Status:   Code Status: Full Code  Nutritional status: Body mass index is 23.64 kg/m.     Diet:  Diet Order             Diet heart healthy/carb modified Room service appropriate? Yes; Fluid consistency: Thin  Diet effective now                  DVT prophylaxis:  Place TED hose Start: 05/09/21 1147 enoxaparin (LOVENOX) injection 40 mg Start: 05/07/21 1000   Antimicrobials: None Fluid: Normal saline at 125 mill per hour Consultants: General surgery, oncology Family Communication: None at bedside  Status is: Inpatient  Remains inpatient appropriate because: On IV hydration.  If creatinine does not improve, may not be able to do contrast study.  Palliative care consultation may be required. Dispo: The patient is from: Home              Anticipated d/c is to: Pending clinical course              Patient currently is not medically stable to d/c.   Difficult to place patient No     Infusions:   sodium chloride 125 mL/hr at 05/10/21 0454    Scheduled Meds:  atorvastatin  80 mg Oral Daily   carvedilol  3.125 mg Oral BID WC   doxazosin  4 mg Oral Daily   enoxaparin (LOVENOX) injection  40 mg Subcutaneous Q24H   insulin aspart  0-5 Units Subcutaneous QHS   insulin aspart  0-9 Units Subcutaneous TID WC   levETIRAcetam  500 mg Oral BID    Antimicrobials: Anti-infectives (From admission, onward)    None       PRN meds: acetaminophen **OR** acetaminophen, magnesium hydroxide, ondansetron (ZOFRAN) IV, ondansetron **OR** ondansetron (ZOFRAN) IV, oxyCODONE-acetaminophen, traZODone   Objective: Vitals:   05/10/21 0449 05/10/21 1401  BP: (!) 148/67 129/71  Pulse: (!) 57 60   Resp: 20 18  Temp: 97.6 F (36.4 C) (!) 97.5 F (36.4 C)  SpO2: 100% 100%    Intake/Output Summary (Last 24 hours) at 05/10/2021 1432 Last data filed at 05/10/2021 1100 Gross per 24 hour  Intake 889.24 ml  Output 650 ml  Net 239.24 ml    Filed Weights   05/06/21 1746 05/06/21 2342  Weight: 90.2 kg 88.1 kg   Weight change:  Body mass index is 23.64 kg/m.   Physical Exam: General exam: Pleasant elderly African-American male.  Not in physical pain Skin: No rashes, lesions or ulcers. HEENT: Atraumatic, normocephalic, no obvious bleeding Lungs: Clear to auscultation bilaterally CVS: Regular rate and rhythm, no murmur GI/Abd soft, tenderness present in right lower abdomen, nondistended, bowel sound present. CNS: Slow to respond, somnolent, opens eyes on verbal command, mumbles.  Falls right back asleep Psychiatry: Depressed look today.  Extremities: No pedal edema, no calf tenderness  Data Review: I have personally reviewed the laboratory data and studies available.  Recent Labs  Lab 05/06/21 1817 05/07/21 0410 05/08/21 0450 05/09/21 0421 05/10/21 0805  WBC 9.7 9.7 5.9 8.0 7.2  NEUTROABS 8.1*  --  4.5 6.2 5.9  HGB 13.4 11.5* 11.2* 12.0* 11.2*  HCT 40.7 36.2* 34.0* 37.2* 35.6*  MCV 90.6 92.1 91.6 92.8 93.0  PLT 171 155 167 156 144*    Recent Labs  Lab 05/06/21 1817 05/07/21 0410 05/08/21 0450 05/09/21 0421 05/10/21 0805  NA 138 138 137 139 139  K 4.4 4.5 4.1 4.4 4.5  CL 108 111 114* 111 116*  CO2 15* 15* 16* 15* 15*  GLUCOSE 171* 173* 157* 155* 187*  BUN 43* 48* 46* 48* 53*  CREATININE 2.17* 2.08* 2.02* 1.89* 2.01*  CALCIUM 9.3 8.9 8.5* 8.6* 8.4*     F/u labs ordered Unresulted Labs (From admission, onward)     Start     Ordered   05/11/21 0500  CBC with Differential/Platelet  Daily,   R      05/10/21 0751   05/11/21 5001  Basic metabolic panel  Daily,   R      05/10/21 0751            Signed, Terrilee Croak, MD [facility] 05/10/2021

## 2021-05-11 DIAGNOSIS — Z515 Encounter for palliative care: Secondary | ICD-10-CM

## 2021-05-11 DIAGNOSIS — Z7189 Other specified counseling: Secondary | ICD-10-CM

## 2021-05-11 DIAGNOSIS — M6289 Other specified disorders of muscle: Secondary | ICD-10-CM | POA: Diagnosis not present

## 2021-05-11 DIAGNOSIS — N179 Acute kidney failure, unspecified: Secondary | ICD-10-CM | POA: Diagnosis not present

## 2021-05-11 LAB — BASIC METABOLIC PANEL
Anion gap: 10 (ref 5–15)
BUN: 56 mg/dL — ABNORMAL HIGH (ref 8–23)
CO2: 15 mmol/L — ABNORMAL LOW (ref 22–32)
Calcium: 8.4 mg/dL — ABNORMAL LOW (ref 8.9–10.3)
Chloride: 113 mmol/L — ABNORMAL HIGH (ref 98–111)
Creatinine, Ser: 2.03 mg/dL — ABNORMAL HIGH (ref 0.61–1.24)
GFR, Estimated: 32 mL/min — ABNORMAL LOW (ref >60)
Glucose, Bld: 234 mg/dL — ABNORMAL HIGH (ref 70–99)
Potassium: 4.2 mmol/L (ref 3.5–5.1)
Sodium: 138 mmol/L (ref 135–145)

## 2021-05-11 LAB — CBC WITH DIFFERENTIAL/PLATELET
Abs Immature Granulocytes: 0.02 10*3/uL (ref 0.00–0.07)
Basophils Absolute: 0 10*3/uL (ref 0.0–0.1)
Basophils Relative: 0 %
Eosinophils Absolute: 0 10*3/uL (ref 0.0–0.5)
Eosinophils Relative: 0 %
HCT: 34.1 % — ABNORMAL LOW (ref 39.0–52.0)
Hemoglobin: 10.9 g/dL — ABNORMAL LOW (ref 13.0–17.0)
Immature Granulocytes: 0 %
Lymphocytes Relative: 7 %
Lymphs Abs: 0.5 10*3/uL — ABNORMAL LOW (ref 0.7–4.0)
MCH: 29.9 pg (ref 26.0–34.0)
MCHC: 32 g/dL (ref 30.0–36.0)
MCV: 93.7 fL (ref 80.0–100.0)
Monocytes Absolute: 0.8 10*3/uL (ref 0.1–1.0)
Monocytes Relative: 12 %
Neutro Abs: 5.7 10*3/uL (ref 1.7–7.7)
Neutrophils Relative %: 81 %
Platelets: 128 10*3/uL — ABNORMAL LOW (ref 150–400)
RBC: 3.64 MIL/uL — ABNORMAL LOW (ref 4.22–5.81)
RDW: 17.9 % — ABNORMAL HIGH (ref 11.5–15.5)
WBC: 6.9 10*3/uL (ref 4.0–10.5)
nRBC: 0 % (ref 0.0–0.2)

## 2021-05-11 LAB — GLUCOSE, CAPILLARY
Glucose-Capillary: 203 mg/dL — ABNORMAL HIGH (ref 70–99)
Glucose-Capillary: 176 mg/dL — ABNORMAL HIGH (ref 70–99)
Glucose-Capillary: 195 mg/dL — ABNORMAL HIGH (ref 70–99)

## 2021-05-11 MED ORDER — HEPARIN SODIUM (PORCINE) 5000 UNIT/ML IJ SOLN
5000.0000 [IU] | Freq: Three times a day (TID) | INTRAMUSCULAR | Status: DC
  Administered 2021-05-12 – 2021-05-14 (×8): 5000 [IU] via SUBCUTANEOUS
  Filled 2021-05-11 (×8): qty 1

## 2021-05-11 NOTE — Progress Notes (Signed)
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  The patient remains very sleepy, and is able to give only minimal responses to questions.  His mental status does not seem to have improved since admission.  It is very difficult to tell on his current imaging if there is vascular involvement or if the mass is resectable, and unfortunately his ongoing AKI prevents administration of contrast to better assess this mass.  If it is technically resectable this will need to be done at an academic medical center.  The patient was able to tell me he is aware that he has a large tumor, but I am not sure that he fully understands the gravity of the situation. With his current mental status it is difficult to have an in-depth conversation regarding treatment moving forward.  When I asked him this morning about any family members who might serve as his healthcare power of attorney he did not identify anyone.  I think it is unlikely the patient will be fit to undergo a major operation, but it may be helpful to have palliative care see the patient this week to help determine the patient's goals of care and whether or not he would even want to undergo a major operation if it was indicated and technically feasible. We will continue to follow along.  Michaelle Birks, MD [facility] Surgery General, Hepatobiliary and Pancreatic Surgery 05/11/21 9:38 AM

## 2021-05-11 NOTE — Progress Notes (Signed)
Pt HR has been in the 50s throughout the day. MD notified. Dc'd Coreg.

## 2021-05-11 NOTE — Consult Note (Signed)
Consultation Note Date: 05/11/2021   Patient Name: Phillip Oconnor  DOB: Mar 30, 1941  MRN: [id number]  Age / Sex: 80 y.o., male  PCP: Jenel Lucks, PA-C Referring Physician: Terrilee Croak, MD  Reason for Consultation: Establishing goals of care  HPI/Patient Profile: 80 y.o. male  with past medical history of diabetes, hypertension, hyperlipidemia, stroke, seizure, chronic bilateral lower extremity edema with hernia surgery in June of this year that also had right psoas mass noted on CT scan in July.  He was recommended to follow-up with surgery but did not go to follow-up visits and was lost to follow-up.  He was admitted on 05/06/2021 with severe hip pain and was found to have significant growth of mass in psoas region now up to 10.2 cm.  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  He has become more lethargic/confused over the last couple of days and palliative consulted for goals of care..  Clinical Assessment and Goals of Care: Palliative care consult received.  Chart reviewed including personal review of pertinent labs and imaging.  I saw and examined Phillip Oconnor today.  He was lying in bed in no distress but he was very sleepy at time of my encounter.  This is consistent with examination by Drs. Zenia Resides and Dahal today as well.  Initially meeting with him,I introduced palliative care as specialized medical care for people living with serious illness. It focuses on providing relief from the symptoms and stress of a serious illness. The goal is to improve quality of life for both the patient and the family.  He was able to verbalize that he is not currently hurting, but he would not otherwise participate in  conversation with me.  I was unable to have conversation regarding goals of care.  Chart review does not reveal paperwork naming surrogate decision maker.  I see where it was determined that his brother would be his surrogate, but that he asked not to complete paperwork.   Is unclear to me at this point in time if his brother will be his legal decision maker.  I attempted to call him as it is the only number on the chart to discuss further.  I left a voicemail and await return call.  SUMMARY OF RECOMMENDATIONS   - Full code/full scope - Phillip Oconnor was to lethargic to participate in conversation regarding goals of care today.  It is noted throughout the chart that his brother has been involved.  I called and left a voicemail for him today.  Await return call.  Code Status/Advance Care Planning: Full code   Prognosis:  Guarded  Discharge Planning: To be determined     Primary Diagnoses: Present on Admission:  Mass of psoas muscle   I have reviewed the medical record, interviewed the patient and family, and examined the patient. The following aspects  are pertinent.  Past Medical History:  Diagnosis Date   DM2 (diabetes mellitus, type 2) (HCC)    HLD (hyperlipidemia)    HTN (hypertension)    Irregular heart beat    Seizures (HCC)    Stroke [facility])    Social History   Socioeconomic History   Marital status: Divorced    Spouse name: Not on file   Number of children: Not on file   Years of education: Not on file   Highest education level: Not on file  Occupational History   Not on file  Tobacco Use   Smoking status: Never   Smokeless tobacco: Never  Substance and Sexual Activity   Alcohol use: Never   Drug use: Never   Sexual activity: Not on file  Other Topics Concern   Not on file  Social History Narrative   Not on file   Social Determinants of Health   Financial Resource Strain: Not on file  Food Insecurity: Not on file  Transportation Needs: Not on file   Physical Activity: Not on file  Stress: Not on file  Social Connections: Not on file   Family History  Problem Relation Age of Onset   Bleeding Disorder Neg Hx    Scheduled Meds:  atorvastatin  80 mg Oral Daily   doxazosin  4 mg Oral Daily   [START ON 05/12/2021] heparin injection (subcutaneous)  5,000 Units Subcutaneous Q8H   insulin aspart  0-5 Units Subcutaneous QHS   insulin aspart  0-9 Units Subcutaneous TID WC   levETIRAcetam  500 mg Oral BID   Continuous Infusions:  sodium chloride 50 mL/hr at 05/11/21 1236   PRN Meds:.acetaminophen **OR** acetaminophen, magnesium hydroxide, ondansetron (ZOFRAN) IV, ondansetron **OR** ondansetron (ZOFRAN) IV, oxyCODONE-acetaminophen, traZODone Medications Prior to Admission:  Prior to Admission medications   Medication Sig Start Date End Date Taking? Authorizing Provider  aspirin 325 MG tablet Take 325 mg by mouth daily.   Yes [provider]  atorvastatin (LIPITOR) 80 MG tablet Take 1 tablet (80 mg total) by mouth daily. 11/25/20 05/07/21 Yes Kayleen Memos, DO  carvedilol (COREG) 3.125 MG tablet Take 1 tablet (3.125 mg total) by mouth 2 (two) times daily with a meal. 11/25/20 05/07/21 Yes Hall, Lorenda Cahill, DO  doxazosin (CARDURA) 4 MG tablet Take 4 mg by mouth daily. 10/29/20  Yes [provider]  glipiZIDE (GLUCOTROL) 10 MG tablet Take 10 mg by mouth in the morning and at bedtime.   Yes [provider]  levETIRAcetam (KEPPRA) 500 MG tablet Take 1 tablet (500 mg total) by mouth 2 (two) times daily. 11/25/20 05/07/21 Yes Kayleen Memos, DO  metFORMIN (GLUCOPHAGE) 500 MG tablet Take 500 mg by mouth 2 (two) times daily. 12/26/20  Yes [provider]  Accu-Chek Softclix Lancets lancets Use as directed up to 6 times daily. 11/25/20   Kayleen Memos, DO  apixaban (ELIQUIS) 5 MG TABS tablet Take 1 tablet (5 mg total) by mouth 2 (two) times daily. Patient not taking: No sig reported 11/25/20   Kayleen Memos, DO  blood  glucose meter kit and supplies Dispense based on patient and insurance preference. Use up to four times daily as directed. (FOR ICD-10 E10.9, E11.9). 11/25/20   Kayleen Memos, DO  cyanocobalamin (,VITAMIN B-12,) 1000 MCG/ML injection Inject 1 mL (1,000 mcg total) into the muscle every 30 (thirty) days. Patient not taking: No sig reported 12/22/20   Irene Pap N, DO  glucose blood test strip Use as  instructed 11/25/20   Kayleen Memos, DO  hydrALAZINE (APRESOLINE) 50 MG tablet Take 1 tablet (50 mg total) by mouth 3 (three) times daily. Patient not taking: Reported on 05/07/2021 11/25/20 02/23/21  Kayleen Memos, DO  insulin aspart (NOVOLOG) 100 UNIT/ML FlexPen Inject 3 Units into the skin 3 (three) times daily with meals. Patient not taking: No sig reported 11/25/20   Kayleen Memos, DO  insulin glargine (LANTUS) 100 UNIT/ML Solostar Pen Inject 5 Units into the skin daily. Patient not taking: No sig reported 11/25/20   Kayleen Memos, DO  Insulin Pen Needle (PENTIPS) 32G X 4 MM MISC Use  with insulin, 4 (four) times daily - after meals and at bedtime. 11/25/20   Kayleen Memos, DO  linagliptin (TRADJENTA) 5 MG TABS tablet Take 1 tablet (5 mg total) by mouth daily. Patient not taking: No sig reported 11/25/20   Kayleen Memos, DO  NIFEdipine (PROCARDIA XL/NIFEDICAL-XL) 90 MG 24 hr tablet Take 90 mg by mouth daily. Patient not taking: No sig reported 10/29/20   [provider]   Allergies  Allergen Reactions   Penicillins Rash and Swelling    Lips swell   Insulin Lispro Itching   Plavix [Clopidogrel] Palpitations   Review of Systems Unable to obtain.  Denies pain.  Physical Exam General: Sleepy.  In no distress.  Does not participate in conversation  HEENT: No bruits, no goiter, no JVD Heart: Regular rate and rhythm. No murmur appreciated. Lungs: Good air movement, clear Abdomen: Soft, nontender, nondistended, positive bowel sounds.   Ext: + edema Skin: Warm and dry Neuro: Does not  follow commands   Vital Signs: BP 133/63 (BP Location: Right Arm)   Pulse 71   Temp (!) 97.4 F (36.3 C) (Oral)   Resp 18   Ht 6' 4"  (1.93 m)   Wt 88.1 kg   SpO2 100%   BMI 23.64 kg/m  Pain Scale: 0-10 POSS *See Group Information*: S-Acceptable,Sleep, easy to arouse Pain Score: 0-No pain   SpO2: SpO2: 100 % O2 Device:SpO2: 100 % O2 Flow Rate: .   IO: Intake/output summary:  Intake/Output Summary (Last 24 hours) at 05/11/2021 1832 Last data filed at 05/11/2021 1600 Gross per 24 hour  Intake 3421.6 ml  Output --  Net 3421.6 ml    LBM: Last BM Date: 05/06/21 (per patient) Baseline Weight: Weight: 90.2 kg Most recent weight: Weight: 88.1 kg     Palliative Assessment/Data:   Flowsheet Rows    Flowsheet Row Most Recent Value  Intake Tab   Referral Department Hospitalist  Unit at Time of Referral Med/Surg Unit  Palliative Care Primary Diagnosis Cancer  Date Notified 05/11/21  Palliative Care Type New Palliative care  Reason for referral Clarify Goals of Care  Date of Admission 05/06/21  Date first seen by Palliative Care 05/11/21  # of days Palliative referral response time 0 Day(s)  # of days IP prior to Palliative referral 5  Clinical Assessment   Palliative Performance Scale Score 40%  Psychosocial & Spiritual Assessment   Palliative Care Outcomes   Patient/Family meeting held? Yes  Who was at the meeting? Patient       Time In: 1300 Time Out: 1400 Time Total: 60 minutes Greater than 50%  of this time was spent counseling and coordinating care related to the above assessment and plan.  Signed by: Micheline Rough, MD   Please contact Palliative Medicine Team phone at [phone] for questions and concerns.  For individual  provider: See Shea Evans

## 2021-05-11 NOTE — Progress Notes (Signed)
PROGRESS NOTE  Phillip Oconnor  DOB: April 07, 1941  PCP: Jenel Lucks, PA-C [id number]  DOA: 05/06/2021  LOS: 5 days  Hospital Day: 6   Chief Complaint  Patient presents with   Hip Pain    Brief narrative: Phillip Oconnor is a 80 y.o. male with PMH significant for DM2, HTN, HLD, stroke, seizure, chronic bilateral lower extremity edema who lives alone at home. Patient presented to the ED on 9/20 with progressively worsening weakness of right lower extremity and right hip pain with difficulty ambulation for 3 to 4 days.  He noticed right hip pain the day before he had a hernia surgery on 01/23/2021.   Of note, CT scan of abdomen and pelvis done on 01/23/2019 had shown right psoas muscle mass.  Patient was recommended to follow-up with surgeon Dr. Zenia Resides as an outpatient which he missed.  In the ED, blood pressure was elevated 169/63 Labs with unremarkable CBC, serum bicarb low at 15, creatinine elevated to 2.17. CT abdomen pelvis showed 1. Soft tissue mass in the right inferior psoas region has significantly increased in size measuring up to 10.2 cm when compared to CT scan from 3 months ago. 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 2. New small volume ascites, diffuse body wall edema and small bilateral pleural effusions.  Patient was admitted to hospitalist service for further evaluation management. General surgery consultation was obtained.  Subjective: Patient was seen and examined this morning.   Sleepy, opens eyes on verbal command.  Cannot tell his name and date of birth but not oriented to place, person or time.  Remains on IV fluid despite which there is no improvement in his creatinine level.  Assessment/Plan: Right inferior psoas muscle mass  -Case discussed with oncologist Dr. Earlie Server and surgeon Dr. Zenia Resides.  Core biopsy was not recommended because of the possibility of it being a sarcoma.  Noncontrast CT scan and MRI were obtained but the mass could not be  characterized properly without a contrast study. -For last 48 hours, patient has remained on IV fluid with the hope that his creatinine will improve and he will be able to get a contrast study.  But his creatinine is not improving any further. -Discussed with surgeon Dr. Zenia Resides this morning.  Without contrast imaging, unable to tell if this tumor is resectable.  Even if it is resectable, he has to be at a tertiary/academic center for the surgery. -Patient is unable to have a conversation with me this morning about how aggressive he wants to be.  Family not at bedside. -Palliative care consultation called for goals of care identification.  Acute encephalopathy -Patient's mental status has been gradually worsening since hospital stay.  Multifactorial: Hospital stay, worsening uremia, pain medicines. -Currently on as needed Percocet and as needed bedtime trazodone.  Minimize the use of as needed sedatives.  AKI on CKD 3a -Baseline creatinine less than 1.5.  Presented with a creatinine of 2.17. -Initially improved but in last 48 hours, uremia is worsening and creatinine has remained stuck at the peak.  I will reduce the rate of IV fluid at this time to 50 mill per hour.  Encourage oral hydration. Recent Labs    11/23/20 0219 11/24/20 1008 01/22/21 1304 01/23/21 0417 05/06/21 1817 05/07/21 0410 05/08/21 0450 05/09/21 0421 05/10/21 0805 05/11/21 0443  BUN 12 10 15 14  43* 48* 46* 48* 53* 56*  CREATININE 1.50* 1.46* 1.39* 1.32* 2.17* 2.08* 2.02* 1.89* 2.01* 2.03*   Uncontrolled type 2 diabetes mellitus -A1c was  9.6 on April 2022.  It seems to have improved to 6.8 on repeat test this admission.  Probably due to poor appetite. -Home meds include glipizide 10 mg twice daily, metformin 500 mg twice daily.  Apparently was not taking Tradjenta and insulin at home. -Currently oral meds on hold.  Blood sugar was consistently less than 200 but is elevated this morning.  He has poor appetite and is at  risk of hypoglycemia.  I would still not add any longstanding insulin at this time.  Continue to monitor Recent Labs  Lab 05/09/21 1141 05/09/21 1701 05/09/21 2036 05/10/21 1937 05/11/21 0755  GLUCAP 141* 147* 150* 174* 203*   Essential hypertension -Home meds include Coreg 3.125 mg twice daily, doxazosin 4 mg daily  -Currently blood pressure is controlled with the same. -Continue to monitor blood pressure  Chronic lower extremity edema -Patient has pitting edema bilaterally.  Probably worsened in last several days because of immobility.  I do not see any diuretics in his home list.  Technically he would benefit from diuretics in the long-term.  He has been getting IV fluids so far.  I would reduce the rate of IV fluid at this time.  Compression stockings on.  Dyslipidemia History of stroke -Continue aspirin 325 daily, Lipitor 80 mg daily  History of seizure disorder -Continue Keppra 500 mg twice daily  Mobility: Impaired mobility.  PT eval ordered. Code Status:   Code Status: Full Code  Nutritional status: Body mass index is 23.64 kg/m.     Diet:  Diet Order             Diet heart healthy/carb modified Room service appropriate? Yes; Fluid consistency: Thin  Diet effective now                  DVT prophylaxis:  heparin injection 5,000 Units Start: 05/11/21 1400 Place TED hose Start: 05/09/21 1147   Antimicrobials: None Fluid: Normal saline at 50 mill per hour Consultants: General surgery, oncology, palliative care Family Communication: None at bedside  Status is: Inpatient  Remains inpatient appropriate because: Creatinine not improving.  Unable to pursue further imaging to know more about psoas muscle tumor.  Palliative consult called. Dispo: The patient is from: Home              Anticipated d/c is to: Pending clinical course              Patient currently is not medically stable to d/c.   Difficult to place patient No     Infusions:   sodium  chloride 75 mL/hr at 05/11/21 0516    Scheduled Meds:  atorvastatin  80 mg Oral Daily   carvedilol  3.125 mg Oral BID WC   doxazosin  4 mg Oral Daily   heparin injection (subcutaneous)  5,000 Units Subcutaneous Q8H   insulin aspart  0-5 Units Subcutaneous QHS   insulin aspart  0-9 Units Subcutaneous TID WC   levETIRAcetam  500 mg Oral BID    Antimicrobials: Anti-infectives (From admission, onward)    None       PRN meds: acetaminophen **OR** acetaminophen, magnesium hydroxide, ondansetron (ZOFRAN) IV, ondansetron **OR** ondansetron (ZOFRAN) IV, oxyCODONE-acetaminophen, traZODone   Objective: Vitals:   05/11/21 0515 05/11/21 0831  BP: 131/61 (!) 150/67  Pulse: 68 68  Resp: 16   Temp: 97.6 F (36.4 C)   SpO2: 100% 100%    Intake/Output Summary (Last 24 hours) at 05/11/2021 1111 Last data filed at 05/11/2021 0516 Gross  per 24 hour  Intake 2704.39 ml  Output --  Net 2704.39 ml   Filed Weights   05/06/21 1746 05/06/21 2342  Weight: 90.2 kg 88.1 kg   Weight change:  Body mass index is 23.64 kg/m.   Physical Exam: General exam: Pleasant elderly African-American male.  Not in physical pain Skin: No rashes, lesions or ulcers. HEENT: Atraumatic, normocephalic, no obvious bleeding Lungs: Clear to auscultation bilaterally CVS: Regular rate and rhythm, no murmur GI/Abd soft, tenderness present in right lower abdomen, nondistended, bowel sound present. CNS: Sleepy, opens eyes on verbal command.  Able to tell his name and date of birth.  Disoriented otherwise.  Falls right back to sleep Psychiatry: Depressed look today. Extremities: Chronic 1+ bilateral pedal edema, no calf tenderness.  Compression stockings in place.  Data Review: I have personally reviewed the laboratory data and studies available.  Recent Labs  Lab 05/06/21 1817 05/07/21 0410 05/08/21 0450 05/09/21 0421 05/10/21 0805 05/11/21 0443  WBC 9.7 9.7 5.9 8.0 7.2 6.9  NEUTROABS 8.1*  --  4.5 6.2 5.9  5.7  HGB 13.4 11.5* 11.2* 12.0* 11.2* 10.9*  HCT 40.7 36.2* 34.0* 37.2* 35.6* 34.1*  MCV 90.6 92.1 91.6 92.8 93.0 93.7  PLT 171 155 167 156 144* 128*   Recent Labs  Lab 05/07/21 0410 05/08/21 0450 05/09/21 0421 05/10/21 0805 05/11/21 0443  NA 138 137 139 139 138  K 4.5 4.1 4.4 4.5 4.2  CL 111 114* 111 116* 113*  CO2 15* 16* 15* 15* 15*  GLUCOSE 173* 157* 155* 187* 234*  BUN 48* 46* 48* 53* 56*  CREATININE 2.08* 2.02* 1.89* 2.01* 2.03*  CALCIUM 8.9 8.5* 8.6* 8.4* 8.4*    F/u labs ordered Unresulted Labs (From admission, onward)     Start     Ordered   05/11/21 0500  CBC with Differential/Platelet  Daily,   R      05/10/21 0751   05/11/21 0370  Basic metabolic panel  Daily,   R      05/10/21 0751            Signed, Terrilee Croak, MD [facility] 05/11/2021

## 2021-05-12 DIAGNOSIS — Z7189 Other specified counseling: Secondary | ICD-10-CM | POA: Diagnosis not present

## 2021-05-12 DIAGNOSIS — M6289 Other specified disorders of muscle: Secondary | ICD-10-CM | POA: Diagnosis not present

## 2021-05-12 DIAGNOSIS — N179 Acute kidney failure, unspecified: Secondary | ICD-10-CM | POA: Diagnosis not present

## 2021-05-12 DIAGNOSIS — Z515 Encounter for palliative care: Secondary | ICD-10-CM | POA: Diagnosis not present

## 2021-05-12 LAB — CBC WITH DIFFERENTIAL/PLATELET
Abs Immature Granulocytes: 0.03 10*3/uL (ref 0.00–0.07)
Basophils Absolute: 0 10*3/uL (ref 0.0–0.1)
Basophils Relative: 0 %
Eosinophils Absolute: 0 10*3/uL (ref 0.0–0.5)
Eosinophils Relative: 0 %
HCT: 32.6 % — ABNORMAL LOW (ref 39.0–52.0)
Hemoglobin: 10.3 g/dL — ABNORMAL LOW (ref 13.0–17.0)
Immature Granulocytes: 0 %
Lymphocytes Relative: 7 %
Lymphs Abs: 0.4 10*3/uL — ABNORMAL LOW (ref 0.7–4.0)
MCH: 29.1 pg (ref 26.0–34.0)
MCHC: 31.6 g/dL (ref 30.0–36.0)
MCV: 92.1 fL (ref 80.0–100.0)
Monocytes Absolute: 0.8 10*3/uL (ref 0.1–1.0)
Monocytes Relative: 11 %
Neutro Abs: 5.6 10*3/uL (ref 1.7–7.7)
Neutrophils Relative %: 82 %
Platelets: 105 10*3/uL — ABNORMAL LOW (ref 150–400)
RBC: 3.54 MIL/uL — ABNORMAL LOW (ref 4.22–5.81)
RDW: 18 % — ABNORMAL HIGH (ref 11.5–15.5)
WBC: 6.8 10*3/uL (ref 4.0–10.5)
nRBC: 0 % (ref 0.0–0.2)

## 2021-05-12 LAB — GLUCOSE, CAPILLARY
Glucose-Capillary: 165 mg/dL — ABNORMAL HIGH (ref 70–99)
Glucose-Capillary: 164 mg/dL — ABNORMAL HIGH (ref 70–99)
Glucose-Capillary: 174 mg/dL — ABNORMAL HIGH (ref 70–99)
Glucose-Capillary: 184 mg/dL — ABNORMAL HIGH (ref 70–99)
Glucose-Capillary: 220 mg/dL — ABNORMAL HIGH (ref 70–99)
Glucose-Capillary: 196 mg/dL — ABNORMAL HIGH (ref 70–99)
Glucose-Capillary: 187 mg/dL — ABNORMAL HIGH (ref 70–99)
Glucose-Capillary: 179 mg/dL — ABNORMAL HIGH (ref 70–99)
Glucose-Capillary: 160 mg/dL — ABNORMAL HIGH (ref 70–99)

## 2021-05-12 LAB — BASIC METABOLIC PANEL
Anion gap: 9 (ref 5–15)
BUN: 51 mg/dL — ABNORMAL HIGH (ref 8–23)
CO2: 14 mmol/L — ABNORMAL LOW (ref 22–32)
Calcium: 8.4 mg/dL — ABNORMAL LOW (ref 8.9–10.3)
Chloride: 118 mmol/L — ABNORMAL HIGH (ref 98–111)
Creatinine, Ser: 2.02 mg/dL — ABNORMAL HIGH (ref 0.61–1.24)
GFR, Estimated: 32 mL/min — ABNORMAL LOW (ref >60)
Glucose, Bld: 229 mg/dL — ABNORMAL HIGH (ref 70–99)
Potassium: 4.1 mmol/L (ref 3.5–5.1)
Sodium: 141 mmol/L (ref 135–145)

## 2021-05-12 NOTE — TOC Progression Note (Addendum)
Transition of Care [facility]) - Progression Note    Patient Details  Name: ALWYN CORDNER MRN: [id number] Date of Birth: 1941-02-07  Transition of [facility]) CM/SW Contact  Katasha Riga, Juliann Pulse, RN Phone Number: 05/12/2021, 1:56 PM  Clinical Narrative: Left vm w/[facility] rep Kim-following for [facility] SNF there-await call back-patient not medically stable today. Will need covid within 24-48hrs of d/c. Spoke to brother [name] about process for choosing ST SNF facility-voiced understanding.     Expected Discharge Plan: [place] Barriers to Discharge: Continued Medical Work up  Expected Discharge Plan and Services Expected Discharge Plan: [place]   Discharge Planning Services: CM Consult Post Acute Care Choice: [place] Living arrangements for the past 2 months: Single Family Home                                       Social Determinants of Health (SDOH) Interventions    Readmission Risk Interventions No flowsheet data found.

## 2021-05-12 NOTE — Plan of Care (Signed)
  Problem: Education: Goal: Knowledge of General Education information will improve Description: Including pain rating scale, medication(s)/side effects and non-pharmacologic comfort measures Outcome: Progressing   Problem: Clinical Measurements: Goal: Ability to maintain clinical measurements within normal limits will improve Outcome: Progressing Goal: Will remain free from infection Outcome: Progressing Goal: Diagnostic test results will improve Outcome: Progressing Goal: Respiratory complications will improve Outcome: Progressing Goal: Cardiovascular complication will be avoided Outcome: Progressing   Problem: Activity: Goal: Risk for activity intolerance will decrease Outcome: Progressing   Problem: Coping: Goal: Level of anxiety will decrease Outcome: Progressing   Problem: Elimination: Goal: Will not experience complications related to bowel motility Outcome: Progressing   Problem: Pain Managment: Goal: General experience of comfort will improve Outcome: Progressing   Problem: Safety: Goal: Ability to remain free from injury will improve Outcome: Progressing   Problem: Skin Integrity: Goal: Risk for impaired skin integrity will decrease Outcome: Progressing

## 2021-05-12 NOTE — Progress Notes (Signed)
   Progress Note     Subjective: Patient denies pain this AM.  When asked if he understood what was going on and if he would want aggressive intervention such as surgery or chemotherapy etc. He stated no.  Objective: Vital signs in last 24 hours: Temp:  [97.4 F (36.3 C)-99 F (37.2 C)] 98 F (36.7 C) (09/26 0525) Pulse Rate:  [63-83] 63 (09/26 0525) Resp:  [17-18] 18 (09/26 0525) BP: (130-133)/(63-76) 131/72 (09/26 0525) SpO2:  [99 %-100 %] 100 % (09/26 0525) Last BM Date: 05/06/21 (per patient)  Intake/Output from previous day: 09/25 0701 - 09/26 0700 In: 1594.6 [P.O.:120; I.V.:1374.6] Out: -  Intake/Output this shift: No intake/output data recorded.  PE: General: pleasant, NAD Abd: soft, NT, ND   Lab Results:  Recent Labs    05/11/21 0443 05/12/21 0409  WBC 6.9 6.8  HGB 10.9* 10.3*  HCT 34.1* 32.6*  PLT 128* 105*   BMET Recent Labs    05/11/21 0443 05/12/21 0409  NA 138 141  K 4.2 4.1  CL 113* 118*  CO2 15* 14*  GLUCOSE 234* 229*  BUN 56* 51*  CREATININE 2.03* 2.02*  CALCIUM 8.4* 8.4*   PT/INR No results for input(s): LABPROT, INR in the last 72 hours. CMP     Component Value Date/Time   NA 141 05/12/2021 0409   K 4.1 05/12/2021 0409   CL 118 (H) 05/12/2021 0409   CO2 14 (L) 05/12/2021 0409   GLUCOSE 229 (H) 05/12/2021 0409   BUN 51 (H) 05/12/2021 0409   CREATININE 2.02 (H) 05/12/2021 0409   CALCIUM 8.4 (L) 05/12/2021 0409   PROT 6.8 05/06/2021 1817   ALBUMIN 3.0 (L) 05/06/2021 1817   AST 14 (L) 05/06/2021 1817   ALT 9 05/06/2021 1817   ALKPHOS 120 05/06/2021 1817   BILITOT 0.8 05/06/2021 1817   GFRNONAA 32 (L) 05/12/2021 0409   Lipase     Component Value Date/Time   LIPASE 29 05/06/2021 1817       Studies/Results: No results found.  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  D/W palliative care, Dr. Domingo Cocking.  Will let him see him today to see what type of further plans are discussed. -will follow up   FEN: HH/CM diet VTE: SCDs, LMWH ID: no current abx   AKI on CKD stage III - Cr 2.02, baseline appears to be around 1.4 Atrial fibrillation  HTN HLD Hx of CVA Hx of seizures T2DM  LOS: 6 days    Henreitta Cea, [facility] 05/12/2021, 12:01 PM Please see Amion for pager number during day hours 7:00am-4:30pm

## 2021-05-12 NOTE — Progress Notes (Signed)
Physical Therapy Treatment Patient Details Name: Phillip Oconnor MRN: [id number] DOB: [birth year] Today's Date: 05/12/2021   History of Present Illness 80 y.o. male  who presented to the ER 05/06/21 with acute onset of increased weakness with difficulty ambulation due to right lower extremity weakness and pain at the hip. Dx of R inferior psoas muscle mass, AKI. Pt with medical history significant for DM2, HTN, dyslipidemia, seizures and stroke.  Pt has become more lethargic/confused over the last couple of days and palliative consulted for goals of care.    PT Comments    Pt requiring increased assist for bed mobility today and unable to stand with +2 assist.  Pt limited by fatigue and decreased cognition.  Nurse tech assisted with repositioning pt in bed and attempted to feed pt end of session.  Pt may benefit from SNF upon d/c due to increased assist level.   Recommendations for follow up therapy are one component of a multi-disciplinary discharge planning process, led by the attending physician.  Recommendations may be updated based on patient status, additional functional criteria and insurance authorization.  Follow Up Recommendations  SNF     Equipment Recommendations  None recommended by PT    Recommendations for Other Services       Precautions / Restrictions Precautions Precautions: Fall     Mobility  Bed Mobility Overal bed mobility: Needs Assistance Bed Mobility: Supine to Sit;Sit to Supine     Supine to sit: Max assist Sit to supine: Max assist;+2 for safety/equipment   General bed mobility comments: pt initiating small movement however requiring assist; pt attempting to return to supine by lowering trunk    Transfers Overall transfer level: Needs assistance Equipment used: Rolling walker (2 wheeled) Transfers: Sit to/from Stand Sit to Stand: Total assist         General transfer comment: attempted x3 however pt not assisting despite provided positioning  and cues for technique; pt not able to rise buttocks from bed; would need lift equipment for safely OOB  Ambulation/Gait                 Stairs             Wheelchair Mobility    Modified Rankin (Stroke Patients Only)       Balance Overall balance assessment: Needs assistance Sitting-balance support: No upper extremity supported;Feet supported Sitting balance-Leahy Scale: Poor Sitting balance - Comments: pt occasionally able to maintain upright posture however inconsistent with assist required                                    Cognition Arousal/Alertness: Lethargic Behavior During Therapy: Flat affect Overall Cognitive Status: No family/caregiver present to determine baseline cognitive functioning                                 General Comments: pt sleeping on arrival, able to maintain open eyes once sitting EOB and end of session, pt not answering questions, little verbalization today      Exercises      General Comments        Pertinent Vitals/Pain Pain Assessment: Faces Faces Pain Scale: Hurts even more Pain Location: R hip/groin Pain Descriptors / Indicators: Grimacing;Guarding Pain Intervention(s): Monitored during session;Repositioned    Home Living  Prior Function            PT Goals (current goals can now be found in the care plan section) Progress towards PT goals: Not progressing toward goals - comment (fatigue, cognition limiting)    Frequency    Min 2X/week      PT Plan Current plan remains appropriate    Co-evaluation              AM-PAC PT "6 Clicks" Mobility   Outcome Measure  Help needed turning from your back to your side while in a flat bed without using bedrails?: Total Help needed moving from lying on your back to sitting on the side of a flat bed without using bedrails?: Total Help needed moving to and from a bed to a chair (including a  wheelchair)?: Total Help needed standing up from a chair using your arms (e.g., wheelchair or bedside chair)?: Total Help needed to walk in hospital room?: Total Help needed climbing 3-5 steps with a railing? : Total 6 Click Score: 6    End of Session Equipment Utilized During Treatment: Gait belt Activity Tolerance: Patient limited by fatigue (limited by cognition) Patient left: in bed;with call bell/phone within reach;with bed alarm set;with nursing/sitter in room Nurse Communication: Mobility status PT Visit Diagnosis: Muscle weakness (generalized) (M62.81);Adult, failure to thrive (R62.7)     Time: 0957-1019 PT Time Calculation (min) (ACUTE ONLY): 22 min  Charges:  $Therapeutic Activity: 8-22 mins                    Jannette Spanner PT, DPT Acute Rehabilitation Services Pager: [phone] Office: [place] 05/12/2021, 3:59 PM

## 2021-05-12 NOTE — Progress Notes (Signed)
PROGRESS NOTE  Phillip Oconnor  DOB: 1940-09-15  PCP: Jenel Lucks, PA-C JOI:[id number]  DOA: 05/06/2021  LOS: 6 days  Hospital Day: 7   Chief Complaint  Patient presents with   Hip Pain    Brief narrative: Phillip Oconnor is a 80 y.o. male with PMH significant for DM2, HTN, HLD, stroke, seizure, chronic bilateral lower extremity edema who lives alone at home. Patient presented to the ED on 9/20 with progressively worsening weakness of right lower extremity and right hip pain with difficulty ambulation for 3 to 4 days.  He noticed right hip pain the day before he had a hernia surgery on 01/23/2021.   Of note, CT scan of abdomen and pelvis done on 01/23/2019 had shown right psoas muscle mass.  Patient was recommended to follow-up with surgeon Dr. Zenia Resides as an outpatient which he missed.  In the ED, blood pressure was elevated 169/63 Labs with unremarkable CBC, serum bicarb low at 15, creatinine elevated to 2.17. CT abdomen pelvis showed 1. Soft tissue mass in the right inferior psoas region has significantly increased in size measuring up to 10.2 cm when compared to CT scan from 3 months ago. 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 2. New small volume ascites, diffuse body wall edema and small bilateral pleural effusions.  Patient was admitted to hospitalist service for further evaluation management. General surgery consultation was obtained.  Subjective: Patient was seen and examined this morning.   Remains sleepy and not able to have a good conversation. Pending palliative care meeting with the family.  Assessment/Plan: Right inferior psoas muscle mass  -Case discussed with oncologist Dr. Earlie Server and surgeon Dr. Zenia Resides.  Core biopsy was not recommended because of the possibility of it being a sarcoma.  Noncontrast CT scan and MRI were obtained but the mass could not be characterized properly without a contrast study. -For last 48 hours, patient has remained on IV fluid with  the hope that his creatinine will improve and he will be able to get a contrast study.  But his creatinine is not improving any further. -Discussed with surgeon Dr. Zenia Resides this morning.  Without contrast imaging, unable to tell if this tumor is resectable.  Even if it is resectable, he has to be at a tertiary/academic center for the surgery. -Patient is unable to have a comprehensive understanding of the risk and benefit of an aggressive strategy.   -Palliative care consultation called for goals of care identification.  Acute encephalopathy -Patient's mental status has been gradually worsening since hospital stay.  Multifactorial: Hospital stay, worsening uremia, pain medicines. -Currently on as needed Percocet and as needed bedtime trazodone.  Minimize the use of as needed sedatives.  AKI on CKD 3a -Baseline creatinine less than 1.5.  Presented with a creatinine of 2.17. -Initially improved but in last 48 hours, uremia is worsening and creatinine has remained stuck at the peak.  I will stop IV fluid at this time.   Recent Labs    11/24/20 1008 01/22/21 1304 01/23/21 0417 05/06/21 1817 05/07/21 0410 05/08/21 0450 05/09/21 0421 05/10/21 0805 05/11/21 0443 05/12/21 0409  BUN 10 15 14  43* 48* 46* 48* 53* 56* 51*  CREATININE 1.46* 1.39* 1.32* 2.17* 2.08* 2.02* 1.89* 2.01* 2.03* 2.02*   Uncontrolled type 2 diabetes mellitus -A1c was 9.6 on April 2022.  It seems to have improved to 6.8 on repeat test this admission.  Probably due to poor appetite. -Home meds include glipizide 10 mg twice daily, metformin 500 mg twice  daily.  Apparently was not taking Tradjenta and insulin at home. -Currently oral meds on hold.  Blood sugar was consistently less than 200.  He has poor appetite and is at risk of hypoglycemia.  I would still not add any longstanding insulin at this time.  Continue to monitor Recent Labs  Lab 05/11/21 1136 05/11/21 1633 05/11/21 2131 05/12/21 0751 05/12/21 1157  GLUCAP  220* 176* 195* 196* 187*   Essential hypertension -Home meds include Coreg 3.125 mg twice daily, doxazosin 4 mg daily  -Currently blood pressure is controlled with the same. -Continue to monitor blood pressure  Chronic lower extremity edema -Patient has pitting edema bilaterally.  Probably worsened in last several days because of immobility.  I do not see any diuretics in his home list.  Technically he would benefit from diuretics in the long-term.  Currently compression stockings on.  IV fluid is stopped today.  Dyslipidemia History of stroke -Continue aspirin 325 daily, Lipitor 80 mg daily  History of seizure disorder -Continue Keppra 500 mg twice daily  Mobility: Impaired mobility.  PT eval ordered. Code Status:   Code Status: Full Code  Nutritional status: Body mass index is 23.64 kg/m.     Diet:  Diet Order             Diet heart healthy/carb modified Room service appropriate? Yes; Fluid consistency: Thin  Diet effective now                  DVT prophylaxis:  heparin injection 5,000 Units Start: 05/12/21 0600 Place TED hose Start: 05/09/21 1147   Antimicrobials: None Fluid: IV fluids stopped today. Consultants: General surgery, oncology, palliative care Family Communication: None at bedside  Status is: Inpatient  Remains inpatient appropriate because: Creatinine not improving.  Unable to pursue further imaging to know more about psoas muscle tumor.  Palliative consult meeting with family this afternoon.. Dispo: The patient is from: Home              Anticipated d/c is to: Pending clinical course              Patient currently is not medically stable to d/c.   Difficult to place patient No     Infusions:     Scheduled Meds:  atorvastatin  80 mg Oral Daily   doxazosin  4 mg Oral Daily   heparin injection (subcutaneous)  5,000 Units Subcutaneous Q8H   insulin aspart  0-5 Units Subcutaneous QHS   insulin aspart  0-9 Units Subcutaneous TID WC    levETIRAcetam  500 mg Oral BID    Antimicrobials: Anti-infectives (From admission, onward)    None       PRN meds: acetaminophen **OR** acetaminophen, magnesium hydroxide, ondansetron (ZOFRAN) IV, ondansetron **OR** ondansetron (ZOFRAN) IV, oxyCODONE-acetaminophen, traZODone   Objective: Vitals:   05/12/21 0525 05/12/21 1215  BP: 131/72 132/69  Pulse: 63 74  Resp: 18 16  Temp: 98 F (36.7 C) 97.6 F (36.4 C)  SpO2: 100% 100%    Intake/Output Summary (Last 24 hours) at 05/12/2021 1316 Last data filed at 05/12/2021 1257 Gross per 24 hour  Intake 1594.63 ml  Output 600 ml  Net 994.63 ml   Filed Weights   05/06/21 1746 05/06/21 2342  Weight: 90.2 kg 88.1 kg   Weight change:  Body mass index is 23.64 kg/m.   Physical Exam: General exam: Pleasant elderly African-American male.  Not in physical pain Skin: No rashes, lesions or ulcers. HEENT: Atraumatic, normocephalic, no obvious  bleeding Lungs: Clear to auscultation bilaterally CVS: Regular rate and rhythm, no murmur GI/Abd soft, tenderness present in right lower abdomen, nondistended, bowel sound present. CNS: Sleepy, opens eyes on verbal command.  Only able to tell me his name and date of birth.  Not oriented.  Falls back to sleep right away . psychiatry: Depressed look today. Extremities: Chronic 1+ bilateral pedal edema, no calf tenderness.  Compression stockings in place.  Data Review: I have personally reviewed the laboratory data and studies available.  Recent Labs  Lab 05/08/21 0450 05/09/21 0421 05/10/21 0805 05/11/21 0443 05/12/21 0409  WBC 5.9 8.0 7.2 6.9 6.8  NEUTROABS 4.5 6.2 5.9 5.7 5.6  HGB 11.2* 12.0* 11.2* 10.9* 10.3*  HCT 34.0* 37.2* 35.6* 34.1* 32.6*  MCV 91.6 92.8 93.0 93.7 92.1  PLT 167 156 144* 128* 105*   Recent Labs  Lab 05/08/21 0450 05/09/21 0421 05/10/21 0805 05/11/21 0443 05/12/21 0409  NA 137 139 139 138 141  K 4.1 4.4 4.5 4.2 4.1  CL 114* 111 116* 113* 118*  CO2 16*  15* 15* 15* 14*  GLUCOSE 157* 155* 187* 234* 229*  BUN 46* 48* 53* 56* 51*  CREATININE 2.02* 1.89* 2.01* 2.03* 2.02*  CALCIUM 8.5* 8.6* 8.4* 8.4* 8.4*    F/u labs ordered Unresulted Labs (From admission, onward)     Start     Ordered   05/11/21 0500  CBC with Differential/Platelet  Daily,   R      05/10/21 0751   05/11/21 2778  Basic metabolic panel  Daily,   R      05/10/21 0751            Signed, Terrilee Croak, MD [facility] 05/12/2021

## 2021-05-12 NOTE — Plan of Care (Signed)
  Problem: Clinical Measurements: Goal: Ability to maintain clinical measurements within normal limits will improve Outcome: Progressing   Problem: Activity: Goal: Risk for activity intolerance will decrease Outcome: Progressing   Problem: Nutrition: Goal: Adequate nutrition will be maintained Outcome: Progressing   Problem: Elimination: Goal: Will not experience complications related to bowel motility Outcome: Progressing Goal: Will not experience complications related to urinary retention Outcome: Progressing   Problem: Pain Managment: Goal: General experience of comfort will improve Outcome: Progressing

## 2021-05-13 DIAGNOSIS — M6289 Other specified disorders of muscle: Secondary | ICD-10-CM | POA: Diagnosis not present

## 2021-05-13 DIAGNOSIS — Z515 Encounter for palliative care: Secondary | ICD-10-CM | POA: Diagnosis not present

## 2021-05-13 DIAGNOSIS — R531 Weakness: Secondary | ICD-10-CM | POA: Diagnosis not present

## 2021-05-13 DIAGNOSIS — Z7189 Other specified counseling: Secondary | ICD-10-CM | POA: Diagnosis not present

## 2021-05-13 LAB — CBC WITH DIFFERENTIAL/PLATELET
Abs Immature Granulocytes: 0.03 10*3/uL (ref 0.00–0.07)
Basophils Absolute: 0 10*3/uL (ref 0.0–0.1)
Basophils Relative: 0 %
Eosinophils Absolute: 0 10*3/uL (ref 0.0–0.5)
Eosinophils Relative: 0 %
HCT: 33 % — ABNORMAL LOW (ref 39.0–52.0)
Hemoglobin: 10.5 g/dL — ABNORMAL LOW (ref 13.0–17.0)
Immature Granulocytes: 0 %
Lymphocytes Relative: 7 %
Lymphs Abs: 0.6 10*3/uL — ABNORMAL LOW (ref 0.7–4.0)
MCH: 29.8 pg (ref 26.0–34.0)
MCHC: 31.8 g/dL (ref 30.0–36.0)
MCV: 93.8 fL (ref 80.0–100.0)
Monocytes Absolute: 1 10*3/uL (ref 0.1–1.0)
Monocytes Relative: 13 %
Neutro Abs: 5.9 10*3/uL (ref 1.7–7.7)
Neutrophils Relative %: 80 %
Platelets: 99 10*3/uL — ABNORMAL LOW (ref 150–400)
RBC: 3.52 MIL/uL — ABNORMAL LOW (ref 4.22–5.81)
RDW: 18.6 % — ABNORMAL HIGH (ref 11.5–15.5)
WBC: 7.6 10*3/uL (ref 4.0–10.5)
nRBC: 0.3 % — ABNORMAL HIGH (ref 0.0–0.2)

## 2021-05-13 LAB — GLUCOSE, CAPILLARY
Glucose-Capillary: 209 mg/dL — ABNORMAL HIGH (ref 70–99)
Glucose-Capillary: 202 mg/dL — ABNORMAL HIGH (ref 70–99)
Glucose-Capillary: 212 mg/dL — ABNORMAL HIGH (ref 70–99)
Glucose-Capillary: 172 mg/dL — ABNORMAL HIGH (ref 70–99)

## 2021-05-13 LAB — BASIC METABOLIC PANEL
Anion gap: 8 (ref 5–15)
BUN: 53 mg/dL — ABNORMAL HIGH (ref 8–23)
CO2: 16 mmol/L — ABNORMAL LOW (ref 22–32)
Calcium: 8.7 mg/dL — ABNORMAL LOW (ref 8.9–10.3)
Chloride: 120 mmol/L — ABNORMAL HIGH (ref 98–111)
Creatinine, Ser: 1.71 mg/dL — ABNORMAL HIGH (ref 0.61–1.24)
GFR, Estimated: 39 mL/min — ABNORMAL LOW (ref >60)
Glucose, Bld: 237 mg/dL — ABNORMAL HIGH (ref 70–99)
Potassium: 4.3 mmol/L (ref 3.5–5.1)
Sodium: 144 mmol/L (ref 135–145)

## 2021-05-13 MED ORDER — INSULIN GLARGINE-YFGN 100 UNIT/ML ~~LOC~~ SOLN
4.0000 [IU] | Freq: Every day | SUBCUTANEOUS | Status: DC
  Administered 2021-05-13: 4 [IU] via SUBCUTANEOUS
  Filled 2021-05-13: qty 0.04

## 2021-05-13 MED ORDER — ORAL CARE MOUTH RINSE
15.0000 mL | Freq: Two times a day (BID) | OROMUCOSAL | Status: DC
  Administered 2021-05-13 – 2021-05-14 (×3): 15 mL via OROMUCOSAL

## 2021-05-13 NOTE — Progress Notes (Signed)
Inpatient Diabetes Program Recommendations  AACE/ADA: New Consensus Statement on Inpatient Glycemic Control (2015)  Target Ranges:  Prepandial:   less than 140 mg/dL      Peak postprandial:   less than 180 mg/dL (1-2 hours)      Critically ill patients:  140 - 180 mg/dL   Lab Results  Component Value Date   GLUCAP 209 (H) 05/13/2021   HGBA1C 6.8 (H) 05/07/2021    Review of Glycemic Control  Diabetes history: DM2 Outpatient Diabetes medications: Lantus 5 QD, Novolog 3 units TID, glipizide 10 BID, metformin 500 BID Current orders for Inpatient glycemic control: Novolog 0-9 units TID with meals and 0-5 HS  HgbA1C - 6.8%  CBGs today: 237, 209 mg/dL May benefit from adding basal insulin  Inpatient Diabetes Program Recommendations:    Consider adding Semglee 4 units QD  Continue to follow glucose trends.  Thank you. Lorenda Peck, RD, LDN, CDE Inpatient Diabetes Coordinator [phone]

## 2021-05-13 NOTE — Plan of Care (Signed)
  Problem: Activity: Goal: Risk for activity intolerance will decrease Outcome: Progressing   Problem: Nutrition: Goal: Adequate nutrition will be maintained Outcome: Progressing   Problem: Elimination: Goal: Will not experience complications related to bowel motility Outcome: Progressing Goal: Will not experience complications related to urinary retention Outcome: Progressing   

## 2021-05-13 NOTE — Progress Notes (Signed)
Appreciate palliative care assistance with this patient.  Will continue to follow and chart check, but based on what patient told me yesterday and his overall health and mental state, the patient is not a great surgical candidate, if this area where to be resectable.  He would require further work up including further imaging, which hasn't been able to be done yet due to his kidneys, and if it were to be resectable he would need a tertiary care facility.  An aim for quality of life is likely in the patient's best interest at this point.  We will continue to follow but don't really have anything addition to add at this time.  Henreitta Cea 7:19 AM 05/13/2021

## 2021-05-13 NOTE — Progress Notes (Signed)
PROGRESS NOTE  Phillip Oconnor  DOB: 01-22-1941  PCP: Jenel Lucks, PA-C ZCH:[id number]  DOA: 05/06/2021  LOS: 7 days  Hospital Day: 8   Chief Complaint  Patient presents with   Hip Pain    Brief narrative: Phillip Oconnor is a 80 y.o. male with PMH significant for DM2, HTN, HLD, stroke, seizure, chronic bilateral lower extremity edema who lives alone at home. Patient presented to the ED on 9/20 with progressively worsening weakness of right lower extremity and right hip pain with difficulty ambulation for 3 to 4 days.  He noticed right hip pain the day before he had a hernia surgery on 01/23/2021.   Of note, CT scan of abdomen and pelvis done on 01/23/2019 had shown right psoas muscle mass.  Patient was recommended to follow-up with surgeon Dr. Zenia Resides as an outpatient which he missed.  In the ED, blood pressure was elevated 169/63 Labs with unremarkable CBC, serum bicarb low at 15, creatinine elevated to 2.17. CT abdomen pelvis showed 1. Soft tissue mass in the right inferior psoas region has significantly increased in size measuring up to 10.2 cm when compared to CT scan from 3 months ago. 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 2. New small volume ascites, diffuse body wall edema and small bilateral pleural effusions.  Patient was admitted to hospitalist service for further evaluation management. General surgery consultation was obtained. See below for details.  Subjective: Patient was seen and examined this morning.   Alert, awake, but very slow to respond.  Not oriented to person place or time. Remains sleepy and not able to have a good conversation.  Palliative care following.  No family at bedside.  Assessment/Plan: Right inferior psoas muscle mass  -I discussed the case with oncologist Dr. Earlie Server and surgeon Dr. Zenia Resides.  Core biopsy was not recommended because of the possibility of it being a sarcoma.  Noncontrast CT scan and MRI were obtained but the mass could not  be characterized properly without a contrast study. -Plan was to hydrate him to bring the creatinine down and get a contrast study.  We hydrated the patient for 72 hours despite which his creatinine did not significantly improve.  His mental status rather got worse.  At this time is not able to have any conversation regarding cancer and the treatment plan. -Discussed with surgery team.  Without contrast imaging, unable to tell if this tumor is resectable.  Even if it is resectable, he has to be at a tertiary/academic center for the surgery. -Patient is unable to have a comprehensive understanding of the risk and benefit of an aggressive strategy.   -Palliative care consultation following.  Acute encephalopathy -Patient's mental status has been gradually worsening since hospital stay.  Multifactorial: Hospital stay, worsening uremia, pain medicines. -Currently on as needed Percocet and as needed bedtime trazodone.  Minimize the use of as needed sedatives.  AKI on CKD 3a -Baseline creatinine less than 1.5.  Presented with a creatinine of 2.17. -Creatinine seems to be somewhat better in last 24 hours but I do not know how much will that improved his overall outcome. Recent Labs    01/22/21 1304 01/23/21 0417 05/06/21 1817 05/07/21 0410 05/08/21 0450 05/09/21 0421 05/10/21 0805 05/11/21 0443 05/12/21 0409 05/13/21 0505  BUN 15 14 43* 48* 46* 48* 53* 56* 51* 53*  CREATININE 1.39* 1.32* 2.17* 2.08* 2.02* 1.89* 2.01* 2.03* 2.02* 1.71*    Uncontrolled type 2 diabetes mellitus -A1c was 9.6 on April 2022.  It seems  to have improved to 6.8 on repeat test this admission.  Probably due to poor appetite. -Home meds include glipizide 10 mg twice daily, metformin 500 mg twice daily. -Blood sugars running over 200.  We will start on Lantus 4 units daily.  Continue sliding scale insulin with Accu-Cheks. Recent Labs  Lab 05/12/21 1641 05/12/21 2022 05/13/21 0735 05/13/21 1116 05/13/21 1611   GLUCAP 179* 160* 209* 202* 212*    Essential hypertension -Home meds include Coreg 3.125 mg twice daily, doxazosin 4 mg daily  -Currently blood pressure is controlled with the same. -Continue to monitor blood pressure  Chronic lower extremity edema -Patient has pitting edema bilaterally.  Probably worsened in last several days because of immobility.  I do not see any diuretics in his home list.  Technically he would benefit from diuretics in the long-term.  Currently compression stockings on.  IV fluid is stopped today.  Dyslipidemia History of stroke -Continue aspirin 325 daily, Lipitor 80 mg daily  History of seizure disorder -Continue Keppra 500 mg twice daily  Mobility: Impaired mobility.  PT eval ordered. Code Status:   Code Status: Full Code  Nutritional status: Body mass index is 23.64 kg/m.     Diet:  Diet Order             Diet heart healthy/carb modified Room service appropriate? Yes; Fluid consistency: Thin  Diet effective now                  DVT prophylaxis:  heparin injection 5,000 Units Start: 05/12/21 0600 Place TED hose Start: 05/09/21 1147   Antimicrobials: None Fluid: Not on IV fluid Consultants: General surgery, oncology, palliative care Family Communication: None at bedside  Status is: Inpatient  Remains inpatient appropriate because: Pending treatment plan for his tumor.  Palliative care following.   Dispo: The patient is from: Home              Anticipated d/c is to: Pending clinical course              Patient currently is not medically stable to d/c.   Difficult to place patient No     Infusions:     Scheduled Meds:  atorvastatin  80 mg Oral Daily   doxazosin  4 mg Oral Daily   heparin injection (subcutaneous)  5,000 Units Subcutaneous Q8H   insulin aspart  0-5 Units Subcutaneous QHS   insulin aspart  0-9 Units Subcutaneous TID WC   insulin glargine-yfgn  4 Units Subcutaneous QHS   levETIRAcetam  500 mg Oral BID   mouth  rinse  15 mL Mouth Rinse BID    Antimicrobials: Anti-infectives (From admission, onward)    None       PRN meds: acetaminophen **OR** acetaminophen, magnesium hydroxide, ondansetron (ZOFRAN) IV, ondansetron **OR** ondansetron (ZOFRAN) IV, oxyCODONE-acetaminophen, traZODone   Objective: Vitals:   05/13/21 0422 05/13/21 1334  BP: 134/62 (!) 134/58  Pulse: 74 71  Resp: 16 (!) 22  Temp: 98.5 F (36.9 C) 97.7 F (36.5 C)  SpO2: 100% 100%    Intake/Output Summary (Last 24 hours) at 05/13/2021 1627 Last data filed at 05/13/2021 0956 Gross per 24 hour  Intake 778 ml  Output 225 ml  Net 553 ml    Filed Weights   05/06/21 1746 05/06/21 2342  Weight: 90.2 kg 88.1 kg   Weight change:  Body mass index is 23.64 kg/m.   Physical Exam: General exam: Pleasant elderly African-American male.  Not in physical distress Skin:  No rashes, lesions or ulcers. HEENT: Atraumatic, normocephalic, no obvious bleeding Lungs: Clear to auscultation bilaterally CVS: Regular rate and rhythm, no murmur GI/Abd soft, tenderness present in right lower abdomen, nondistended, bowel sound present. CNS: Alert, awake, slow to respond.  Completely disoriented.  Not restless or agitated. Psychiatry: Depressed look today. Extremities: Chronic 1+ bilateral pedal edema, no calf tenderness.  Compression stockings in place.  Data Review: I have personally reviewed the laboratory data and studies available.  Recent Labs  Lab 05/09/21 0421 05/10/21 0805 05/11/21 0443 05/12/21 0409 05/13/21 0505  WBC 8.0 7.2 6.9 6.8 7.6  NEUTROABS 6.2 5.9 5.7 5.6 5.9  HGB 12.0* 11.2* 10.9* 10.3* 10.5*  HCT 37.2* 35.6* 34.1* 32.6* 33.0*  MCV 92.8 93.0 93.7 92.1 93.8  PLT 156 144* 128* 105* 99*    Recent Labs  Lab 05/09/21 0421 05/10/21 0805 05/11/21 0443 05/12/21 0409 05/13/21 0505  NA 139 139 138 141 144  K 4.4 4.5 4.2 4.1 4.3  CL 111 116* 113* 118* 120*  CO2 15* 15* 15* 14* 16*  GLUCOSE 155* 187* 234* 229*  237*  BUN 48* 53* 56* 51* 53*  CREATININE 1.89* 2.01* 2.03* 2.02* 1.71*  CALCIUM 8.6* 8.4* 8.4* 8.4* 8.7*     F/u labs ordered Unresulted Labs (From admission, onward)    None       Signed, Terrilee Croak, MD [facility] 05/13/2021

## 2021-05-13 NOTE — Care Management Important Message (Signed)
Important Message  Patient Details IM Letter given to the Patient. Name: Phillip Oconnor MRN: [id number] Date of Birth: Sep 15, 1940   Medicare Important Message Given:  Yes     Kerin Salen 05/13/2021, 10:43 AM

## 2021-05-13 NOTE — Progress Notes (Signed)
Daily Progress Note   Patient Name: Phillip Oconnor       Date: 05/13/2021 DOB: 1941/06/08  Age: 80 y.o. MRN#: [id number] Attending Physician: Terrilee Croak, MD Primary Care Physician: Marinda Elk Admit Date: 05/06/2021  Reason for Consultation/Follow-up: Establishing goals of care  Subjective: Received call from RN the patient's brother was at the bedside and wanting to discuss patient's care.  I met today with patient's brother, Phillip Oconnor.  We discussed plan of care at [facility] bedside.  Phillip Oconnor was sleepy at that time and despite attempts to engage him in conversation multiple times he was not able (or maybe not willing?) to wake up enough to participate in conversation.  I talked with his brother about his clinical course and concern about advancement of mass in his psoas muscle over the past 3 months.  We discussed options for care including more aggressive interventions which would potentially include things such as surgery and chemotherapy versus focusing on aggressive symptom management and more of a focus on comfort through hospice services.  Phillip Oconnor feels that if he were to truly understand his situation, his brother would choose to focus more on quality of life and avoid overly invasive procedures, particularly as there is not a guarantee they will be successful to add time or quality to his life.  He also feels that if he were to understand his situation, that he would not want heroic interventions in the event of cardiac or respiratory arrest.  We discussed plan for me to stop back later to check in to see if he is more able to participate in conversation.  If so, I would continue to engage with Phillip Oconnor regarding his desires for care.  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  In either case, Phillip Oconnor feels that he will need to go to rehab to see if he can regain functional status prior to returning home when he leaves the hospital.  I stopped back by and attempted to meet with Phillip Oconnor later in the afternoon.  He was more awake but he seemed reluctant to fully participate in conversation with  me.  He was easily agitated.  I tried to talk with him about his situation and options for care and he stated he needs time to think.  I tried to ascertain his understanding of his situation but he would not engage enough for me to really determine if he understands what is going on or not.  As it is still unclear to me if Phillip Oconnor will reach a point where he is going to be able to participate in goals conversation, I did not change CODE STATUS at this point.  Length of Stay: 7  Current Medications: Scheduled Meds:   atorvastatin  80 mg Oral Daily   doxazosin  4 mg Oral Daily   heparin injection (subcutaneous)  5,000 Units Subcutaneous Q8H   insulin aspart  0-5 Units Subcutaneous QHS   insulin aspart  0-9 Units Subcutaneous TID WC   levETIRAcetam  500 mg Oral BID   mouth rinse  15 mL Mouth Rinse BID    Continuous Infusions:   PRN Meds: acetaminophen **OR** acetaminophen, magnesium hydroxide, ondansetron (ZOFRAN) IV, ondansetron **OR** ondansetron (ZOFRAN) IV, oxyCODONE-acetaminophen, traZODone  Physical Exam        General: Sleepy, confused HEENT: No bruits, no goiter, no JVD Heart: Regular rate and rhythm. No murmur appreciated. Lungs: Good air movement, clear Abdomen: Soft, nontender, nondistended, positive bowel sounds.   Ext: No significant edema Skin: Warm and dry Neuro: Does not follow commands    Vital Signs: BP 134/62 (BP Location: Left Arm)    Pulse 74   Temp 98.5 F (36.9 C) (Oral)   Resp 16   Ht 6' 4"  (1.93 m)   Wt 88.1 kg   SpO2 100%   BMI 23.64 kg/m  SpO2: SpO2: 100 % O2 Device: O2 Device: Room Air O2 Flow Rate:    Intake/output summary:  Intake/Output Summary (Last 24 hours) at 05/13/2021 0557 Last data filed at 05/13/2021 0424 Gross per 24 hour  Intake 759.7 ml  Output 825 ml  Net -65.3 ml   LBM: Last BM Date: 05/06/21 (per patient) Baseline Weight: Weight: 90.2 kg Most recent weight: Weight: 88.1 kg       Palliative Assessment/Data:    Flowsheet Rows    Flowsheet Row Most Recent Value  Intake Tab   Referral Department Hospitalist  Unit at Time of Referral Med/Surg Unit  Palliative Care Primary Diagnosis Cancer  Date Notified 05/11/21  Palliative Care Type New Palliative care  Reason for referral Clarify Goals of Care  Date of Admission 05/06/21  Date first seen by Palliative Care 05/11/21  # of days Palliative referral response time 0 Day(s)  # of days IP prior to Palliative referral 5  Clinical Assessment   Palliative Performance Scale Score 40%  Psychosocial & Spiritual Assessment   Palliative Care Outcomes   Patient/Family meeting held? Yes  Who was at the meeting? 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 male  with past medical history of  diabetes, hypertension, hyperlipidemia, stroke, seizure, chronic bilateral lower extremity edema with hernia surgery in June of this year that also had right psoas mass noted on CT scan in July.  He was recommended to follow-up with surgery but did not go to follow-up visits and was lost to follow-up.  He was admitted on 05/06/2021 with severe hip pain and was found to have significant growth of mass in psoas region now up to 10.2 cm.  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  He has become more lethargic/confused over the last couple of days and palliative consulted for goals of care  Recommendations/Plan: Currently full code/full scope This remains a difficult situation as Mr. Cossey still seems to be intermittently confused.  I met with his brother today who feels that if he were to truly understand his situation, he would desire to forego heroic interventions in the event of cardiac or respiratory arrest.  He also feels that he would likely decline aggressive work-up for what is likely a very aggressive sarcoma.  This seems consistent with what patient has been telling other providers (please see surgery note from today), but when I later stopped by to talk with patient alone, he tells me that he is not sure he agrees and needs time to think.  Despite him telling me this, I am still not convinced he has capacity to make decisions nor am I convinced he truly understands his situation. While I am going off service, I will ask another member of the palliative medicine team to check in with him tomorrow to see if he is more able to participate in conversation.  If, however, he still remains confused, I would defer to his brother as his surrogate decision-maker (while his brother reports he does have children, they  do not have contact information and he has not been in contact with them for decades).  In this case, plan would likely be for no heroic interventions in the event of cardiac or respiratory arrest and foregoing aggressive work-up for sarcoma in favor of going to rehab to see how much functional status he can regain prior to consideration of election of hospice benefits. In either case, next step will likely be to skilled facility for trial of rehab.  I would also recommend palliative care to follow as an outpatient.  Code Status:    Code Status Orders  (From admission, onward)           Start     Ordered   05/07/21 0134  Full code  Continuous        05/07/21 0135           Code Status History     Date Active Date Inactive Code Status Order ID Comments User Context   01/23/2021 0227 01/23/2021 2045 Full Code [id number]  Dwan Bolt, MD Inpatient   11/20/2020 0308 11/25/2020 2157 Full Code [id number]  Etta Quill, DO Inpatient       Prognosis: Guarded  Discharge Planning: [place] for rehab with Palliative care service follow-up  Care plan was discussed with patient, brother, bedside RN.  Thank you for  allowing the Palliative Medicine Team to assist in the care of this patient.   Total Time 60 minutes over 2 encounters Prolonged Time Billed  no       Greater than 50%  of this time was spent counseling and coordinating care related to the above assessment and plan.  Micheline Rough, MD  Please contact Palliative Medicine Team phone at [phone] for questions and concerns.

## 2021-05-13 NOTE — Progress Notes (Signed)
Daily Progress Note   Patient Name: Phillip Oconnor       Date: 05/13/2021 DOB: [birth year]  Age: 80 y.o. MRN#: [id number] Attending Physician: Terrilee Croak, MD Primary Care Physician: Marinda Elk Admit Date: 05/06/2021  Reason for Consultation/Follow-up: Establishing goals of care  Subjective: Patient is awake resting in bed. He asks for juice. I tried to re discuss broad goals of care, code status and next steps with him. I then placed a call and discussed with his brother Phillip Oconnor as well, see below.      Length of Stay: 7  Current Medications: Scheduled Meds:   atorvastatin  80 mg Oral Daily   doxazosin  4 mg Oral Daily   heparin injection (subcutaneous)  5,000 Units Subcutaneous Q8H   insulin aspart  0-5 Units Subcutaneous QHS   insulin aspart  0-9 Units Subcutaneous TID WC   levETIRAcetam  500 mg Oral BID   mouth rinse  15 mL Mouth Rinse BID    Continuous Infusions:   PRN Meds: acetaminophen **OR** acetaminophen, magnesium hydroxide, ondansetron (ZOFRAN) IV, ondansetron **OR** ondansetron (ZOFRAN) IV, oxyCODONE-acetaminophen, traZODone  Physical Exam        General: awake, alert.  Has generalized weakness.  HEENT: No bruits, no goiter, no JVD Heart: Regular rate and rhythm. No murmur appreciated. Lungs: Good air movement, clear Abdomen: Soft, nontender, nondistended, positive bowel sounds.   Ext: No significant edema Skin: Warm and dry Neuro: interacts some, but is very reserved, doesn't elaborate much.     Vital Signs: BP 134/62 (BP Location: Left Arm)   Pulse 74   Temp 98.5 F (36.9 C) (Oral)   Resp 16   Ht 6\' 4"  (1.93 m)   Wt 88.1 kg   SpO2 100%   BMI 23.64 kg/m  SpO2: SpO2: 100 % O2 Device: O2 Device: Room Air O2 Flow Rate:     Intake/output summary:  Intake/Output Summary (Last 24 hours) at 05/13/2021 0950 Last data filed at 05/13/2021 0424 Gross per 24 hour  Intake 538 ml  Output 825 ml  Net -287 ml    LBM: Last BM Date: 05/06/21 (per patient) Baseline Weight: Weight: 90.2 kg Most recent weight: Weight: 88.1 kg       Palliative Assessment/Data:    Administrator, Civil Service Row Most Recent  Value  Intake Tab   Referral Department Hospitalist  Unit at Time of Referral Med/Surg Unit  Palliative Care Primary Diagnosis Cancer  Date Notified 05/11/21  Palliative Care Type New Palliative care  Reason for referral Clarify Goals of Care  Date of Admission 05/06/21  Date first seen by Palliative Care 05/11/21  # of days Palliative referral response time 0 Day(s)  # of days IP prior to Palliative referral 5  Clinical Assessment   Palliative Performance Scale Score 40%  Psychosocial & Spiritual Assessment   Palliative Care Outcomes   Patient/Family meeting held? Yes  Who was at the meeting? 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 male  with past medical history of diabetes, hypertension, hyperlipidemia, stroke, seizure, chronic bilateral lower extremity edema with hernia surgery in June of this year that also had right psoas mass noted on CT scan in  July.  He was recommended to follow-up with surgery but did not go to follow-up visits and was lost to follow-up.  He was admitted on 05/06/2021 with severe hip pain and was found to have significant growth of mass in psoas region now up to 10.2 cm.  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  He has become more lethargic/confused over the last couple of days and palliative consulted for goals of care  Recommendations/Plan: Goals of care discussions held with the patient. While he is awake, reasonably alert this morning, I am not sure he has full capacity. He is very reserved and does not discuss much about what he understands about his condition, his goals and wishes.  Patient states, "it doesn't matter what I want. Life has been good to me." It appears implied in his brief conversations that he is leaning towards DNR DNI and avoiding aggressive treatments. He tells me that his brother Phillip Oconnor helps him make decisions, he has no spouse, he tells me he has no children. When I tried to continue the conversation, the patient simply told me, "this subject is closed."  I placed a call to the patient's brother Phillip Oconnor, who also concurs that the patient would elect for DNR DNI and avoiding aggressive tests procedures and interventions at this stage. Bruce asks me if we are sure that this is cancer. I reviewed with him, to the best of my ability about CT findings and the most likely suspicion being for a possible sarcoma. Bruce will come re discuss with the patient today and they will let us know about DNI and no further work up.   Recommend SNF rehab with palliative, discussed with Bruce about following the patient's  overall condition after rehab attempts and being prepared for addition of hospice services in the near  future.   PMT to follow.     Code Status:    Code Status Orders  (From admission, onward)           Start     Ordered   05/07/21 0134  Full code  Continuous        05/07/21 0135           Code Status History     Date Active Date Inactive Code Status Order ID Comments User Context   01/23/2021 0227 01/23/2021 2045 Full Code [id number]  Dwan Bolt, MD Inpatient   11/20/2020 0308 11/25/2020 2157 Full Code [id number]  Etta Quill, DO Inpatient       Prognosis: Guarded  Discharge Planning: [place] for rehab with Palliative care service follow-up  Care plan was discussed with patient, brother   Thank you for allowing the Palliative Medicine Team to assist in the care of this patient.   Total Time 40 Prolonged Time Billed  no       Greater than 50%  of this time was spent counseling and coordinating care related to the above assessment and plan.  Loistine Chance, MD  Please contact Palliative Medicine Team phone at [phone] for questions and concerns.

## 2021-05-14 DIAGNOSIS — R531 Weakness: Secondary | ICD-10-CM | POA: Diagnosis not present

## 2021-05-14 DIAGNOSIS — M6289 Other specified disorders of muscle: Secondary | ICD-10-CM | POA: Diagnosis not present

## 2021-05-14 DIAGNOSIS — Z515 Encounter for palliative care: Secondary | ICD-10-CM | POA: Diagnosis not present

## 2021-05-14 DIAGNOSIS — Z7189 Other specified counseling: Secondary | ICD-10-CM | POA: Diagnosis not present

## 2021-05-14 LAB — RESP PANEL BY RT-PCR (FLU A&B, COVID) ARPGX2
Influenza A by PCR: NEGATIVE
Influenza B by PCR: NEGATIVE
SARS Coronavirus 2 by RT PCR: NEGATIVE

## 2021-05-14 LAB — GLUCOSE, CAPILLARY
Glucose-Capillary: 184 mg/dL — ABNORMAL HIGH (ref 70–99)
Glucose-Capillary: 166 mg/dL — ABNORMAL HIGH (ref 70–99)

## 2021-05-14 MED ORDER — INSULIN ASPART 100 UNIT/ML IJ SOLN: 0.0000 [IU] | Freq: Every day | INTRAMUSCULAR | 11 refills | Status: AC

## 2021-05-14 MED ORDER — INSULIN ASPART 100 UNIT/ML IJ SOLN: 0.0000 [IU] | Freq: Three times a day (TID) | INTRAMUSCULAR | 11 refills | Status: AC

## 2021-05-14 MED ORDER — INSULIN GLARGINE-YFGN 100 UNIT/ML ~~LOC~~ SOLN: 8.0000 [IU] | Freq: Every day | SUBCUTANEOUS | Status: DC

## 2021-05-14 MED ORDER — INSULIN GLARGINE-YFGN 100 UNIT/ML ~~LOC~~ SOLN: 8.0000 [IU] | Freq: Every day | SUBCUTANEOUS | 11 refills | Status: AC

## 2021-05-14 MED ORDER — MAGNESIUM HYDROXIDE 400 MG/5ML PO SUSP: 30.0000 mL | Freq: Every day | ORAL | 0 refills | Status: AC | PRN

## 2021-05-14 NOTE — Plan of Care (Signed)
  Problem: Clinical Measurements: Goal: Cardiovascular complication will be avoided Outcome: Progressing   Problem: Activity: Goal: Risk for activity intolerance will decrease Outcome: Progressing   Problem: Nutrition: Goal: Adequate nutrition will be maintained Outcome: Progressing   Problem: Elimination: Goal: Will not experience complications related to bowel motility Outcome: Progressing Goal: Will not experience complications related to urinary retention Outcome: Progressing   Problem: Pain Managment: Goal: General experience of comfort will improve Outcome: Progressing

## 2021-05-14 NOTE — Progress Notes (Signed)
Daily Progress Note   Patient Name: Phillip Oconnor       Date: 05/14/2021 DOB: 04/24/1941  Age: 80 y.o. MRN#: [id number] Attending Physician: Terrilee Croak, MD Primary Care Physician: Marinda Elk Admit Date: 05/06/2021  Reason for Consultation/Follow-up: Establishing goals of care  Subjective: Patient is awake resting in bed. He is alert today, responds very briefly with 1 or 2 words to what ever questions I asked of him.  He again refuses to dive deeper into CODE STATUS/goals of care discussions, does not respond or verbalize, does not share with me whether or not his brother [name] visited with him yesterday or not.  Denies any complaints.     Length of Stay: 8  Current Medications: Scheduled Meds:   atorvastatin  80 mg Oral Daily   doxazosin  4 mg Oral Daily   heparin injection (subcutaneous)  5,000 Units Subcutaneous Q8H   insulin aspart  0-5 Units Subcutaneous QHS   insulin aspart  0-9 Units Subcutaneous TID WC   insulin glargine-yfgn  8 Units Subcutaneous QHS   levETIRAcetam  500 mg Oral BID   mouth rinse  15 mL Mouth Rinse BID    Continuous Infusions:   PRN Meds: acetaminophen **OR** acetaminophen, magnesium hydroxide, ondansetron (ZOFRAN) IV, ondansetron **OR** ondansetron (ZOFRAN) IV, oxyCODONE-acetaminophen, traZODone  Physical Exam        General: awake, alert.  Has generalized weakness.  HEENT: No bruits, no goiter, no JVD Heart: Regular rate and rhythm. No murmur appreciated. Lungs: Good air movement, clear Abdomen: Soft, nontender, nondistended, positive bowel sounds.   Ext: No significant edema Skin: Warm and dry Neuro: interacts some, but is very reserved, doesn't elaborate much.  Much more guarded today, does not verbalize much at  all.  Vital Signs: BP 123/67 (BP Location: Left Arm)   Pulse 74   Temp (!) 97.4 F (36.3 C) (Oral)   Resp 16   Ht 6\' 4"  (1.93 m)   Wt 88.1 kg   SpO2 100%   BMI 23.64 kg/m  SpO2: SpO2: 100 % O2 Device: O2 Device: Room Air O2 Flow Rate:    Intake/output summary:  Intake/Output Summary (Last 24 hours) at 05/14/2021 1034 Last data filed at 05/14/2021 0819 Gross per 24 hour  Intake 580 ml  Output 375 ml  Net 205 ml  LBM: Last BM Date: 05/06/21 (per patient) Baseline Weight: Weight: 90.2 kg Most recent weight: Weight: 88.1 kg       Palliative Assessment/Data:    Flowsheet Rows    Flowsheet Row Most Recent Value  Intake Tab   Referral Department Hospitalist  Unit at Time of Referral Med/Surg Unit  Palliative Care Primary Diagnosis Cancer  Date Notified 05/11/21  Palliative Care Type New Palliative care  Reason for referral Clarify Goals of Care  Date of Admission 05/06/21  Date first seen by Palliative Care 05/11/21  # of days Palliative referral response time 0 Day(s)  # of days IP prior to Palliative referral 5  Clinical Assessment   Palliative Performance Scale Score 40%  Psychosocial & Spiritual Assessment   Palliative Care Outcomes   Patient/Family meeting held? Yes  Who was at the meeting? 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 male  with past medical history of diabetes, hypertension, hyperlipidemia, stroke, seizure, chronic bilateral lower extremity edema with hernia surgery in June of this year that also had right psoas mass noted on CT scan in July.  He was recommended to follow-up with surgery but did not go to follow-up visits and was lost to follow-up.  He was admitted on 05/06/2021 with severe hip pain and was found to have significant growth of mass in psoas region now up to 10.2 cm.  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  He has become more lethargic/confused over the last couple of days and palliative consulted for goals of care  Recommendations/Plan: I reattempted CODE STATUS and goals of care discussions held with the patient. While he is awake, more alert this morning, I am still not sure he has full capacity. He is very reserved and does not discuss much about what he understands about his condition, his goals and wishes.  In our previous conversations with both the patient as well as his brother [name], the overall impression remains that the patient would not want aggressive work-up or treatments.  He knows he might be going to skilled nursing facility for rehab.  Other than that, does not wish to discuss any further.  No further recommendations from the palliative team.  Recommend SNF rehab with palliative, to continue CODE STATUS and goals of care discussions in the outpatient setting.  Code Status:    Code Status Orders  (From admission, onward)           Start     Ordered   05/07/21 0134  Full code  Continuous        05/07/21 0135  Code Status History     Date Active Date Inactive Code Status Order ID Comments User Context   01/23/2021 0227 01/23/2021 2045  Full Code [id number]  Dwan Bolt, MD Inpatient   11/20/2020 0308 11/25/2020 2157 Full Code [id number]  Etta Quill, DO Inpatient       Prognosis: Guarded  Discharge Planning: [place] for rehab with Palliative care service follow-up  Care plan was discussed with patient  Thank you for allowing the Palliative Medicine Team to assist in the care of this patient.   Total Time 25 Prolonged Time Billed  no       Greater than 50%  of this time was spent counseling and coordinating care related to the above assessment and plan.  Loistine Chance, MD  Please contact Palliative Medicine Team phone at [phone] for questions and concerns.

## 2021-05-14 NOTE — Discharge Summary (Signed)
Physician Discharge Summary  Phillip Oconnor QZE:[id number] DOB: 11-07-1940 DOA: 05/06/2021  PCP: Jenel Lucks, PA-C  Admit date: 05/06/2021 Discharge date: 05/14/2021  Admitted From: Home Discharge disposition: Nursing facility   Code Status: Full Code   Discharge Diagnosis:   Active Problems:   Mass of psoas muscle  Chief Complaint  Patient presents with   Hip Pain    Brief narrative: Phillip Oconnor is a 80 y.o. male with PMH significant for DM2, HTN, HLD, stroke, seizure, chronic bilateral lower extremity edema who lives alone at home. Patient presented to the ED on 9/20 with progressively worsening weakness of right lower extremity and right hip pain with difficulty ambulation for 3 to 4 days.  He noticed right hip pain the day before he had a hernia surgery on 01/23/2021.   Of note, CT scan of abdomen and pelvis done on 01/23/2019 had shown right psoas muscle mass.  Patient was recommended to follow-up with surgeon Dr. Zenia Resides as an outpatient which he missed.  In the ED, blood pressure was elevated 169/63 Labs with unremarkable CBC, serum bicarb low at 15, creatinine elevated to 2.17. CT abdomen pelvis showed 1. Soft tissue mass in the right inferior psoas region has significantly increased in size measuring up to 10.2 cm when compared to CT scan from 3 months ago. 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 2. New small volume ascites, diffuse body wall edema and small bilateral pleural effusions.  Patient was admitted to hospitalist service for further evaluation management. See below for details   Because of his renal failure, were not able to obtain contrast imaging that have helped to know more about the mass.  He is too weak and confused at this time to be stable for any procedure.  Palliative care consultation was obtained.  Patient does not want to go for any aggressive measures at this time. Plan is to discharge him to SNF with palliative care services.  If his  functional status improves and he would like to pursue further investigation but the mass, he can follow-up with general surgery at a tertiary care center as an outpatient.  Subjective: Patient was seen and examined this morning.   Alert, awake, slow to respond.  Not oriented to time place or person.  Not restless or agitated.  Assessment/Plan: Right inferior psoas muscle mass  -I discussed the case with oncologist Dr. Earlie Server and surgeon Dr. Zenia Resides.  Core biopsy was not recommended because of the possibility of it being a sarcoma.  Noncontrast CT scan and MRI were obtained but the mass could not be characterized properly without a contrast study. -Discussed with surgery team.  Without contrast imaging, unable to tell if this tumor is resectable.  Even if it is resectable, he has to be at a tertiary/academic center for the surgery. -Plan was to hydrate him to bring the creatinine down and get a contrast study.  We hydrated the patient for 72 hours despite which his creatinine did not significantly improve.  His mental status rather got worse.  At this time is not able to have a longer meaningful conversation regarding cancer and the treatment plan.  But he has expressed his wish not to carry on aggressive care at this time.  Acute encephalopathy -Patient's mental status has been gradually worsening since hospital stay.  Multifactorial: Hospital stay, uremia, pain medicines. -He has mostly frail, sleepy, not restless or agitated and is not requiring sedatives.  AKI on CKD 3a -Baseline creatinine less than 1.5.  Presented with a creatinine of 2.17. -It failed to significantly improve despite hydration.  Hydration could not be continued because he also has coexisting pedal edema -Encourage oral hydration  Uncontrolled type 2 diabetes mellitus -A1c was 9.6 on April 2022.  It seems to have improved to 6.8 on repeat test this admission.  Probably due to poor appetite. -Home meds include glipizide 10  mg twice daily, metformin 500 mg twice daily. -Both of the are currently on hold. -Currently he is on Lantus 8 units daily.  Continue the same, continue continue sliding scale insulin with Accu-Cheks. Recent Labs  Lab 05/13/21 1116 05/13/21 1611 05/13/21 2005 05/14/21 0724 05/14/21 1112  GLUCAP 202* 212* 172* 184* 166*   Essential hypertension -Home meds include Coreg 3.125 mg twice daily, doxazosin 4 mg daily.   -Coreg stopped because of bradycardia.  We will continue doxazosin.  Chronic lower extremity edema -Patient has pitting edema bilaterally.  Probably worsened in last several days because of immobility.  May likely worsen with immobility but also has poor oral intake.  If his intake improves, may consider diuretics down the road.  Dyslipidemia History of stroke -Continue aspirin 325 daily, Lipitor 80 mg daily  History of seizure disorder -Continue Keppra 500 mg twice daily   Allergies as of 05/14/2021       Reactions   Penicillins Rash, Swelling   Lips swell   Insulin Lispro Itching   Plavix [clopidogrel] Palpitations        Medication List     STOP taking these medications    apixaban 5 MG Tabs tablet Commonly known as: ELIQUIS   blood glucose meter kit and supplies   carvedilol 3.125 MG tablet Commonly known as: COREG   cyanocobalamin 1000 MCG/ML injection Commonly known as: (VITAMIN B-12)   glipiZIDE 10 MG tablet Commonly known as: GLUCOTROL   hydrALAZINE 50 MG tablet Commonly known as: APRESOLINE   insulin aspart 100 UNIT/ML FlexPen Commonly known as: NOVOLOG Replaced by: insulin aspart 100 UNIT/ML injection   insulin glargine 100 UNIT/ML Solostar Pen Commonly known as: LANTUS   linagliptin 5 MG Tabs tablet Commonly known as: TRADJENTA   metFORMIN 500 MG tablet Commonly known as: GLUCOPHAGE   NIFEdipine 90 MG 24 hr tablet Commonly known as: PROCARDIA XL/NIFEDICAL-XL   PenTips 32G X 4 MM Misc Generic drug: Insulin Pen Needle        TAKE these medications    Accu-Chek Softclix Lancets lancets Use as directed up to 6 times daily.   aspirin 325 MG tablet Take 325 mg by mouth daily.   atorvastatin 80 MG tablet Commonly known as: LIPITOR Take 1 tablet (80 mg total) by mouth daily.   doxazosin 4 MG tablet Commonly known as: CARDURA Take 4 mg by mouth daily.   glucose blood test strip Use as instructed   insulin aspart 100 UNIT/ML injection Commonly known as: novoLOG Inject 0-9 Units into the skin 3 (three) times daily with meals. Replaces: insulin aspart 100 UNIT/ML FlexPen   insulin aspart 100 UNIT/ML injection Commonly known as: novoLOG Inject 0-5 Units into the skin at bedtime.   insulin glargine-yfgn 100 UNIT/ML injection Commonly known as: SEMGLEE Inject 0.08 mLs (8 Units total) into the skin at bedtime.   levETIRAcetam 500 MG tablet Commonly known as: KEPPRA Take 1 tablet (500 mg total) by mouth 2 (two) times daily.   magnesium hydroxide 400 MG/5ML suspension Commonly known as: MILK OF MAGNESIA Take 30 mLs by mouth daily as needed for mild constipation.  Discharge Instructions:  Diet Recommendation: Regular diet   Follow with Primary MD Jenel Lucks, PA-C in 7 days   Get CBC/BMP checked in next visit within 1 week by PCP or SNF MD ( we routinely change or add medications that can affect your baseline labs and fluid status, therefore we recommend that you get the mentioned basic workup next visit with your PCP, your PCP may decide not to get them or add new tests based on their clinical decision)  On your next visit with your PCP, please Get Medicines reviewed and adjusted.  Please request your PCP  to go over all Hospital Tests and Procedure/Radiological results at the follow up, please get all Hospital records sent to your Prim MD by signing hospital release before you go home.  Activity: As tolerated with Full fall precautions use walker/cane & assistance as  needed  For Heart failure patients - Check your Weight same time everyday, if you gain over 2 pounds, or you develop in leg swelling, experience more shortness of breath or chest pain, call your Primary MD immediately. Follow Cardiac Low Salt Diet and 1.5 lit/day fluid restriction.  If you have smoked or chewed Tobacco in the last 2 yrs please stop smoking, stop any regular Alcohol  and or any Recreational drug use.  If you experience worsening of your admission symptoms, develop shortness of breath, life threatening emergency, suicidal or homicidal thoughts you must seek medical attention immediately by calling 911 or calling your MD immediately  if symptoms less severe.  You Must read complete instructions/literature along with all the possible adverse reactions/side effects for all the Medicines you take and that have been prescribed to you. Take any new Medicines after you have completely understood and accpet all the possible adverse reactions/side effects.   Do not drive, operate heavy machinery, perform activities at heights, swimming or participation in water activities or provide baby sitting services if your were admitted for syncope or siezures until you have seen by Primary MD or a Neurologist and advised to do so again.  Do not drive when taking Pain medications.  Do not take more than prescribed Pain, Sleep and Anxiety Medications  Wear Seat belts while driving.   Please note You were cared for by a hospitalist during your hospital stay. If you have any questions about your discharge medications or the care you received while you were in the hospital after you are discharged, you can call the unit and asked to speak with the hospitalist on call if the hospitalist that took care of you is not available. Once you are discharged, your primary care physician will handle any further medical issues. Please note that NO REFILLS for any discharge medications will be authorized once you are  discharged, as it is imperative that you return to your primary care physician (or establish a relationship with a primary care physician if you do not have one) for your aftercare needs so that they can reassess your need for medications and monitor your lab values.    Follow ups:    Contact information for follow-up providers     Jenel Lucks, PA-C Follow up.   Specialty: Internal Medicine Contact information: [place] [address] [phone]              Contact information for after-discharge care     Destination     [facility] .   Service: Skilled Chiropodist information: [place] [phone]                     Wound care:   Incision (Closed) 01/23/21 Groin (Active)  Date First Assessed/Time First Assessed: 01/23/21 0047   Location: Groin    Assessments 01/23/2021  1:45 AM 01/23/2021  4:00 AM  Dressing Type Liquid skin adhesive Liquid skin adhesive  Dressing Dry;Clean;Intact Clean;Dry;Intact  Site / Wound Assessment -- Clean;Dry  Margins Attached edges (approximated) Attached edges (approximated)  Closure Skin glue Skin glue  Drainage Amount None None     No Linked orders to display    Discharge Exam:   Vitals:   05/13/21 1334 05/13/21 2002 05/14/21 0513 05/14/21 0853  BP: (!) 134/58 (!) 110/95 137/75 123/67  Pulse: 71 75 75 74  Resp: (!) 22 18 15 16   Temp: 97.7 F (36.5 C) 97.7 F (36.5 C) 98.2 F (36.8 C) (!) 97.4 F (36.3 C)  TempSrc: Oral Oral Oral Oral  SpO2: 100% 94% 100% 100%  Weight:      Height:        Body mass index is 23.64 kg/m.  General exam: Lethargic.  Not in distress Skin: No rashes, lesions or ulcers. HEENT: Atraumatic, normocephalic, no obvious bleeding Lungs: Clear to auscultation bilaterally CVS: Regular rate and rhythm, no murmur GI/Abd soft, nontender, nondistended, bowel sound present CNS: Alert, awake, remains  disoriented, not restless or agitated Psychiatry: Sad affect Extremities: 1+ bilateral pedal edema.  Currently has compression stockings on.  Time coordinating discharge: 35 minutes   The results of significant diagnostics from this hospitalization (including imaging, microbiology, ancillary and laboratory) are listed below for reference.    Procedures and Diagnostic Studies:   CT ABDOMEN PELVIS WO CONTRAST  Result Date: 05/06/2021 CLINICAL DATA:  Abdominal distension.  Right hip pain. EXAM: CT ABDOMEN AND PELVIS WITHOUT CONTRAST TECHNIQUE: Multidetector CT imaging of the abdomen and pelvis was performed following the standard protocol without IV contrast. COMPARISON:  CT abdomen and pelvis 01/22/2021. FINDINGS: Lower chest: There are new small bilateral pleural effusions. There is atelectasis in the bilateral lung bases. Hepatobiliary: Exophytic lesion from the posteromedial right lobe of the liver measuring 14 mm is unchanged. No definite new liver lesions are identified. There is hyperdensity within the gallbladder which may represent gallbladder sludge. There is no biliary ductal dilatation. Pancreas: Unremarkable. No pancreatic ductal dilatation or surrounding inflammatory changes. Spleen: Normal in size without focal abnormality. Adrenals/Urinary Tract: The bilateral adrenal glands are within normal limits. There is no hydronephrosis no urinary tract calculi are seen. There is a left renal cyst measuring 2 cm which is unchanged. There is a stable right renal cyst measuring 2.6 cm. Bilateral perinephric fat stranding is unchanged. Focal calcifications in the right Peri renal fat are stable from the prior examination. The bladder is within normal limits. Stomach/Bowel: Stomach is within normal limits. Appendix appears normal. No evidence of bowel wall thickening, distention, or inflammatory changes. Vascular/Lymphatic: There are atherosclerotic calcifications of the aorta. The aorta is normal in  size. No discrete enlarged lymph nodes are identified. Reproductive: Prostate is unremarkable. Other: Again seen is heterogeneous soft tissue mass containing some calcifications in the right inferior psoas region. The mass has significantly increased in size now measuring 9.2 by 7.9 by 10.2 cm. There is mild surrounding inflammatory stranding, unchanged. There is new small volume ascites with new diffuse haziness of mesentery. There is no free intraperitoneal air. No significant hernia. There is a mesh along  the anterior inferior abdominal wall. There is also new diffuse body wall edema. Musculoskeletal: Multilevel degenerative changes affect the spine. IMPRESSION: 1. Soft tissue mass in the right inferior psoas region has significantly increased in size now measuring up to 10.2 cm. 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 2. New small volume ascites. 3. New diffuse body wall edema. 4. New small bilateral pleural effusions. 5. Unchanged exophytic lesion from the right lobe of the liver, indeterminate. This can be further characterized with MRI. 6. Gallbladder sludge. 7. Stable nonspecific bilateral perinephric fat stranding. Correlate for medical renal disease or infection. 8. Aortic Atherosclerosis (ICD10-I70.0). Electronically Signed   By: Ronney Asters M.D.   On: 05/06/2021 20:09   CT HEAD WO CONTRAST (5MM)  Result Date: 05/06/2021 CLINICAL DATA:  Mental status change of unknown cause. Unable to ambulate for 4 days. Recent hernia surgery. EXAM: CT HEAD WITHOUT CONTRAST TECHNIQUE: Contiguous axial images were obtained from the base of the skull through the vertex without intravenous contrast. COMPARISON:  11/19/2020 FINDINGS: Brain: Large area of encephalomalacia in the right frontal lobe consistent with old infarct. This is unchanged since prior study. No mass effect or midline shift. No abnormal extra-axial fluid collections. No significant ventricular dilatation. Basal cisterns are not effaced. No acute  intracranial hemorrhage. Vascular: Moderate intracranial arterial vascular calcifications. Skull: Calvarium appears intact. Sinuses/Orbits: Mucosal thickening in the paranasal sinuses. No acute air-fluid levels. Mastoid air cells are clear. Other: None. IMPRESSION: No acute intracranial abnormalities. 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 Electronically Signed   By: Lucienne Capers M.D.   On: 05/06/2021 20:00   MR PELVIS WO CONTRAST  Result Date: 05/08/2021 CLINICAL DATA:  Right lower quadrant mass EXAM: MRI ABDOMEN AND PELVIS WITHOUT CONTRAST TECHNIQUE: Multiplanar multisequence MR imaging of the abdomen and pelvis was performed. No intravenous contrast was administered. COMPARISON:  CT from 05/06/2021 FINDINGS: COMBINED FINDINGS FOR BOTH MR ABDOMEN AND PELVIS Lower chest: Not visualized Hepatobiliary: Not visualized Pancreas:  Not visualized Spleen:  Not visualized Adrenals/Urinary Tract: Nonvisualized. Inferior pole of both kidneys are included within this exam. No hydronephrosis identified. Several small cysts noted within the mid and inferior pole of the scratch set several small cysts are partially visualized. There is a small polypoid filling defect within the right inferior bladder wall measuring 3 mm, image 15/2. Stomach/Bowel: No dilated bowel loops. The visualized bowel loops are normal in caliber. No signs of bowel wall thickening. Vascular/Lymphatic: Aortic atherosclerosis. No abdominopelvic adenopathy. Reproductive: Prostate gland enlargement. Other: Again seen is a large, heterogeneous mass containing calcifications involving the inferior right iliopsoas muscle. This measures 11.4 by 8.2 by 8.9 cm. There is heterogeneous increased T2 signal and fairly homogeneous T1 isointense signal. This is incompletely characterized without IV contrast material. Although similar in size to the study from 05/06/2021 there has been significant interval increase in  size of mass compared with 01/22/2021. Small volume of free fluid noted within the dependent pelvis. Diffuse soft tissue edema is identified compatible with anasarca. Musculoskeletal: Heterogeneous appearance of the bone marrow is nonspecific. IMPRESSION: 1. Large heterogeneous mass containing calcifications involving the inferior right iliopsoas muscle is similar in size to the study from 05/06/2021. This is incompletely characterized without IV contrast material. Differential considerations include benign and malignant neoplasm. Alternatively, a chronic hematoma may have a similar appearance. In the absence of signs or symptoms of infection, abscess is considered less favored. More definitive characterization with contrast enhanced cross-sectional imaging is recommended to assess the enhancement characteristics  of this indeterminate mass. In a patient who may have difficulty with breath hold technique and/or remaining motionless, a CT of the pelvis without and with contrast material is recommended. 2. Small polypoid filling defect within the right inferior bladder wall measuring 3 mm. This may represent a small polyp or mass. 3. Small volume of free fluid within the dependent pelvis. 4. Diffuse soft tissue edema compatible with anasarca. Electronically Signed   By: Kerby Moors M.D.   On: 05/08/2021 08:19   MR ABDOMEN WO CONTRAST  Result Date: 05/08/2021 CLINICAL DATA:  Right lower quadrant mass EXAM: MRI ABDOMEN AND PELVIS WITHOUT CONTRAST TECHNIQUE: Multiplanar multisequence MR imaging of the abdomen and pelvis was performed. No intravenous contrast was administered. COMPARISON:  CT from 05/06/2021 FINDINGS: COMBINED FINDINGS FOR BOTH MR ABDOMEN AND PELVIS Lower chest: Not visualized Hepatobiliary: Not visualized Pancreas:  Not visualized Spleen:  Not visualized Adrenals/Urinary Tract: Nonvisualized. Inferior pole of both kidneys are included within this exam. No hydronephrosis identified. Several small  cysts noted within the mid and inferior pole of the scratch set several small cysts are partially visualized. There is a small polypoid filling defect within the right inferior bladder wall measuring 3 mm, image 15/2. Stomach/Bowel: No dilated bowel loops. The visualized bowel loops are normal in caliber. No signs of bowel wall thickening. Vascular/Lymphatic: Aortic atherosclerosis. No abdominopelvic adenopathy. Reproductive: Prostate gland enlargement. Other: Again seen is a large, heterogeneous mass containing calcifications involving the inferior right iliopsoas muscle. This measures 11.4 by 8.2 by 8.9 cm. There is heterogeneous increased T2 signal and fairly homogeneous T1 isointense signal. This is incompletely characterized without IV contrast material. Although similar in size to the study from 05/06/2021 there has been significant interval increase in size of mass compared with 01/22/2021. Small volume of free fluid noted within the dependent pelvis. Diffuse soft tissue edema is identified compatible with anasarca. Musculoskeletal: Heterogeneous appearance of the bone marrow is nonspecific. IMPRESSION: 1. Large heterogeneous mass containing calcifications involving the inferior right iliopsoas muscle is similar in size to the study from 05/06/2021. This is incompletely characterized without IV contrast material. Differential considerations include benign and malignant neoplasm. Alternatively, a chronic hematoma may have a similar appearance. In the absence of signs or symptoms of infection, abscess is considered less favored. More definitive characterization with contrast enhanced cross-sectional imaging is recommended to assess the enhancement characteristics of this indeterminate mass. In a patient who may have difficulty with breath hold technique and/or remaining motionless, a CT of the pelvis without and with contrast material is recommended. 2. Small polypoid filling defect within the right inferior  bladder wall measuring 3 mm. This may represent a small polyp or mass. 3. Small volume of free fluid within the dependent pelvis. 4. Diffuse soft tissue edema compatible with anasarca. Electronically Signed   By: Kerby Moors M.D.   On: 05/08/2021 08:19   DG Chest Port 1 View  Result Date: 05/06/2021 CLINICAL DATA:  Weakness EXAM: PORTABLE CHEST 1 VIEW COMPARISON:  11/19/2020 FINDINGS: The heart size and mediastinal contours are within normal limits. No focal pulmonary opacity. Previously noted small pleural effusions are no longer seen. No acute osseous abnormality. IMPRESSION: No active disease. Electronically Signed   By: Merilyn Baba M.D.   On: 05/06/2021 19:22     Labs:   Basic Metabolic Panel: Recent Labs  Lab 05/09/21 0421 05/10/21 0805 05/11/21 0443 05/12/21 0409 05/13/21 0505  NA 139 139 138 141 144  K 4.4 4.5 4.2 4.1 4.3  CL 111 116* 113* 118* 120*  CO2 15* 15* 15* 14* 16*  GLUCOSE 155* 187* 234* 229* 237*  BUN 48* 53* 56* 51* 53*  CREATININE 1.89* 2.01* 2.03* 2.02* 1.71*  CALCIUM 8.6* 8.4* 8.4* 8.4* 8.7*   GFR Estimated Creatinine Clearance: 40.9 mL/min (A) (by C-G formula based on SCr of 1.71 mg/dL (H)). Liver Function Tests: No results for input(s): AST, ALT, ALKPHOS, BILITOT, PROT, ALBUMIN in the last 168 hours. No results for input(s): LIPASE, AMYLASE in the last 168 hours. No results for input(s): AMMONIA in the last 168 hours. Coagulation profile No results for input(s): INR, PROTIME in the last 168 hours.  CBC: Recent Labs  Lab 05/09/21 0421 05/10/21 0805 05/11/21 0443 05/12/21 0409 05/13/21 0505  WBC 8.0 7.2 6.9 6.8 7.6  NEUTROABS 6.2 5.9 5.7 5.6 5.9  HGB 12.0* 11.2* 10.9* 10.3* 10.5*  HCT 37.2* 35.6* 34.1* 32.6* 33.0*  MCV 92.8 93.0 93.7 92.1 93.8  PLT 156 144* 128* 105* 99*   Cardiac Enzymes: No results for input(s): CKTOTAL, CKMB, CKMBINDEX, TROPONINI in the last 168 hours. BNP: Invalid input(s): POCBNP CBG: Recent Labs  Lab  05/13/21 1116 05/13/21 1611 05/13/21 2005 05/14/21 0724 05/14/21 1112  GLUCAP 202* 212* 172* 184* 166*   D-Dimer No results for input(s): DDIMER in the last 72 hours. Hgb A1c No results for input(s): HGBA1C in the last 72 hours. Lipid Profile No results for input(s): CHOL, HDL, LDLCALC, TRIG, CHOLHDL, LDLDIRECT in the last 72 hours. Thyroid function studies No results for input(s): TSH, T4TOTAL, T3FREE, THYROIDAB in the last 72 hours.  Invalid input(s): FREET3 Anemia work up No results for input(s): VITAMINB12, FOLATE, FERRITIN, TIBC, IRON, RETICCTPCT in the last 72 hours. Microbiology Recent Results (from the past 240 hour(s))  Resp Panel by RT-PCR (Flu A&B, Covid) Nasopharyngeal Swab     Status: None   Collection Time: 05/06/21  6:17 PM   Specimen: Nasopharyngeal Swab; Nasopharyngeal(NP) swabs in vial transport medium  Result Value Ref Range Status   SARS Coronavirus 2 by RT PCR NEGATIVE NEGATIVE Final    Comment: (NOTE) SARS-CoV-2 target nucleic acids are NOT DETECTED.  The SARS-CoV-2 RNA is generally detectable in upper respiratory specimens during the acute phase of infection. The lowest concentration of SARS-CoV-2 viral copies this assay can detect is 138 copies/mL. A negative result does not preclude SARS-Cov-2 infection and should not be used as the sole basis for treatment or other patient management decisions. A negative result may occur with  improper specimen collection/handling, submission of specimen other than nasopharyngeal swab, presence of viral mutation(s) within the areas targeted by this assay, and inadequate number of viral copies(<138 copies/mL). A negative result must be combined with clinical observations, patient history, and epidemiological information. The expected result is Negative.  Fact Sheet for Patients:  EntrepreneurPulse.com.au  Fact Sheet for Healthcare Providers:  IncredibleEmployment.be  This  test is no t yet approved or cleared by the [place] FDA and  has been authorized for detection and/or diagnosis of SARS-CoV-2 by FDA under an Emergency Use Authorization (EUA). This EUA will remain  in effect (meaning this test can be used) for the duration of the COVID-19 declaration under Section 564(b)(1) of the Act, 21 U.S.C.section 360bbb-3(b)(1), unless the authorization is terminated  or revoked sooner.       Influenza A by PCR NEGATIVE NEGATIVE Final   Influenza B by PCR NEGATIVE NEGATIVE Final    Comment: (NOTE) The Xpert Xpress SARS-CoV-2/FLU/RSV plus assay is intended as an  aid in the diagnosis of influenza from Nasopharyngeal swab specimens and should not be used as a sole basis for treatment. Nasal washings and aspirates are unacceptable for Xpert Xpress SARS-CoV-2/FLU/RSV testing.  Fact Sheet for Patients: EntrepreneurPulse.com.au  Fact Sheet for Healthcare Providers: IncredibleEmployment.be  This test is not yet approved or cleared by the [place] FDA and has been authorized for detection and/or diagnosis of SARS-CoV-2 by FDA under an Emergency Use Authorization (EUA). This EUA will remain in effect (meaning this test can be used) for the duration of the COVID-19 declaration under Section 564(b)(1) of the Act, 21 U.S.C. section 360bbb-3(b)(1), unless the authorization is terminated or revoked.  Performed at [facility], [address] [place], [place] [address]      Signed: Terrilee Croak  [facility] 05/14/2021, 11:57 AM

## 2021-05-14 NOTE — TOC Transition Note (Addendum)
Transition of Care [facility]) - CM/SW Discharge Note   Patient Details  Name: DEVARIOUS PAVEK MRN: [id number] Date of Birth: [birth year]  [facility] CM/SW Contact:  Dessa Phi, RN Phone Number: 05/14/2021, 12:57 PM   Clinical Narrative: d/c summary sent to [facility]-rep Kim aware. Awaiting covid results prior rm#,tel# for nsg report then [facility].  1:52p-covid results back. [facility] rep Maudie Mercury aware of acceptance-going to rm#[phone],nsg call report tel#[phone]. PT[facility] called. No further CM needs.     Final next level of care: Skilled Nursing Facility Barriers to Discharge: No Barriers Identified   Patient Goals and CMS Choice Patient states their goals for this hospitalization and ongoing recovery are:: go home CMS Medicare.gov Compare Post Acute Care list provided to:: Patient Represenative (must comment) [name] brother [phone])    Discharge Placement                       Discharge Plan and Services   Discharge Planning Services: CM Consult Post Acute Care Choice: [facility]                               Social Determinants of Health (SDOH) Interventions     Readmission Risk Interventions No flowsheet data found.

## 2021-05-14 NOTE — Progress Notes (Signed)
Nutrition Brief Note  Patient identified on the Malnutrition Screening Tool (MST) Report  Wt Readings from Last 15 Encounters:  05/06/21 88.1 kg  11/25/20 93.4 kg    Body mass index is 23.64 kg/m. Patient meets criteria for normal weight based on current BMI.   Patient was admitted on 9/21 and was ordered Heart Healthy/Carb Modified diet at that time. Patient has been eating 0-10% over the past 4 days.   Able to talk with patient and his brother, who was at bedside. Patient and brother report that at baseline/at home patient has a good appetite and eats well. He mainly cooks and his favorite meal is breakfast.   Talked with them about requesting diet liberalization to Regular so patient has more food options and brother (or other visitors) can bring in favorite foods for patient. When asked if he thought this would be helpful, patient said "very!"  Able to talk with MD via secure chat who was very agreeable to diet liberalization.  Labs and medications reviewed.   Discharge order and discharge summary now entered for patient from home and discharging to SNF.   No further nutrition interventions warranted at this time. If nutrition issues arise, please consult RD.      Jarome Matin, MS, RD, LDN, CNSC Inpatient Clinical Dietitian RD pager # available in [place]  After hours/weekend pager # available in [facility]

## 2021-06-17 DEATH — deceased

## 2022-05-24 IMAGING — MR MR ABDOMEN W/O CM
9 series · 47 of 48 positions shown · non-contrast
Comparison: CT from 05/06/2021

CLINICAL DATA: Right lower quadrant mass

EXAM:
MRI ABDOMEN AND PELVIS WITHOUT CONTRAST
TECHNIQUE: Multiplanar multisequence MR imaging of the abdomen and pelvis was
performed. No intravenous contrast was administered.

[Series 3: T2 · coronal · 6.0mm · 1.56mm/px · 3 of 35 slices shown]
[im 1/35]
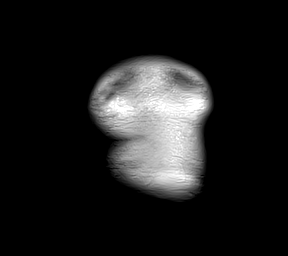
[im 18/35]
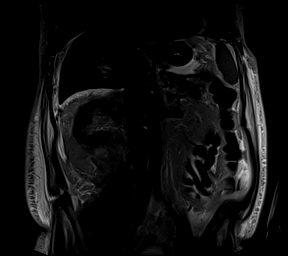
[im 35/35]
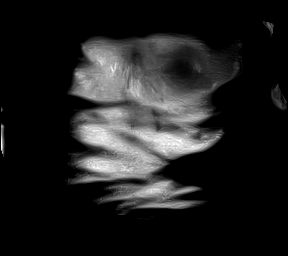

[Series 5: T2 fat-sat · 1 of 6 slices shown (1 of 2)]
[im 1/6]
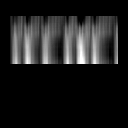

[Series 6: T2 fat-sat · axial · 6.0mm · 1.25mm/px · z∈[+35,+287]mm · 3 of 36 slices shown (2 of 2)]
[im 1/36]
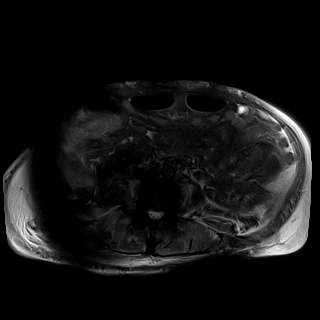
[im 18/36]
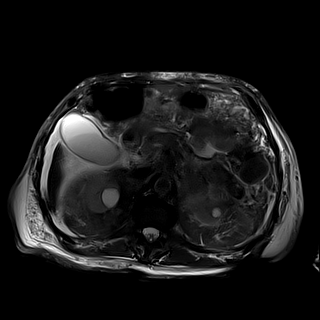
[im 36/36]
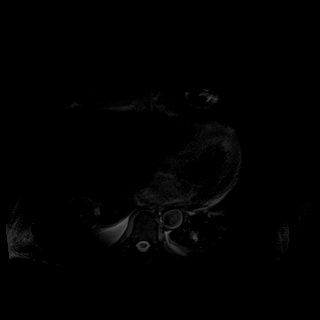

[Series 8: T1 · axial · 3.0mm · 1.25mm/px · z∈[+44,+305]mm · 9 of 88 slices shown (1 of 2)]
[im 1/88]
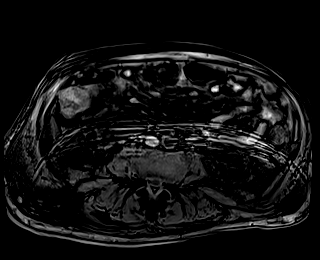
[im 11/88]
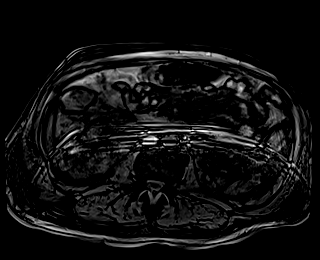
[im 22/88]
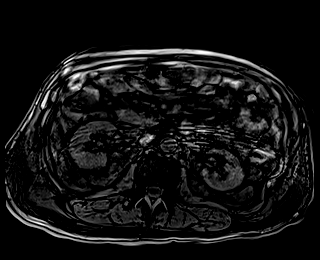
[im 33/88]
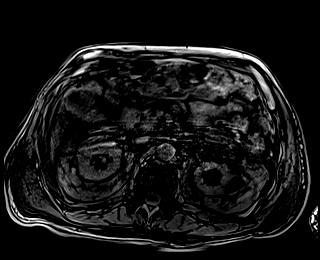
[im 44/88]
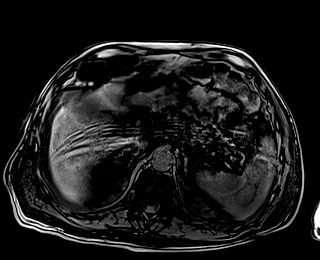
[im 55/88]
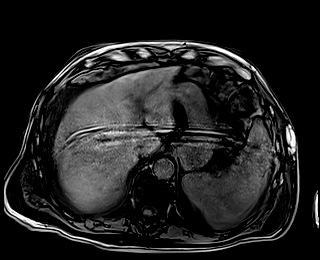
[im 66/88]
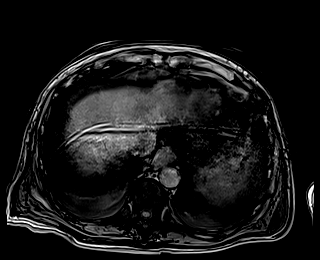
[im 77/88]
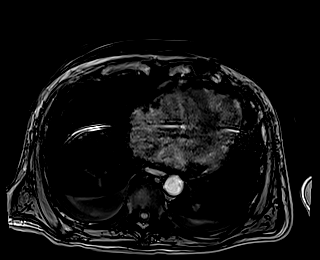
[im 88/88]
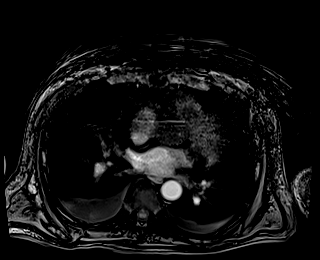

[Series 9: T1 · axial · 3.0mm · 1.25mm/px · z∈[+44,+305]mm · 8 of 88 slices shown (2 of 2)]
[im 1/88]
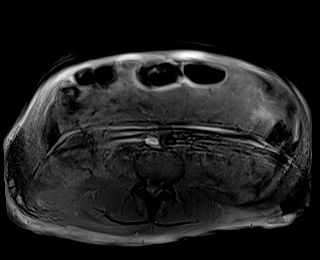
[im 11/88]
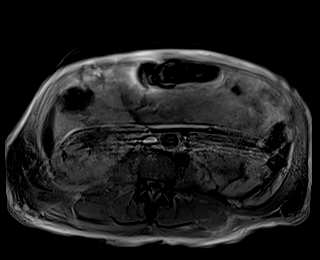
[im 22/88]
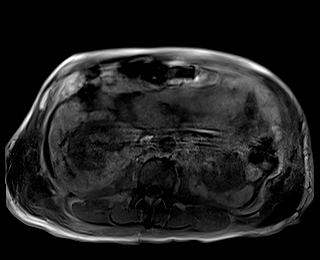
[im 33/88]
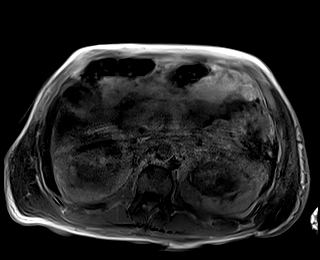
[im 55/88]
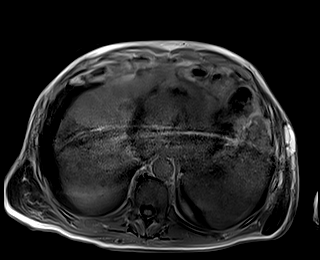
[im 66/88]
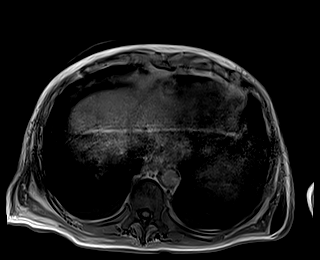
[im 77/88]
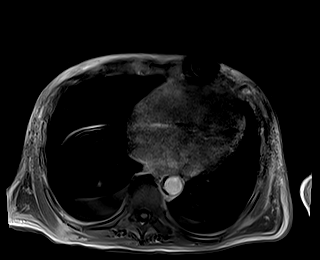
[im 88/88]
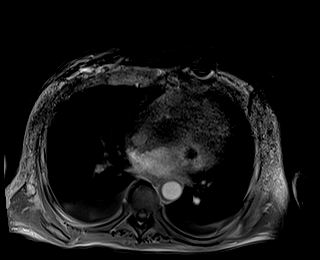

[Series 10: DWI · axial · 6.0mm · 1.49mm/px · z∈[+48,+300]mm · 7 of 71 slices shown (1 of 2)]
[im 1/71]
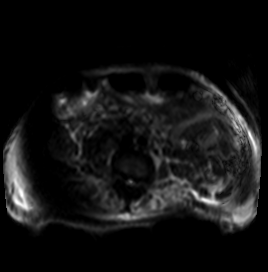
[im 12/71]
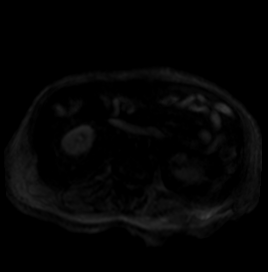
[im 24/71]
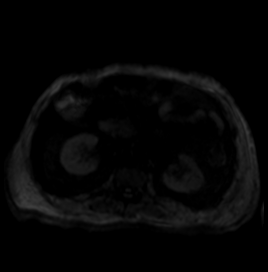
[im 36/71]
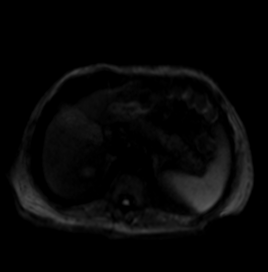
[im 47/71]
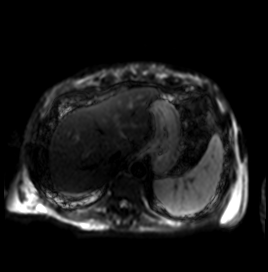
[im 59/71]
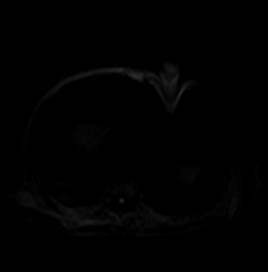
[im 71/71]
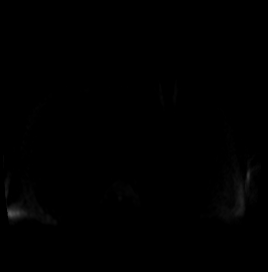

[Series 11: DWI · axial · 6.0mm · 1.49mm/px · z∈[+48,+300]mm · 3 of 36 slices shown (2 of 2)]
[im 1/36]
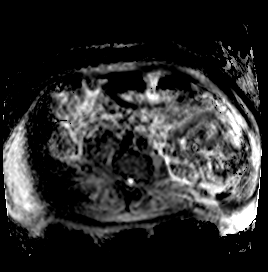
[im 18/36]
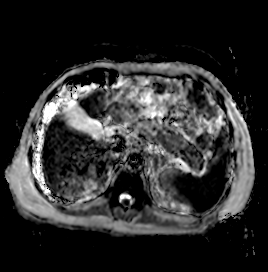
[im 36/36]
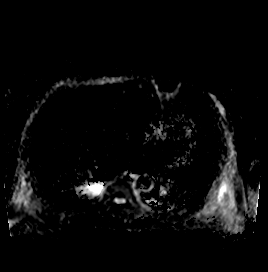

[Series 12: bSSFP · axial · 4.0mm · 0.84mm/px · z∈[+62,+278]mm · 5 of 55 slices shown]
[im 1/55]
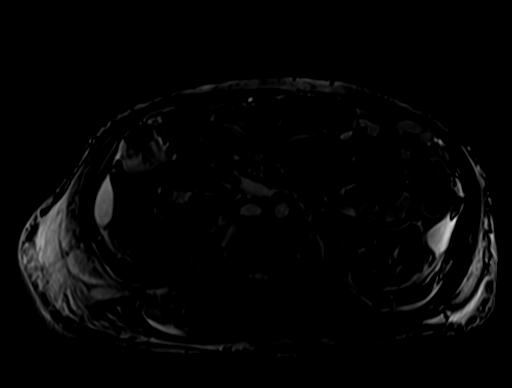
[im 14/55]
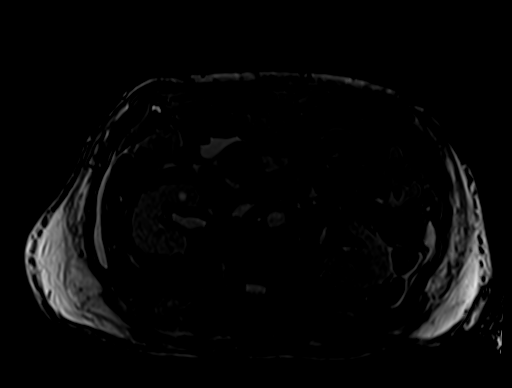
[im 28/55]
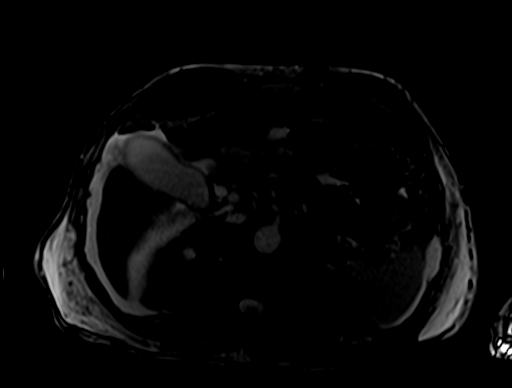
[im 41/55]
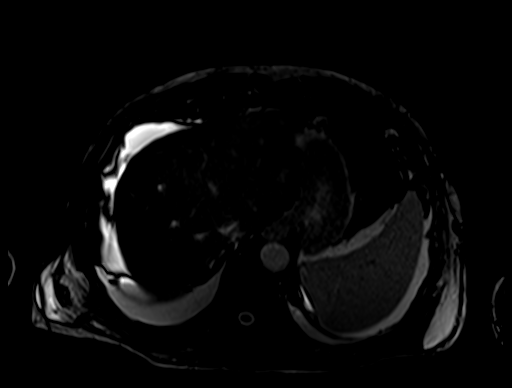
[im 55/55]
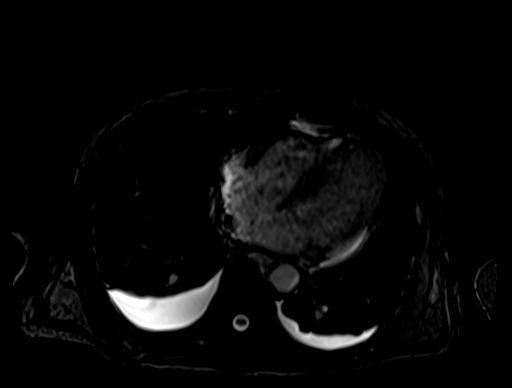

[Series 14: T1 dynamic · axial · 3.0mm · 1.25mm/px · z∈[+43,+280]mm · 8 of 80 slices shown]
[im 1/80]
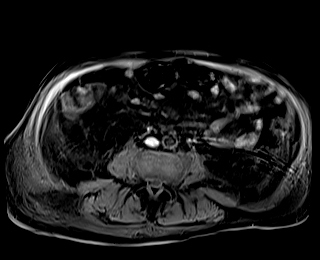
[im 12/80]
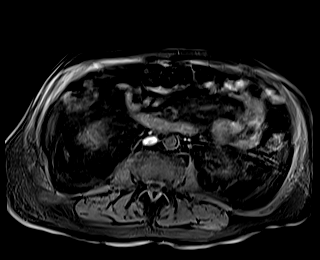
[im 23/80]
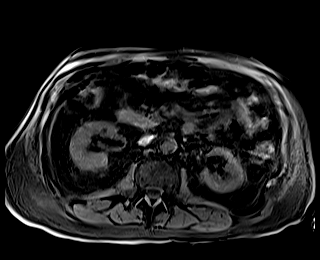
[im 34/80]
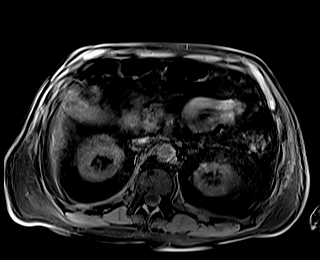
[im 46/80]
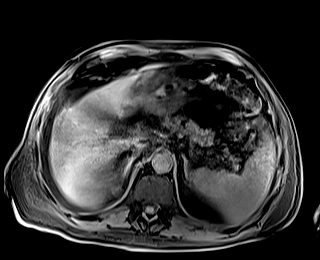
[im 57/80]
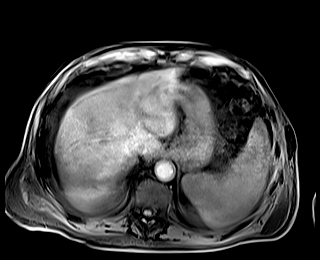
[im 68/80]
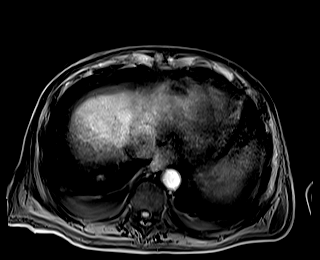
[im 80/80]
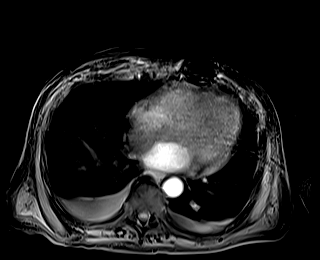

[47 of 48 positions shown; findings below may reference images not displayed]

FINDINGS: COMBINED FINDINGS FOR BOTH MR ABDOMEN AND PELVIS

Lower chest: Not visualized

Hepatobiliary: Not visualized

Pancreas:  Not visualized

Spleen:  Not visualized

Adrenals/Urinary Tract: Nonvisualized. Inferior pole of both kidneys
are included within this exam. No hydronephrosis identified. Several
small cysts noted within the mid and inferior pole of the scratch
set several small cysts are partially visualized. There is a small
polypoid filling defect within the right inferior bladder wall
measuring 3 mm, image [DATE].

Stomach/Bowel: No dilated bowel loops. The visualized bowel loops
are normal in caliber. No signs of bowel wall thickening.

Vascular/Lymphatic: Aortic atherosclerosis. No abdominopelvic
adenopathy.

Reproductive: Prostate gland enlargement.

Other: Again seen is a large, heterogeneous mass containing
calcifications involving the inferior right iliopsoas muscle. This
measures 11.4 by 8.2 by 8.9 cm. There is heterogeneous increased T2
signal and fairly homogeneous T1 isointense signal. This is
incompletely characterized without IV contrast material. Although
similar in size to the study from 05/06/2021 there has been
significant interval increase in size of mass compared with
01/22/2021.

Small volume of free fluid noted within the dependent pelvis.
Diffuse soft tissue edema is identified compatible with anasarca.

Musculoskeletal: Heterogeneous appearance of the bone marrow is
nonspecific.
IMPRESSION: 1. Large heterogeneous mass containing calcifications involving the
inferior right iliopsoas muscle is similar in size to the study from
05/06/2021. This is incompletely characterized without IV contrast
material. Differential considerations include benign and malignant
neoplasm. Alternatively, a chronic hematoma may have a similar
appearance. In the absence of signs or symptoms of infection,
abscess is considered less favored. More definitive characterization
with contrast enhanced cross-sectional imaging is recommended to
assess the enhancement characteristics of this indeterminate mass.
In a patient who may have difficulty with breath hold technique
and/or remaining motionless, a CT of the pelvis without and with
contrast material is recommended.
2. Small polypoid filling defect within the right inferior bladder
wall measuring 3 mm. This may represent a small polyp or mass.
3. Small volume of free fluid within the dependent pelvis.
4. Diffuse soft tissue edema compatible with anasarca.

## 2022-05-24 IMAGING — MR MR PELVIS W/O CM
4 of 5 series · 23 of 48 positions shown · non-contrast
Comparison: CT from 05/06/2021

CLINICAL DATA: Right lower quadrant mass

EXAM:
MRI ABDOMEN AND PELVIS WITHOUT CONTRAST
TECHNIQUE: Multiplanar multisequence MR imaging of the abdomen and pelvis was
performed. No intravenous contrast was administered.

[Series 2: T2 · coronal · 5.0mm · 1.56mm/px · 8 of 35 slices shown (1 of 3)]
[im 1/35]
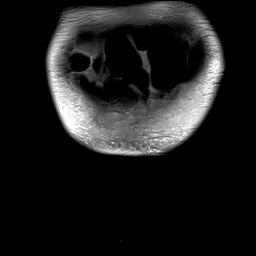
[im 5/35]
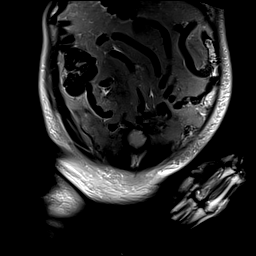
[im 10/35]
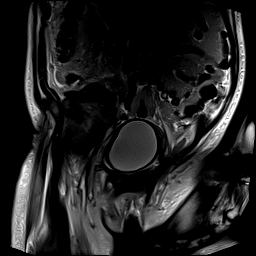
[im 15/35]
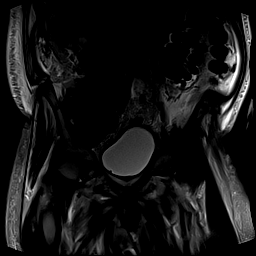
[im 20/35]
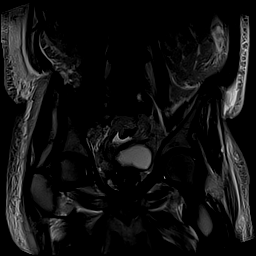
[im 25/35]
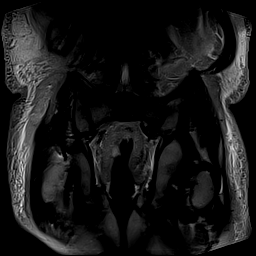
[im 30/35]
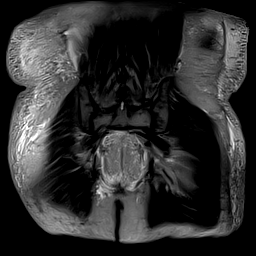
[im 35/35]
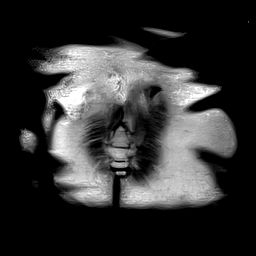

[Series 3: T2 · axial · 5.0mm · 0.51mm/px · z∈[-201,+33]mm · 8 of 40 slices shown (2 of 3)]
[im 1/40]
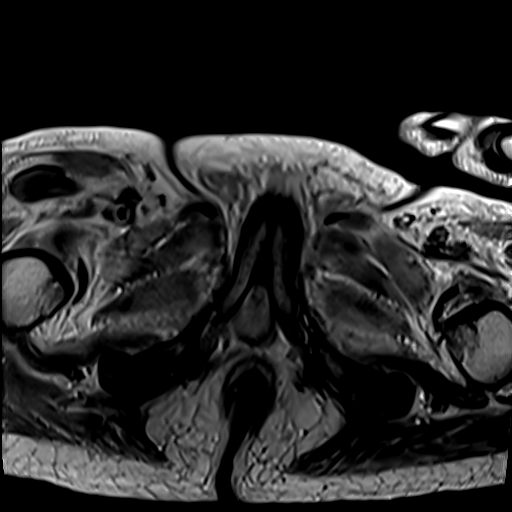
[im 5/40]
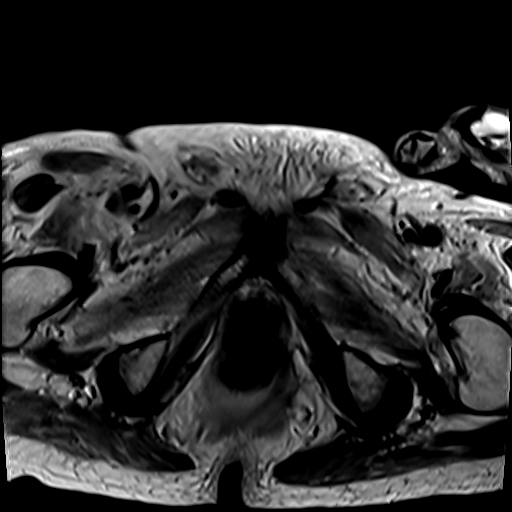
[im 14/40]
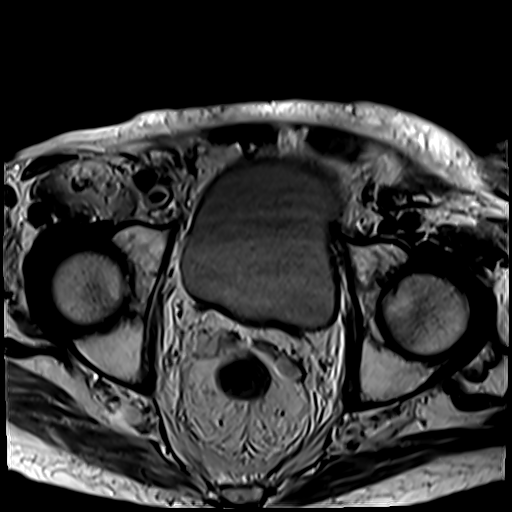
[im 18/40]
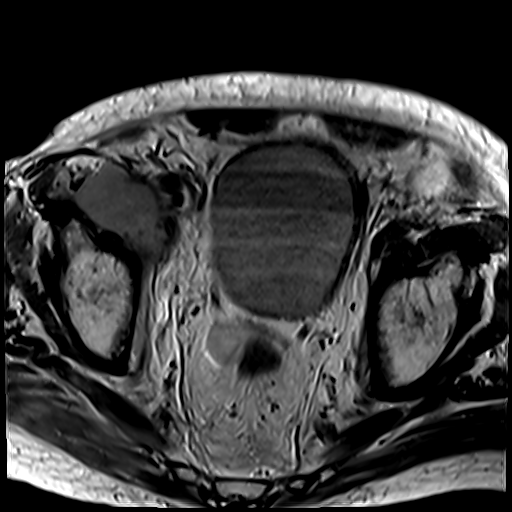
[im 22/40]
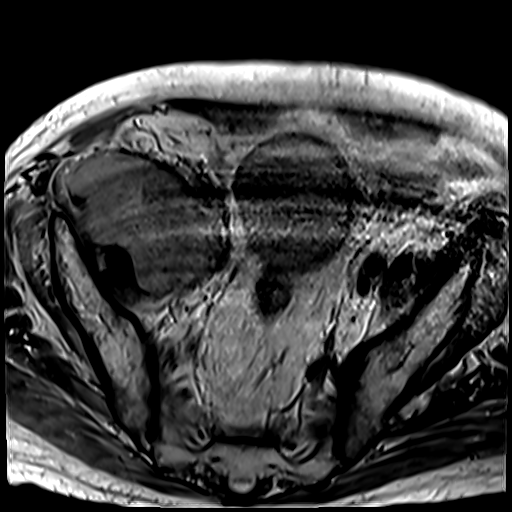
[im 27/40]
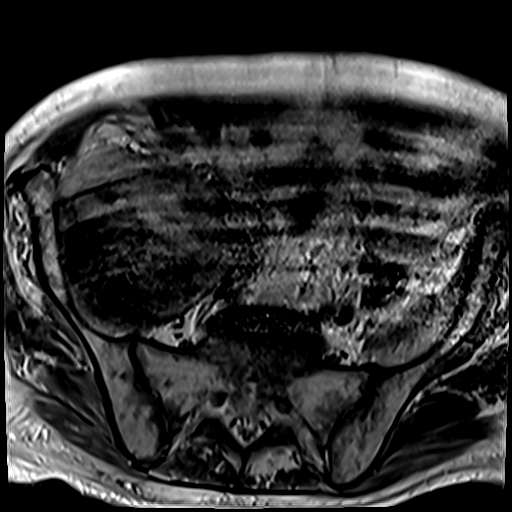
[im 35/40]
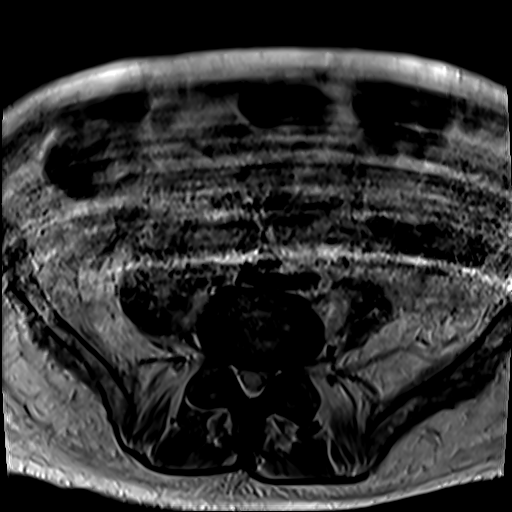
[im 40/40]
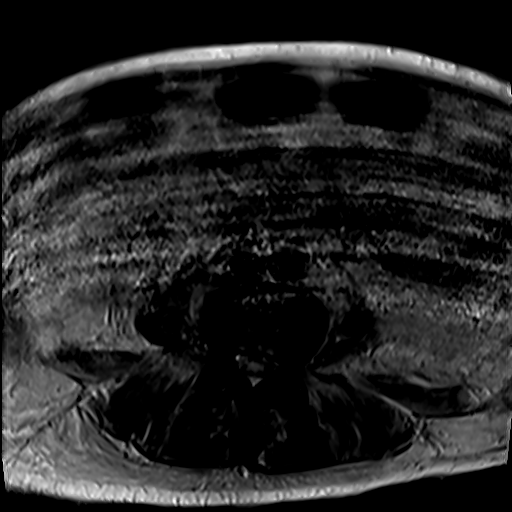

[Series 4: T2 fat-sat · axial · 5.0mm · 0.51mm/px · z∈[-201,+3]mm · 4 of 40 slices shown]
[im 1/40]
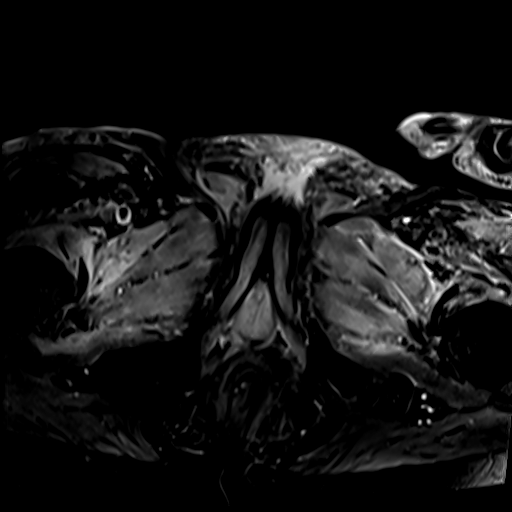
[im 5/40]
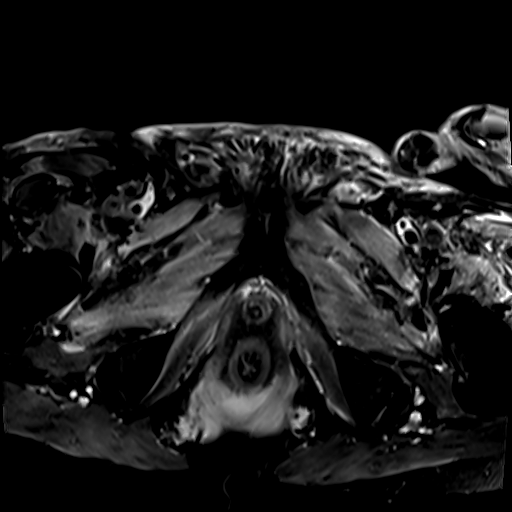
[im 22/40]
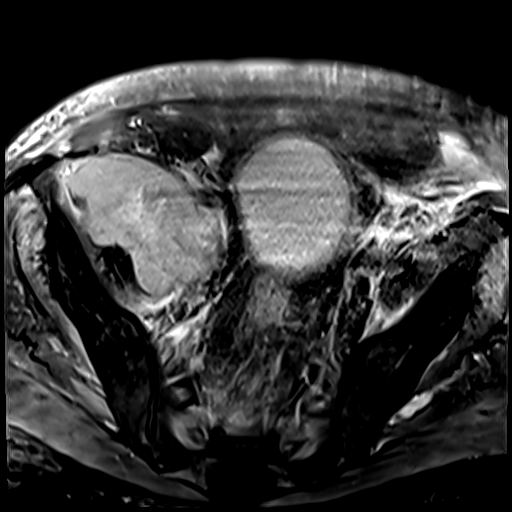
[im 35/40]
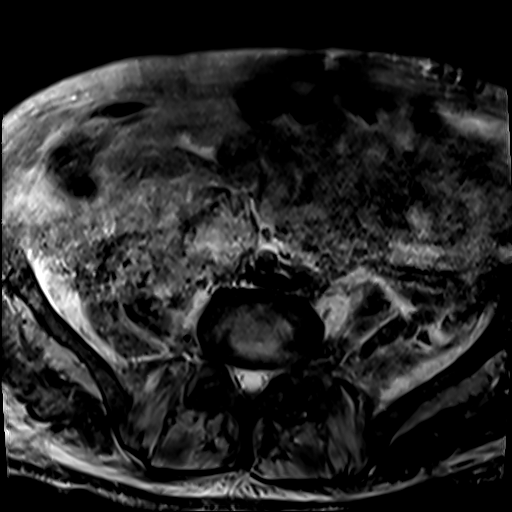

[Series 5: T2 · sagittal · 5.0mm · 0.55mm/px · 3 of 40 slices shown (3 of 3)]
[im 5/40]
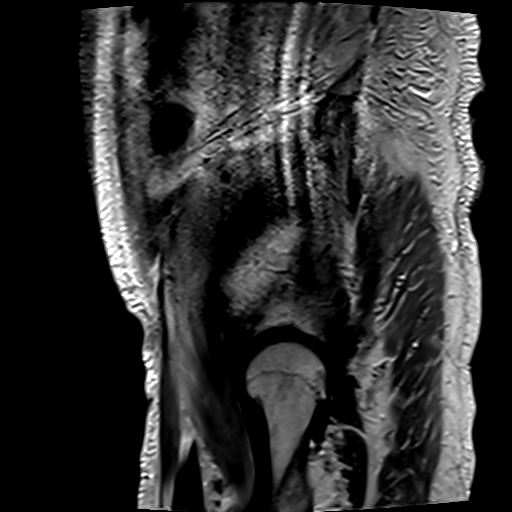
[im 22/40]
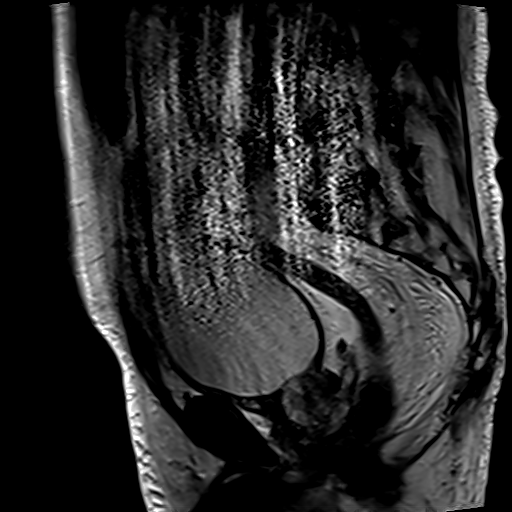
[im 35/40]
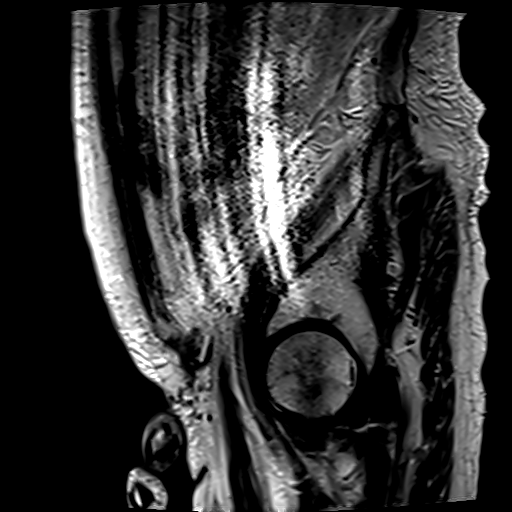

[23 of 48 positions shown; findings below may reference images not displayed]

FINDINGS: COMBINED FINDINGS FOR BOTH MR ABDOMEN AND PELVIS

Lower chest: Not visualized

Hepatobiliary: Not visualized

Pancreas:  Not visualized

Spleen:  Not visualized

Adrenals/Urinary Tract: Nonvisualized. Inferior pole of both kidneys
are included within this exam. No hydronephrosis identified. Several
small cysts noted within the mid and inferior pole of the scratch
set several small cysts are partially visualized. There is a small
polypoid filling defect within the right inferior bladder wall
measuring 3 mm, image [DATE].

Stomach/Bowel: No dilated bowel loops. The visualized bowel loops
are normal in caliber. No signs of bowel wall thickening.

Vascular/Lymphatic: Aortic atherosclerosis. No abdominopelvic
adenopathy.

Reproductive: Prostate gland enlargement.

Other: Again seen is a large, heterogeneous mass containing
calcifications involving the inferior right iliopsoas muscle. This
measures 11.4 by 8.2 by 8.9 cm. There is heterogeneous increased T2
signal and fairly homogeneous T1 isointense signal. This is
incompletely characterized without IV contrast material. Although
similar in size to the study from 05/06/2021 there has been
significant interval increase in size of mass compared with
01/22/2021.

Small volume of free fluid noted within the dependent pelvis.
Diffuse soft tissue edema is identified compatible with anasarca.

Musculoskeletal: Heterogeneous appearance of the bone marrow is
nonspecific.
IMPRESSION: 1. Large heterogeneous mass containing calcifications involving the
inferior right iliopsoas muscle is similar in size to the study from
05/06/2021. This is incompletely characterized without IV contrast
material. Differential considerations include benign and malignant
neoplasm. Alternatively, a chronic hematoma may have a similar
appearance. In the absence of signs or symptoms of infection,
abscess is considered less favored. More definitive characterization
with contrast enhanced cross-sectional imaging is recommended to
assess the enhancement characteristics of this indeterminate mass.
In a patient who may have difficulty with breath hold technique
and/or remaining motionless, a CT of the pelvis without and with
contrast material is recommended.
2. Small polypoid filling defect within the right inferior bladder
wall measuring 3 mm. This may represent a small polyp or mass.
3. Small volume of free fluid within the dependent pelvis.
4. Diffuse soft tissue edema compatible with anasarca.

## 2023-09-02 ENCOUNTER — Other Ambulatory Visit (HOSPITAL_COMMUNITY): Payer: Self-pay

## 2024-07-24 ENCOUNTER — Encounter (HOSPITAL_COMMUNITY): Payer: Self-pay | Admitting: Surgery
